# Patient Record
Sex: Female | Born: 1935 | Race: White | Hispanic: No | Smoking: Never smoker
Health system: Southern US, Community
[De-identification: ages and names within clinical notes are randomized; demographics above are authoritative.]

## PROBLEM LIST (undated history)

## (undated) DIAGNOSIS — T148XXA Other injury of unspecified body region, initial encounter: Secondary | ICD-10-CM

## (undated) DIAGNOSIS — E785 Hyperlipidemia, unspecified: Secondary | ICD-10-CM

## (undated) DIAGNOSIS — K219 Gastro-esophageal reflux disease without esophagitis: Secondary | ICD-10-CM

## (undated) DIAGNOSIS — I10 Essential (primary) hypertension: Secondary | ICD-10-CM

## (undated) DIAGNOSIS — D332 Benign neoplasm of brain, unspecified: Secondary | ICD-10-CM

## (undated) DIAGNOSIS — C801 Malignant (primary) neoplasm, unspecified: Secondary | ICD-10-CM

## (undated) DIAGNOSIS — K589 Irritable bowel syndrome without diarrhea: Secondary | ICD-10-CM

## (undated) DIAGNOSIS — E039 Hypothyroidism, unspecified: Secondary | ICD-10-CM

## (undated) DIAGNOSIS — F419 Anxiety disorder, unspecified: Secondary | ICD-10-CM

## (undated) HISTORY — DX: Other injury of unspecified body region, initial encounter: T14.8XXA

## (undated) HISTORY — PX: BREAST LUMPECTOMY: SHX2

## (undated) HISTORY — PX: TONSILLECTOMY: SUR1361

## (undated) HISTORY — PX: COLONOSCOPY: SHX174

## (undated) HISTORY — DX: Benign neoplasm of brain, unspecified: D33.2

## (undated) HISTORY — DX: Hyperlipidemia, unspecified: E78.5

## (undated) HISTORY — DX: Gastro-esophageal reflux disease without esophagitis: K21.9

## (undated) HISTORY — PX: OTHER SURGICAL HISTORY: SHX169

## (undated) HISTORY — PX: BREAST SURGERY: SHX581

## (undated) HISTORY — DX: Malignant (primary) neoplasm, unspecified: C80.1

## (undated) HISTORY — DX: Essential (primary) hypertension: I10

---

## 1981-10-02 HISTORY — PX: CYSTECTOMY: SUR359

## 1989-10-02 HISTORY — PX: THYROIDECTOMY: SHX17

## 2010-10-02 HISTORY — PX: BUNIONECTOMY: SHX129

## 2012-03-19 ENCOUNTER — Other Ambulatory Visit: Payer: Self-pay | Admitting: *Deleted

## 2012-03-19 DIAGNOSIS — R921 Mammographic calcification found on diagnostic imaging of breast: Secondary | ICD-10-CM

## 2012-03-25 ENCOUNTER — Ambulatory Visit
Admission: RE | Admit: 2012-03-25 | Discharge: 2012-03-25 | Disposition: A | Discharge status: Home / Self Care | Payer: Medicare Other | Source: Ambulatory Visit | Attending: *Deleted | Admitting: *Deleted

## 2012-03-25 DIAGNOSIS — R921 Mammographic calcification found on diagnostic imaging of breast: Secondary | ICD-10-CM

## 2012-03-29 ENCOUNTER — Other Ambulatory Visit: Payer: Self-pay | Admitting: *Deleted

## 2012-03-29 DIAGNOSIS — C50911 Malignant neoplasm of unspecified site of right female breast: Secondary | ICD-10-CM

## 2012-04-03 ENCOUNTER — Ambulatory Visit (HOSPITAL_COMMUNITY)
Admission: RE | Admit: 2012-04-03 | Discharge: 2012-04-03 | Disposition: A | Discharge status: Home / Self Care | Payer: Medicare Other | Source: Ambulatory Visit | Attending: *Deleted | Admitting: *Deleted

## 2012-04-03 DIAGNOSIS — C50911 Malignant neoplasm of unspecified site of right female breast: Secondary | ICD-10-CM

## 2012-04-03 DIAGNOSIS — R920 Mammographic microcalcification found on diagnostic imaging of breast: Secondary | ICD-10-CM | POA: Insufficient documentation

## 2012-04-03 DIAGNOSIS — D0590 Unspecified type of carcinoma in situ of unspecified breast: Secondary | ICD-10-CM | POA: Insufficient documentation

## 2012-04-03 MED ORDER — GADOBENATE DIMEGLUMINE 529 MG/ML IV SOLN
15.0000 mL | Freq: Once | INTRAVENOUS | Status: AC | PRN
  Administered 2012-04-03: 15 mL via INTRAVENOUS

## 2012-04-05 ENCOUNTER — Other Ambulatory Visit: Payer: Self-pay | Admitting: *Deleted

## 2012-04-05 DIAGNOSIS — R928 Other abnormal and inconclusive findings on diagnostic imaging of breast: Secondary | ICD-10-CM

## 2012-04-08 ENCOUNTER — Ambulatory Visit (INDEPENDENT_AMBULATORY_CARE_PROVIDER_SITE_OTHER): Payer: Medicare Other | Admitting: General Surgery

## 2012-04-08 ENCOUNTER — Encounter (INDEPENDENT_AMBULATORY_CARE_PROVIDER_SITE_OTHER): Payer: Self-pay | Admitting: General Surgery

## 2012-04-08 VITALS — BP 128/66 | HR 72 | Temp 97.4°F | Resp 16 | Ht 64.5 in | Wt 160.4 lb

## 2012-04-08 DIAGNOSIS — D0591 Unspecified type of carcinoma in situ of right breast: Secondary | ICD-10-CM

## 2012-04-08 DIAGNOSIS — D0590 Unspecified type of carcinoma in situ of unspecified breast: Secondary | ICD-10-CM

## 2012-04-08 NOTE — Progress Notes (Signed)
Patient ID: Angelica Grimes, female   DOB: 07/12/1935, 77 y.o.   MRN: [id number]  Chief Complaint  Patient presents with  . Breast Cancer    HPI Angelica Grimes is a 77 y.o. female.  Referred by Dr. Christiana Pellant HPI 61 yof who is otherwise healthy with history of what she thinks are multiple prior left breast biopsies who presents after undergoing screening mammogram with right sided calcifications.  She has no breast complaints prior to this.  She notes no mass except post biopsy hematoma.  She has since undergone an MR with results listed below.  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 She comes in today to discuss options.  Past Medical History  Diagnosis Date  . Hypertension   . Hyperlipidemia   . Cancer     breast  . Cancer     thyroid  . GERD (gastroesophageal reflux disease)   . Cough     Past Surgical History  Procedure Date  . Bunionectomy 2012    right foot  . Breast surgery 1966, 1991, 1998    biopsy  . Cystectomy 1983  . Thyroidectomy 1991    due to cancer    Family History  Problem Relation Age of Onset  . Stroke Mother   . Pneumonia Mother   . Cancer Father     bladder, prostate  . Cancer Sister     kidney  . Cancer Brother     lung  . Cancer Sister     kidney  . Cancer Sister     lung    Social History History  Substance Use Topics  . Smoking status: Never Smoker   . Smokeless tobacco: Never Used  . Alcohol Use: Yes     rare    Allergies  Allergen Reactions  . Aspirin Nausea Only and Other (See Comments)    Tightness of chest  . Benazepril Swelling    Entire face, lips, throat  . Penicillins Hives, Itching and Rash    Abdominal area only  . Clonidine Derivatives Other (See Comments)    Dryness of mouth, lowers blood pressure    Current Outpatient Prescriptions  Medication Sig Dispense Refill  . atorvastatin (LIPITOR) 20 MG tablet 20 mg daily.       Marland Kitchen BYSTOLIC 5 MG tablet 5 mg daily.        . hydrochlorothiazide (HYDRODIURIL) 25 MG tablet 25 mg daily.       Marland Kitchen omeprazole (PRILOSEC) 20 MG capsule       . potassium chloride SA (K-DUR,KLOR-CON) 20 MEQ tablet 20 mEq daily.       Marland Kitchen SYNTHROID 75 MCG tablet Daily.      . hydrALAZINE (APRESOLINE) 25 MG tablet 25 mg 2 (two) times daily.         Review of Systems Review of Systems  Constitutional: Negative for fever, chills and unexpected weight change.  HENT: Negative for hearing loss, congestion, sore throat, trouble swallowing and voice change.   Eyes: Negative for visual disturbance.  Respiratory: Positive for cough. Negative for wheezing.   Cardiovascular: Negative for chest pain, palpitations and leg swelling.  Gastrointestinal: Negative for nausea, vomiting, abdominal pain, diarrhea, constipation, blood in stool, abdominal distention and anal bleeding.  Genitourinary: Negative for hematuria, vaginal bleeding and difficulty urinating.  Musculoskeletal: Negative for arthralgias.  Skin: Negative for rash and wound.  Neurological: Negative for seizures, syncope and headaches.  Hematological: Negative for adenopathy. Does  not bruise/bleed easily.  Psychiatric/Behavioral: Negative for confusion.    Blood pressure 128/66, pulse 72, temperature 97.4 F (36.3 C), temperature source Temporal, resp. rate 16, height 5' 4.5" (1.638 m), weight 160 lb 6 oz (72.746 kg).  Physical Exam Physical Exam  Vitals reviewed. Constitutional: She appears well-developed and well-nourished.  Eyes: No scleral icterus.  Neck: Neck supple.  Cardiovascular: Normal rate, regular rhythm and normal heart sounds.   Pulmonary/Chest: Effort normal and breath sounds normal. She has no wheezes. She has no rales. Right breast exhibits no inverted nipple, no mass, no nipple discharge, no skin change and no tenderness. Left breast exhibits no inverted nipple, no mass, no nipple discharge, no skin change and no tenderness. Breasts are symmetrical.     Lymphadenopathy:    She has no cervical adenopathy.    Data Reviewed BILATERAL BREAST MRI WITH AND WITHOUT CONTRAST  Technique: Multiplanar, multisequence MR images of both breasts  were obtained prior to and following the intravenous administration  of 15ml of Multihance. Three dimensional images were evaluated at  the independent DynaCad workstation.  Comparison: Bilateral mammogram 03/07/2012 and right diagnostic  mammogram 03/19/2012. Stereotactic biopsy and post-biopsy clip  mammogram 03/25/2012.  Findings: There is a mild background parenchymal enhancement  pattern bilaterally. In the upper central/slightly medial right  breast, anterior to middle thirds is a post biopsy hematoma  measuring 2.4 cm greatest diameter. Biopsy clip artifact is seen  within the biopsy cavity. Clumped enhancement is seen adjacent to  the lateral margin of the biopsy clip, likely reflecting ductal  carcinoma in situ.  In the inferior central and slightly lateral right breast are a  few areas of clumped enhancement, similar to that seen adjacent to  the biopsy cavity. Some of the clumped enhancement is in the  anterior third of the inferior breast and the largest area of  enhancement, spanning approximately 9 mm greatest diameter is seen  in the middle to posterior thirds of the inferior central right  breast. Ductal carcinoma in situ in the inferior breast cannot be  excluded.  There are a few scattered foci of enhancement in the left breast.  No suspicious areas of enhancement to suggest malignancy identified  in the left breast.  No axillary or internal mammary chain lymphadenopathy.  In the dome of the liver is a circumscribed T-2 bright 8 mm lesion  which is statistically likely benign such as a hepatic cyst.  IMPRESSION:  1. Post-biopsy hematoma in the superior right breast at the site  of recent stereotactic biopsy. Small area of clumped enhancement  adjacent to the lateral margin of  the biopsy cavity likely reflects  the patient's biopsy-proven ductal carcinoma in situ (DCIS).  2. Scattered areas of clumped enhancement in the inferior right  breast. DCIS cannot be excluded.  3. No MRI evidence of malignancy in the left breast.  RECOMMENDATION:  MRI guided core needle biopsy of and area of enhancement the  inferior right breast is suggested. The patient will be contacted  to schedule biopsy.   Assessment    RIght breast DCIS    Plan    MR biopsy of other right breast lesion.  We discussed that biopsy of this lesion will direct [place] to most appropriate surgical therapy.  If benign she is good candidate for bct and if positive then I think mastectomy appropriate.  We discussed role of snbx and I will discuss at conference with pathology.  We discussed the staging and pathophysiology of breast cancer.  We discussed all of the different options for treatment for breast cancer including surgery, chemotherapy, radiation therapy, Herceptin, and antiestrogen therapy.   We discussed a sentinel lymph node biopsy as a possibility and what indications what be.  I don't know now if this will be necessary. We discussed the options for treatment of the breast cancer which included lumpectomy versus a mastectomy. We discussed the performance of the lumpectomy with a wire placement. We discussed a 10-20% chance of a positive margin requiring reexcision in the operating room. We also discussed that she may need radiation therapy or antiestrogen therapy or both if she undergoes lumpectomy. We discussed the mastectomy and the postoperative care for that as well. We discussed that there is no difference in her survival whether she undergoes lumpectomy with radiation therapy or antiestrogen therapy versus a mastectomy. There is a slight difference in the local recurrence rate being 3-5% with lumpectomy and about 1% with a mastectomy. We discussed the risks of operation including bleeding,  infection, possible reoperation. She understands her further therapy will be based on what her stages at the time of her operation.       Aalijah Lanphere 04/08/2012, 1:58 PM

## 2012-04-22 ENCOUNTER — Other Ambulatory Visit: Payer: Medicare Other

## 2012-04-22 ENCOUNTER — Ambulatory Visit
Admission: RE | Admit: 2012-04-22 | Discharge: 2012-04-22 | Disposition: A | Discharge status: Home / Self Care | Payer: Medicare Other | Source: Ambulatory Visit | Attending: *Deleted | Admitting: *Deleted

## 2012-04-22 DIAGNOSIS — R928 Other abnormal and inconclusive findings on diagnostic imaging of breast: Secondary | ICD-10-CM

## 2012-04-22 MED ORDER — GADOBENATE DIMEGLUMINE 529 MG/ML IV SOLN
15.0000 mL | Freq: Once | INTRAVENOUS | Status: AC | PRN
  Administered 2012-04-22: 15 mL via INTRAVENOUS

## 2012-04-23 ENCOUNTER — Telehealth (INDEPENDENT_AMBULATORY_CARE_PROVIDER_SITE_OTHER): Payer: Self-pay

## 2012-04-23 NOTE — Telephone Encounter (Signed)
Leigh w/[facility] calling to let us know that the pt's 2nd MRI guided bx of the right breast came back to be benign and the pt is aware. The pt just has the know area in the right breast with DCIS now. The pt wishes to go ahead and get scheduled for surgery now. The pt does not have a f/u appt with Dr Dwain Sarna. I will notify him and see what he advises.

## 2012-04-24 ENCOUNTER — Telehealth (INDEPENDENT_AMBULATORY_CARE_PROVIDER_SITE_OTHER): Payer: Self-pay

## 2012-04-24 ENCOUNTER — Other Ambulatory Visit (INDEPENDENT_AMBULATORY_CARE_PROVIDER_SITE_OTHER): Payer: Self-pay | Admitting: General Surgery

## 2012-04-24 DIAGNOSIS — D0510 Intraductal carcinoma in situ of unspecified breast: Secondary | ICD-10-CM

## 2012-04-24 NOTE — Telephone Encounter (Signed)
Called pt to let her know that Dr Dwain Sarna did go ahead and complete the surgical orders to get her scheduled for the right lumpectomy. The pt wishes to go ahead and schedule the surgery just not on Aug.2-7. Dr Dwain Sarna will call the pt on Friday to make sure she has no other questions regarding the surgery. The pt understands and waits to hear from our schedulers.

## 2012-05-13 MED ORDER — VANCOMYCIN HCL IN DEXTROSE 1-5 GM/200ML-% IV SOLN
INTRAVENOUS | Status: AC
  Filled 2012-05-13: qty 200

## 2012-05-15 ENCOUNTER — Encounter (HOSPITAL_BASED_OUTPATIENT_CLINIC_OR_DEPARTMENT_OTHER): Payer: Self-pay | Admitting: *Deleted

## 2012-05-15 NOTE — Progress Notes (Signed)
To come in for labs and ekg To bring all meds and overnight bag to stay [facility] since she lives in [place]. No cardiac -did have some bronchitis 5/13-better-not generally any resp problems-never smoked.

## 2012-05-17 ENCOUNTER — Other Ambulatory Visit: Payer: Self-pay

## 2012-05-17 ENCOUNTER — Encounter (HOSPITAL_BASED_OUTPATIENT_CLINIC_OR_DEPARTMENT_OTHER)
Admission: RE | Admit: 2012-05-17 | Discharge: 2012-05-17 | Disposition: A | Discharge status: Home / Self Care | Payer: Medicare Other | Source: Ambulatory Visit | Attending: General Surgery | Admitting: General Surgery

## 2012-05-17 LAB — CBC WITH DIFFERENTIAL/PLATELET
Basophils Absolute: 0 10*3/uL (ref 0.0–0.1)
Basophils Relative: 0 % (ref 0–1)
Eosinophils Absolute: 0.1 10*3/uL (ref 0.0–0.7)
HCT: 36.3 % (ref 36.0–46.0)
MCH: 29.2 pg (ref 26.0–34.0)
MCHC: 34.2 g/dL (ref 30.0–36.0)
Monocytes Absolute: 0.6 10*3/uL (ref 0.1–1.0)
Neutro Abs: 4.2 10*3/uL (ref 1.7–7.7)
Neutrophils Relative %: 58 % (ref 43–77)
RDW: 13.5 % (ref 11.5–15.5)

## 2012-05-17 LAB — BASIC METABOLIC PANEL
BUN: 11 mg/dL (ref 6–23)
Creatinine, Ser: 0.83 mg/dL (ref 0.50–1.10)
GFR calc Af Amer: 79 mL/min — ABNORMAL LOW (ref >90)
GFR calc non Af Amer: 68 mL/min — ABNORMAL LOW (ref >90)
Glucose, Bld: 102 mg/dL — ABNORMAL HIGH (ref 70–99)

## 2012-05-17 NOTE — Progress Notes (Signed)
EKG reviewed with Dr. Gelene Mink when completed.  OK to proceed without additional testing.

## 2012-05-21 ENCOUNTER — Encounter (HOSPITAL_BASED_OUTPATIENT_CLINIC_OR_DEPARTMENT_OTHER): Payer: Self-pay | Admitting: Anesthesiology

## 2012-05-21 ENCOUNTER — Ambulatory Visit
Admission: RE | Admit: 2012-05-21 | Discharge: 2012-05-21 | Disposition: A | Discharge status: Home / Self Care | Payer: Medicare Other | Source: Ambulatory Visit | Attending: General Surgery | Admitting: General Surgery

## 2012-05-21 ENCOUNTER — Ambulatory Visit (HOSPITAL_BASED_OUTPATIENT_CLINIC_OR_DEPARTMENT_OTHER)
Admission: RE | Admit: 2012-05-21 | Discharge: 2012-05-22 | Disposition: A | Discharge status: Home / Self Care | Payer: Medicare Other | Source: Ambulatory Visit | Attending: General Surgery | Admitting: General Surgery

## 2012-05-21 ENCOUNTER — Encounter (HOSPITAL_BASED_OUTPATIENT_CLINIC_OR_DEPARTMENT_OTHER)
Admission: RE | Disposition: A | Discharge status: Home / Self Care | Payer: Self-pay | Source: Ambulatory Visit | Attending: General Surgery

## 2012-05-21 ENCOUNTER — Encounter (HOSPITAL_BASED_OUTPATIENT_CLINIC_OR_DEPARTMENT_OTHER): Payer: Self-pay | Admitting: *Deleted

## 2012-05-21 ENCOUNTER — Ambulatory Visit (HOSPITAL_BASED_OUTPATIENT_CLINIC_OR_DEPARTMENT_OTHER): Payer: Medicare Other | Admitting: Anesthesiology

## 2012-05-21 ENCOUNTER — Encounter (HOSPITAL_BASED_OUTPATIENT_CLINIC_OR_DEPARTMENT_OTHER): Payer: Self-pay | Admitting: Certified Registered"

## 2012-05-21 DIAGNOSIS — Z85850 Personal history of malignant neoplasm of thyroid: Secondary | ICD-10-CM | POA: Insufficient documentation

## 2012-05-21 DIAGNOSIS — D0590 Unspecified type of carcinoma in situ of unspecified breast: Secondary | ICD-10-CM | POA: Insufficient documentation

## 2012-05-21 DIAGNOSIS — E039 Hypothyroidism, unspecified: Secondary | ICD-10-CM | POA: Insufficient documentation

## 2012-05-21 DIAGNOSIS — Z01812 Encounter for preprocedural laboratory examination: Secondary | ICD-10-CM | POA: Insufficient documentation

## 2012-05-21 DIAGNOSIS — D0510 Intraductal carcinoma in situ of unspecified breast: Secondary | ICD-10-CM

## 2012-05-21 DIAGNOSIS — K219 Gastro-esophageal reflux disease without esophagitis: Secondary | ICD-10-CM | POA: Insufficient documentation

## 2012-05-21 DIAGNOSIS — I10 Essential (primary) hypertension: Secondary | ICD-10-CM | POA: Insufficient documentation

## 2012-05-21 DIAGNOSIS — Z01810 Encounter for preprocedural cardiovascular examination: Secondary | ICD-10-CM | POA: Insufficient documentation

## 2012-05-21 HISTORY — DX: Irritable bowel syndrome, unspecified: K58.9

## 2012-05-21 LAB — GLUCOSE, CAPILLARY: Glucose-Capillary: 209 mg/dL — ABNORMAL HIGH (ref 70–99)

## 2012-05-21 SURGERY — BREAST LUMPECTOMY WITH NEEDLE LOCALIZATION
Anesthesia: General | Site: Breast | Laterality: Right | Wound class: Clean

## 2012-05-21 MED ORDER — METOCLOPRAMIDE HCL 5 MG/ML IJ SOLN: 10.0000 mg | Freq: Once | INTRAMUSCULAR | Status: AC | PRN

## 2012-05-21 MED ORDER — NEBIVOLOL HCL 5 MG PO TABS: 5.0000 mg | ORAL_TABLET | Freq: Every day | ORAL | Status: DC

## 2012-05-21 MED ORDER — DEXAMETHASONE SODIUM PHOSPHATE 4 MG/ML IJ SOLN
INTRAMUSCULAR | Status: DC | PRN
  Administered 2012-05-21: 10 mg via INTRAVENOUS

## 2012-05-21 MED ORDER — MORPHINE SULFATE 2 MG/ML IJ SOLN: 2.0000 mg | INTRAMUSCULAR | Status: DC | PRN

## 2012-05-21 MED ORDER — OXYCODONE HCL 5 MG PO TABS
5.0000 mg | ORAL_TABLET | Freq: Once | ORAL | Status: AC | PRN
  Administered 2012-05-21: 5 mg via ORAL

## 2012-05-21 MED ORDER — SODIUM CHLORIDE 0.9 % IV SOLN
INTRAVENOUS | Status: DC
  Administered 2012-05-21: 14:00:00 via INTRAVENOUS

## 2012-05-21 MED ORDER — CIPROFLOXACIN IN D5W 400 MG/200ML IV SOLN
400.0000 mg | INTRAVENOUS | Status: AC
  Administered 2012-05-21: 400 mg via INTRAVENOUS

## 2012-05-21 MED ORDER — HYDROMORPHONE HCL PF 1 MG/ML IJ SOLN: 0.2500 mg | INTRAMUSCULAR | Status: DC | PRN

## 2012-05-21 MED ORDER — FENTANYL CITRATE 0.05 MG/ML IJ SOLN
INTRAMUSCULAR | Status: DC | PRN
  Administered 2012-05-21: 50 ug via INTRAVENOUS
  Administered 2012-05-21: 25 ug via INTRAVENOUS

## 2012-05-21 MED ORDER — LACTATED RINGERS IV SOLN
INTRAVENOUS | Status: DC
  Administered 2012-05-21: 11:00:00 via INTRAVENOUS

## 2012-05-21 MED ORDER — HYDRALAZINE HCL 25 MG PO TABS: 25.0000 mg | ORAL_TABLET | Freq: Two times a day (BID) | ORAL | Status: DC

## 2012-05-21 MED ORDER — LIDOCAINE HCL (CARDIAC) 20 MG/ML IV SOLN
INTRAVENOUS | Status: DC | PRN
  Administered 2012-05-21: 50 mg via INTRAVENOUS

## 2012-05-21 MED ORDER — ONDANSETRON HCL 4 MG/2ML IJ SOLN: 4.0000 mg | Freq: Four times a day (QID) | INTRAMUSCULAR | Status: DC | PRN

## 2012-05-21 MED ORDER — METOCLOPRAMIDE HCL 5 MG/ML IJ SOLN
INTRAMUSCULAR | Status: DC | PRN
  Administered 2012-05-21: 10 mg via INTRAVENOUS

## 2012-05-21 MED ORDER — MIDAZOLAM HCL 5 MG/5ML IJ SOLN
INTRAMUSCULAR | Status: DC | PRN
  Administered 2012-05-21: 1 mg via INTRAVENOUS

## 2012-05-21 MED ORDER — ACETAMINOPHEN 650 MG RE SUPP: 650.0000 mg | Freq: Four times a day (QID) | RECTAL | Status: DC | PRN

## 2012-05-21 MED ORDER — OXYCODONE HCL 5 MG PO TABS: 5.0000 mg | ORAL_TABLET | ORAL | Status: DC | PRN

## 2012-05-21 MED ORDER — OXYCODONE HCL 5 MG/5ML PO SOLN: 5.0000 mg | Freq: Once | ORAL | Status: AC | PRN

## 2012-05-21 MED ORDER — ACETAMINOPHEN 325 MG PO TABS: 650.0000 mg | ORAL_TABLET | Freq: Four times a day (QID) | ORAL | Status: DC | PRN

## 2012-05-21 MED ORDER — HYDROCHLOROTHIAZIDE 25 MG PO TABS: 25.0000 mg | ORAL_TABLET | Freq: Every day | ORAL | Status: DC

## 2012-05-21 MED ORDER — ACETAMINOPHEN 10 MG/ML IV SOLN
1000.0000 mg | Freq: Once | INTRAVENOUS | Status: AC
  Administered 2012-05-21: 1000 mg via INTRAVENOUS

## 2012-05-21 MED ORDER — ONDANSETRON HCL 4 MG/2ML IJ SOLN
INTRAMUSCULAR | Status: DC | PRN
  Administered 2012-05-21: 4 mg via INTRAVENOUS

## 2012-05-21 MED ORDER — BUPIVACAINE HCL (PF) 0.25 % IJ SOLN
INTRAMUSCULAR | Status: DC | PRN
  Administered 2012-05-21: 20 mL

## 2012-05-21 MED ORDER — PROPOFOL 10 MG/ML IV EMUL
INTRAVENOUS | Status: DC | PRN
  Administered 2012-05-21: 200 mg via INTRAVENOUS

## 2012-05-21 SURGICAL SUPPLY — 53 items
APPLIER CLIP 9.375 MED OPEN (MISCELLANEOUS)
BENZOIN TINCTURE PRP APPL 2/3 (GAUZE/BANDAGES/DRESSINGS) ×2 IMPLANT
BINDER BREAST LRG (GAUZE/BANDAGES/DRESSINGS) ×2 IMPLANT
BINDER BREAST MEDIUM (GAUZE/BANDAGES/DRESSINGS) IMPLANT
BINDER BREAST XLRG (GAUZE/BANDAGES/DRESSINGS) IMPLANT
BINDER BREAST XXLRG (GAUZE/BANDAGES/DRESSINGS) IMPLANT
BLADE SURG 15 STRL LF DISP TIS (BLADE) ×1 IMPLANT
BLADE SURG 15 STRL SS (BLADE) ×1
CANISTER SUCTION 1200CC (MISCELLANEOUS) IMPLANT
CHLORAPREP W/TINT 26ML (MISCELLANEOUS) ×2 IMPLANT
CLIP APPLIE 9.375 MED OPEN (MISCELLANEOUS) IMPLANT
CLOTH BEACON ORANGE TIMEOUT ST (SAFETY) ×2 IMPLANT
COVER MAYO STAND STRL (DRAPES) ×2 IMPLANT
COVER TABLE BACK 60X90 (DRAPES) ×2 IMPLANT
DECANTER SPIKE VIAL GLASS SM (MISCELLANEOUS) IMPLANT
DERMABOND ADVANCED (GAUZE/BANDAGES/DRESSINGS)
DERMABOND ADVANCED .7 DNX12 (GAUZE/BANDAGES/DRESSINGS) IMPLANT
DEVICE DUBIN W/COMP PLATE 8390 (MISCELLANEOUS) ×2 IMPLANT
DRAPE PED LAPAROTOMY (DRAPES) ×2 IMPLANT
DRSG TEGADERM 4X4.75 (GAUZE/BANDAGES/DRESSINGS) ×2 IMPLANT
ELECT COATED BLADE 2.86 ST (ELECTRODE) ×2 IMPLANT
ELECT REM PT RETURN 9FT ADLT (ELECTROSURGICAL) ×2
ELECTRODE REM PT RTRN 9FT ADLT (ELECTROSURGICAL) ×1 IMPLANT
GAUZE SPONGE 4X4 12PLY STRL LF (GAUZE/BANDAGES/DRESSINGS) ×2 IMPLANT
GLOVE BIO SURGEON STRL SZ7 (GLOVE) ×4 IMPLANT
GLOVE BIOGEL PI IND STRL 7.5 (GLOVE) ×1 IMPLANT
GLOVE BIOGEL PI INDICATOR 7.5 (GLOVE) ×1
GLOVE SKINSENSE NS SZ7.0 (GLOVE) ×1
GLOVE SKINSENSE STRL SZ7.0 (GLOVE) ×1 IMPLANT
GOWN PREVENTION PLUS XLARGE (GOWN DISPOSABLE) ×4 IMPLANT
KIT MARKER MARGIN INK (KITS) ×2 IMPLANT
NEEDLE HYPO 25X1 1.5 SAFETY (NEEDLE) ×2 IMPLANT
NS IRRIG 1000ML POUR BTL (IV SOLUTION) ×2 IMPLANT
PACK BASIN DAY SURGERY FS (CUSTOM PROCEDURE TRAY) ×2 IMPLANT
PENCIL BUTTON HOLSTER BLD 10FT (ELECTRODE) ×2 IMPLANT
SLEEVE SCD COMPRESS KNEE MED (MISCELLANEOUS) ×2 IMPLANT
SPONGE LAP 4X18 X RAY DECT (DISPOSABLE) ×2 IMPLANT
STRIP CLOSURE SKIN 1/2X4 (GAUZE/BANDAGES/DRESSINGS) ×2 IMPLANT
SUT MNCRL AB 4-0 PS2 18 (SUTURE) IMPLANT
SUT MON AB 5-0 PS2 18 (SUTURE) IMPLANT
SUT SILK 2 0 SH (SUTURE) IMPLANT
SUT VIC AB 2-0 SH 27 (SUTURE) ×1
SUT VIC AB 2-0 SH 27XBRD (SUTURE) ×1 IMPLANT
SUT VIC AB 3-0 SH 27 (SUTURE) ×1
SUT VIC AB 3-0 SH 27X BRD (SUTURE) ×1 IMPLANT
SUT VIC AB 5-0 PS2 18 (SUTURE) ×2 IMPLANT
SUT VICRYL AB 3 0 TIES (SUTURE) IMPLANT
SYR CONTROL 10ML LL (SYRINGE) ×2 IMPLANT
TOWEL OR 17X24 6PK STRL BLUE (TOWEL DISPOSABLE) ×2 IMPLANT
TOWEL OR NON WOVEN STRL DISP B (DISPOSABLE) ×2 IMPLANT
TUBE CONNECTING 20X1/4 (TUBING) IMPLANT
WATER STERILE IRR 1000ML POUR (IV SOLUTION) ×2 IMPLANT
YANKAUER SUCT BULB TIP NO VENT (SUCTIONS) IMPLANT

## 2012-05-21 NOTE — Anesthesia Postprocedure Evaluation (Signed)
Anesthesia Post Note  Patient: Angelica Grimes  Procedure(s) Performed: Procedure(s) (LRB): BREAST LUMPECTOMY WITH NEEDLE LOCALIZATION (Right)  Anesthesia type: General  Patient location: PACU  Post pain: Pain level controlled  Post assessment: Patient's Cardiovascular Status Stable  Last Vitals:  Filed Vitals:   05/21/12 1358  BP: 170/77  Pulse: 63  Temp: 36.8 C  Resp: 18    Post vital signs: Reviewed and stable  Level of consciousness: alert  Complications: No apparent anesthesia complications

## 2012-05-21 NOTE — H&P (Signed)
HPI  46 yof who is otherwise healthy with history of what Angelica Grimes thinks are multiple prior left breast biopsies who presents after undergoing screening mammogram with right sided calcifications. Angelica Grimes has no breast complaints prior to this. Angelica Grimes notes no mass except post biopsy hematoma. Angelica Grimes has since undergone an MR with results listed below. 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 Angelica Grimes has undergone additional MR biopsy that is benign.  We have discussed options and decided to proceed with lumpectomy.  Past Medical History   Diagnosis  Date   .  Hypertension    .  Hyperlipidemia    .  Cancer      breast   .  Cancer      thyroid   .  GERD (gastroesophageal reflux disease)    .  Cough     Past Surgical History   Procedure  Date   .  Bunionectomy  2012     right foot   .  Breast surgery  1966, 1991, 1998     biopsy   .  Cystectomy  1983   .  Thyroidectomy  1991     due to cancer    Social History  History   Substance Use Topics   .  Smoking status:  Never Smoker   .  Smokeless tobacco:  Never Used   .  Alcohol Use:  Yes      rare    Allergies   Allergen  Reactions   .  Aspirin  Nausea Only and Other (See Comments)     Tightness of chest   .  Benazepril  Swelling     Entire face, lips, throat   .  Penicillins  Hives, Itching and Rash     Abdominal area only   .  Clonidine Derivatives  Other (See Comments)     Dryness of mouth, lowers blood pressure    Current Outpatient Prescriptions   Medication  Sig  Dispense  Refill   .  atorvastatin (LIPITOR) 20 MG tablet  20 mg daily.     Marland Kitchen  BYSTOLIC 5 MG tablet  5 mg daily.     .  hydrochlorothiazide (HYDRODIURIL) 25 MG tablet  25 mg daily.     Marland Kitchen  omeprazole (PRILOSEC) 20 MG capsule      .  potassium chloride SA (K-DUR,KLOR-CON) 20 MEQ tablet  20 mEq daily.     Marland Kitchen  SYNTHROID 75 MCG tablet  Daily.     .  hydrALAZINE (APRESOLINE) 25 MG tablet  25 mg 2 (two) times daily.       Blood pressure  128/66, pulse 72, temperature 97.4 F (36.3 C), temperature source Temporal, resp. rate 16, height 5' 4.5" (1.638 m), weight 160 lb 6 oz (72.746 kg).  Physical Exam  Physical Exam  Vitals reviewed.  Constitutional: Angelica Grimes appears well-developed and well-nourished.   Right breast exhibits no inverted nipple, no mass, no nipple discharge, no skin change and no tenderness. Left breast exhibits no inverted nipple, no mass, no nipple discharge, no skin change and no tenderness. Breasts are symmetrical.    Data Reviewed  BILATERAL BREAST MRI WITH AND WITHOUT CONTRAST  Technique: Multiplanar, multisequence MR images of both breasts  were obtained prior to and following the intravenous administration  of 15ml of Multihance. Three dimensional images were evaluated at  the independent DynaCad workstation.  Comparison: Bilateral mammogram 03/07/2012 and right diagnostic  mammogram 03/19/2012. Stereotactic biopsy  and post-biopsy clip  mammogram 03/25/2012.  Findings: There is a mild background parenchymal enhancement  pattern bilaterally. In the upper central/slightly medial right  breast, anterior to middle thirds is a post biopsy hematoma  measuring 2.4 cm greatest diameter. Biopsy clip artifact is seen  within the biopsy cavity. Clumped enhancement is seen adjacent to  the lateral margin of the biopsy clip, likely reflecting ductal  carcinoma in situ.  In the inferior central and slightly lateral right breast are a  few areas of clumped enhancement, similar to that seen adjacent to  the biopsy cavity. Some of the clumped enhancement is in the  anterior third of the inferior breast and the largest area of  enhancement, spanning approximately 9 mm greatest diameter is seen  in the middle to posterior thirds of the inferior central right  breast. Ductal carcinoma in situ in the inferior breast cannot be  excluded.  There are a few scattered foci of enhancement in the left breast.  No suspicious  areas of enhancement to suggest malignancy identified  in the left breast.  No axillary or internal mammary chain lymphadenopathy.  In the dome of the liver is a circumscribed T-2 bright 8 mm lesion  which is statistically likely benign such as a hepatic cyst.  IMPRESSION:  1. Post-biopsy hematoma in the superior right breast at the site  of recent stereotactic biopsy. Small area of clumped enhancement  adjacent to the lateral margin of the biopsy cavity likely reflects  the patient's biopsy-proven ductal carcinoma in situ (DCIS).  2. Scattered areas of clumped enhancement in the inferior right  breast. DCIS cannot be excluded.  3. No MRI evidence of malignancy in the left breast.  RECOMMENDATION:  MRI guided core needle biopsy of and area of enhancement the  inferior right breast is suggested. The patient will be contacted  to schedule biopsy.  Assessment   RIght breast DCIS   Plan   Right breast lumpectomy. We discussed the options for treatment of the breast cancer which included lumpectomy versus a mastectomy. We discussed the performance of the lumpectomy with a wire placement. We discussed a 10-20% chance of a positive margin requiring reexcision in the operating room. We also discussed that Angelica Grimes may need radiation therapy or antiestrogen therapy or both if Angelica Grimes undergoes lumpectomy. We discussed the mastectomy and the postoperative care for that as well. We discussed that there is no difference in her survival whether Angelica Grimes undergoes lumpectomy with radiation therapy or antiestrogen therapy versus a mastectomy. There is a slight difference in the local recurrence rate being 3-5% with lumpectomy and about 1% with a mastectomy. We discussed the risks of operation including bleeding, infection, possible reoperation. Angelica Grimes understands her further therapy will be based on what her stages at the time of her operation.

## 2012-05-21 NOTE — Op Note (Signed)
Preoperative diagnosis: Stage 0 right breast cancer Postoperative diagnosis: Same as above Procedure: Right breast wire-guided lumpectomy Surgeon: Dr. Harden Mo Anesthesia: Gen. Estimated blood loss: Minimal Specimens: Right breast tissue marked with paint,  additional anterior margin unmarked Complications: None Drains: None Sponge needle count was correct x2 at end of operation Disposition to recovery in stable condition  Indications: This is a 77 year old female who presented with a mammographic abnormality. She underwent a biopsy showed high-grade ductal carcinoma in situ. She was sent for an MRI that showed another abnormality on that same side. It also showed the postbiopsy hematoma. This other area underwent biopsy showing benign breast tissue it was recommended to follow up in 6 months. The patient I discussed all of her options and decided to proceed with a lumpectomy.  Procedure: After informed consent was obtained the patient was taken to the operating room. She first had a wire placed by Dr. Deboraha Sprang by the breast center. She was then administered 400 mg of IV ciprofloxacin. I had the mammograms available for my review in the operating room. She had sequential compression devices placed on her legs. She was in placed under general anesthesia with an LMA. Her right breast was prepped and draped in the standard sterile surgical fashion. A surgical timeout was performed.  I then made a curvilinear incision overlying the tract of the wire. I then used cautery to dissect underneath the skin to bring the wire in remotely. The anterior margin of this is the skin. I then used cautery to remove the wire and the surrounding breast tissue. This was not down to the pectoralis fascia as I thought I was clear of that. I then took this out and marked it with paint. A Faxitron mammogram was taken confirming removal of the lesion and the clip. I did again take some more anterior margin so her anterior  margin is a linear skin incision were tissue to remove in this location except for skin. This was confirmed by radiology. I placed 2 clips deep. I placed one clip in each position of the cavity. Hemostasis was observed. I then closed with 2-0 Vicryl, 3-0 Vicryl, and 4-0 Monocryl. I then infiltrated 20 cc of quarter percent Marcaine. Steri-Strips and a sterile dressing were placed. A breast binder was placed. She tolerated this well transferred recovery stable.

## 2012-05-21 NOTE — Interval H&P Note (Signed)
History and Physical Interval Note:  05/21/2012 11:35 AM  Angelica Grimes  has presented today for surgery, with the diagnosis of remove right breast cancer  The various methods of treatment have been discussed with the patient and family. After consideration of risks, benefits and other options for treatment, the patient has consented to  Procedure(s) (LRB): BREAST LUMPECTOMY WITH NEEDLE LOCALIZATION (Right) as a surgical intervention .  The patient's history has been reviewed, patient examined, no change in status, stable for surgery.  I have reviewed the patient's chart and labs.  Questions were answered to the patient's satisfaction.     Annalucia Laino

## 2012-05-21 NOTE — Transfer of Care (Signed)
Immediate Anesthesia Transfer of Care Note  Patient: Angelica Grimes  Procedure(s) Performed: Procedure(s) (LRB): BREAST LUMPECTOMY WITH NEEDLE LOCALIZATION (Right)  Patient Location: PACU  Anesthesia Type: General  Level of Consciousness: awake, alert , oriented and patient cooperative  Airway & Oxygen Therapy: Patient Spontanous Breathing and Patient connected to face mask oxygen  Post-op Assessment: Report given to PACU RN and Post -op Vital signs reviewed and stable  Post vital signs: Reviewed and stable  Complications: No apparent anesthesia complications

## 2012-05-21 NOTE — Anesthesia Preprocedure Evaluation (Signed)
Anesthesia Evaluation  Patient identified by MRN, date of birth, ID band Patient awake    Reviewed: Allergy & Precautions, H&P , NPO status , Patient's Chart, lab work & pertinent test results, reviewed documented beta blocker date and time   Airway Mallampati: II TM Distance: >3 FB Neck ROM: full    Dental   Pulmonary neg pulmonary ROS,  breath sounds clear to auscultation        Cardiovascular hypertension, On Medications and On Home Beta Blockers Rhythm:regular     Neuro/Psych negative neurological ROS  negative psych ROS   GI/Hepatic Neg liver ROS, GERD-  Medicated and Controlled,  Endo/Other  negative endocrine ROSHypothyroidism   Renal/GU negative Renal ROS  negative genitourinary   Musculoskeletal   Abdominal   Peds  Hematology negative hematology ROS (+)   Anesthesia Other Findings See surgeon's H&P   Reproductive/Obstetrics negative OB ROS                           Anesthesia Physical Anesthesia Plan  ASA: III  Anesthesia Plan: General   Post-op Pain Management:    Induction: Intravenous  Airway Management Planned: LMA  Additional Equipment:   Intra-op Plan:   Post-operative Plan: Extubation in OR  Informed Consent: I have reviewed the patients History and Physical, chart, labs and discussed the procedure including the risks, benefits and alternatives for the proposed anesthesia with the patient or authorized representative who has indicated his/her understanding and acceptance.   Dental Advisory Given  Plan Discussed with: CRNA and Surgeon  Anesthesia Plan Comments:         Anesthesia Quick Evaluation

## 2012-05-22 LAB — GLUCOSE, CAPILLARY: Glucose-Capillary: 118 mg/dL — ABNORMAL HIGH (ref 70–99)

## 2012-05-22 MED ORDER — OXYCODONE-ACETAMINOPHEN 5-325 MG PO TABS: 1.0000 | ORAL_TABLET | ORAL | Status: AC | PRN

## 2012-05-22 NOTE — Discharge Summary (Signed)
Physician Discharge Summary  Patient ID: Angelica Grimes MRN: [id number] DOB/AGE: 04/15/1935 77 y.o.  Admit date: 05/21/2012 Discharge date: 05/22/2012  Admission Diagnoses: Hypertension DCIS Discharge Diagnoses:  Same as above  Discharged Condition: good  Hospital Course: 30 yof admitted after lumpectomy for dcis.  Doing well first postop morning and will be discharged.  Consults: None  Significant Diagnostic Studies: none  Treatments: surgery: wire guided lumpectomy  Discharge Exam: Blood pressure 171/81, pulse 81, temperature 98 F (36.7 C), temperature source Oral, resp. rate 18, height 5\' 4"  (1.626 m), weight 158 lb 4 oz (71.782 kg), SpO2 98.00%. Incision/Wound:dressing dry  Disposition: home  Medication List  As of 05/22/2012  6:16 AM   TAKE these medications         atorvastatin 20 MG tablet   Commonly known as: LIPITOR   20 mg daily.      BYSTOLIC 5 MG tablet   Generic drug: nebivolol   5 mg daily.      hydrALAZINE 25 MG tablet   Commonly known as: APRESOLINE   25 mg 2 (two) times daily.      hydrochlorothiazide 25 MG tablet   Commonly known as: HYDRODIURIL   25 mg daily.      omeprazole 20 MG capsule   Commonly known as: PRILOSEC      oxyCODONE-acetaminophen 5-325 MG per tablet   Commonly known as: PERCOCET/ROXICET   Take 1 tablet by mouth every 4 (four) hours as needed for pain.      potassium chloride SA 20 MEQ tablet   Commonly known as: K-DUR,KLOR-CON   20 mEq daily.      SYNTHROID 75 MCG tablet   Generic drug: levothyroxine   Daily.           Follow-up Information    Follow up with Emelia Loron, MD.   Contact information:   [address] [place] [address] [phone]          Signed: Emelia Loron 05/22/2012, 6:16 AM

## 2012-05-27 ENCOUNTER — Other Ambulatory Visit (INDEPENDENT_AMBULATORY_CARE_PROVIDER_SITE_OTHER): Payer: Self-pay | Admitting: General Surgery

## 2012-05-27 ENCOUNTER — Telehealth (INDEPENDENT_AMBULATORY_CARE_PROVIDER_SITE_OTHER): Payer: Self-pay | Admitting: General Surgery

## 2012-05-27 NOTE — Telephone Encounter (Signed)
I called Ms. Angelica Grimes today and we discussed pathology.  She has two close margins and does not meet nccn criteria for dcis at one of these.  I discussed with her that she does not have invasive disease.  I recommended returning to or as we had discussed previously for a re-excision.  We again discussed there was possibility we could continue to have further close or positive margins necessitating more surgery.  We discussed possibility of mastectomy but she would like another attempt at lumpectomy.  Her daughter is going to be gone until 15 September and she would like to schedule after that.  I will see her in office prior to that.

## 2012-06-05 ENCOUNTER — Telehealth (INDEPENDENT_AMBULATORY_CARE_PROVIDER_SITE_OTHER): Payer: Self-pay | Admitting: General Surgery

## 2012-06-05 NOTE — Telephone Encounter (Signed)
Pt calling to ask if OK to remove the elastic from her breast now, to wear her regular bra.  Explained the elastic was for comfort in the post op period, to prevent the usual movement of her breast.  Since her surgery was nearly two weeks ago, it would be OK to try wearing her bra.  She understands and will use the elastic prn.

## 2012-06-12 ENCOUNTER — Encounter (HOSPITAL_COMMUNITY): Payer: Self-pay | Admitting: Pharmacy Technician

## 2012-06-17 ENCOUNTER — Ambulatory Visit (INDEPENDENT_AMBULATORY_CARE_PROVIDER_SITE_OTHER): Payer: Medicare Other | Admitting: General Surgery

## 2012-06-17 ENCOUNTER — Encounter (INDEPENDENT_AMBULATORY_CARE_PROVIDER_SITE_OTHER): Payer: Self-pay | Admitting: General Surgery

## 2012-06-17 ENCOUNTER — Encounter (HOSPITAL_COMMUNITY): Payer: Self-pay

## 2012-06-17 ENCOUNTER — Ambulatory Visit (HOSPITAL_COMMUNITY)
Admission: RE | Admit: 2012-06-17 | Discharge: 2012-06-17 | Disposition: A | Discharge status: Home / Self Care | Payer: Medicare Other | Source: Ambulatory Visit | Attending: General Surgery | Admitting: General Surgery

## 2012-06-17 ENCOUNTER — Encounter (HOSPITAL_COMMUNITY)
Admission: RE | Admit: 2012-06-17 | Discharge: 2012-06-17 | Disposition: A | Discharge status: Home / Self Care | Payer: Medicare Other | Source: Ambulatory Visit | Attending: General Surgery | Admitting: General Surgery

## 2012-06-17 VITALS — BP 132/70 | HR 60 | Temp 97.6°F | Resp 16 | Ht 64.5 in | Wt 151.6 lb

## 2012-06-17 DIAGNOSIS — Z09 Encounter for follow-up examination after completed treatment for conditions other than malignant neoplasm: Secondary | ICD-10-CM

## 2012-06-17 HISTORY — DX: Hypothyroidism, unspecified: E03.9

## 2012-06-17 HISTORY — DX: Anxiety disorder, unspecified: F41.9

## 2012-06-17 LAB — BASIC METABOLIC PANEL
Chloride: 100 mEq/L (ref 96–112)
GFR calc Af Amer: 71 mL/min — ABNORMAL LOW (ref >90)
GFR calc non Af Amer: 61 mL/min — ABNORMAL LOW (ref >90)
Glucose, Bld: 91 mg/dL (ref 70–99)
Potassium: 3.5 mEq/L (ref 3.5–5.1)
Sodium: 139 mEq/L (ref 135–145)

## 2012-06-17 LAB — CBC
Hemoglobin: 11.9 g/dL — ABNORMAL LOW (ref 12.0–15.0)
MCHC: 33.1 g/dL (ref 30.0–36.0)
RDW: 13.6 % (ref 11.5–15.5)
WBC: 6 10*3/uL (ref 4.0–10.5)

## 2012-06-17 LAB — SURGICAL PCR SCREEN: Staphylococcus aureus: NEGATIVE

## 2012-06-17 NOTE — Progress Notes (Signed)
Subjective:     Patient ID: Angelica Grimes, female   DOB: 09/29/1935, 76 y.o.   MRN: [id number]  HPI 76 yof s/p right breast lumpectomy for dcis with 2 close margins.  She has done well and returns today prior to re-excision.  She and I have discussed pathology over the phone and are scheduled for re-excision lumpectomy tomorrow.  She reports no real complaints.  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 I think this should be reasonable given to close margins that are just focally closed. She understands that there may be additional positive margins requiring additional surgery.      

## 2012-06-17 NOTE — Patient Instructions (Signed)
20 Angelica Grimes  06/17/2012   Your procedure is scheduled on:  06/18/12 145pm-245pm  Report to [facility] at 1115 AM.  Call this number if you have problems the morning of surgery: [phone]   Remember:   Do not eat food:After Midnight.  May have clear liquids:until 0700am then npo .  Clear liquids include soda, tea, black coffee, apple or grape juice, broth.  Take these medicines the morning of surgery with A SIP OF WATER:    Do not wear jewelry, make-up or nail polish.  Do not wear lotions, powders, or perfumes. .  Do not shave 48 hours prior to surgery.   Do not bring valuables to the hospital.  Contacts, dentures or bridgework may not be worn into surgery.       Patients discharged the day of surgery will not be allowed to drive home.  Name and phone number of your driver  Special Instructions: CHG Shower Use Special Wash: 1/2 bottle night before surgery and 1/2 bottle morning of surgery. Shower chin to toes with CHG.  Wash face and private parts with regular soap.   Please read over the following fact sheets that you were given: MRSA Information, coughing and deep brething exercises, leg exercises

## 2012-06-17 NOTE — Progress Notes (Signed)
No antibiotic has been ordered for patient 's surgery on 06/18/12.  Just wanted to let you know

## 2012-06-18 ENCOUNTER — Encounter (HOSPITAL_COMMUNITY)
Admission: RE | Disposition: A | Discharge status: Home / Self Care | Payer: Self-pay | Source: Ambulatory Visit | Attending: General Surgery

## 2012-06-18 ENCOUNTER — Encounter (HOSPITAL_COMMUNITY): Payer: Self-pay | Admitting: Anesthesiology

## 2012-06-18 ENCOUNTER — Ambulatory Visit (HOSPITAL_COMMUNITY): Payer: Medicare Other | Admitting: Anesthesiology

## 2012-06-18 ENCOUNTER — Observation Stay (HOSPITAL_COMMUNITY)
Admission: RE | Admit: 2012-06-18 | Discharge: 2012-06-19 | Disposition: A | Discharge status: Home / Self Care | Payer: Medicare Other | Source: Ambulatory Visit | Attending: General Surgery | Admitting: General Surgery

## 2012-06-18 ENCOUNTER — Encounter (HOSPITAL_COMMUNITY): Payer: Self-pay | Admitting: *Deleted

## 2012-06-18 DIAGNOSIS — K219 Gastro-esophageal reflux disease without esophagitis: Secondary | ICD-10-CM | POA: Insufficient documentation

## 2012-06-18 DIAGNOSIS — Z01812 Encounter for preprocedural laboratory examination: Secondary | ICD-10-CM | POA: Insufficient documentation

## 2012-06-18 DIAGNOSIS — D0590 Unspecified type of carcinoma in situ of unspecified breast: Principal | ICD-10-CM | POA: Insufficient documentation

## 2012-06-18 DIAGNOSIS — E039 Hypothyroidism, unspecified: Secondary | ICD-10-CM | POA: Insufficient documentation

## 2012-06-18 DIAGNOSIS — Z79899 Other long term (current) drug therapy: Secondary | ICD-10-CM | POA: Insufficient documentation

## 2012-06-18 DIAGNOSIS — I10 Essential (primary) hypertension: Secondary | ICD-10-CM | POA: Insufficient documentation

## 2012-06-18 DIAGNOSIS — C50919 Malignant neoplasm of unspecified site of unspecified female breast: Secondary | ICD-10-CM

## 2012-06-18 SURGERY — EXCISION, LESION, BREAST
Anesthesia: General | Site: Breast | Laterality: Right | Wound class: Clean

## 2012-06-18 MED ORDER — LEVOTHYROXINE SODIUM 75 MCG PO TABS
75.0000 ug | ORAL_TABLET | Freq: Every day | ORAL | Status: DC
  Filled 2012-06-18 (×2): qty 1

## 2012-06-18 MED ORDER — ONDANSETRON HCL 4 MG/2ML IJ SOLN
INTRAMUSCULAR | Status: DC | PRN
  Administered 2012-06-18: 4 mg via INTRAVENOUS

## 2012-06-18 MED ORDER — LACTATED RINGERS IV SOLN
INTRAVENOUS | Status: DC
  Administered 2012-06-18: 1000 mL via INTRAVENOUS

## 2012-06-18 MED ORDER — PROMETHAZINE HCL 25 MG/ML IJ SOLN: 6.2500 mg | INTRAMUSCULAR | Status: DC | PRN

## 2012-06-18 MED ORDER — DEXAMETHASONE SODIUM PHOSPHATE 4 MG/ML IJ SOLN
INTRAMUSCULAR | Status: DC | PRN
  Administered 2012-06-18: 10 mg via INTRAVENOUS

## 2012-06-18 MED ORDER — ACETAMINOPHEN 650 MG RE SUPP: 650.0000 mg | Freq: Four times a day (QID) | RECTAL | Status: DC | PRN

## 2012-06-18 MED ORDER — BUPIVACAINE-EPINEPHRINE 0.25% -1:200000 IJ SOLN
INTRAMUSCULAR | Status: DC | PRN
  Administered 2012-06-18: 50 mL

## 2012-06-18 MED ORDER — LIDOCAINE HCL (CARDIAC) 20 MG/ML IV SOLN
INTRAVENOUS | Status: DC | PRN
  Administered 2012-06-18: 100 mg via INTRAVENOUS

## 2012-06-18 MED ORDER — 0.9 % SODIUM CHLORIDE (POUR BTL) OPTIME
TOPICAL | Status: DC | PRN
  Administered 2012-06-18: 1000 mL

## 2012-06-18 MED ORDER — HYDROCHLOROTHIAZIDE 25 MG PO TABS
25.0000 mg | ORAL_TABLET | Freq: Every day | ORAL | Status: DC
  Filled 2012-06-18 (×2): qty 1

## 2012-06-18 MED ORDER — HYDRALAZINE HCL 25 MG PO TABS
25.0000 mg | ORAL_TABLET | Freq: Two times a day (BID) | ORAL | Status: DC
  Administered 2012-06-18 – 2012-06-19 (×2): 25 mg via ORAL
  Filled 2012-06-18 (×3): qty 1

## 2012-06-18 MED ORDER — MORPHINE SULFATE 2 MG/ML IJ SOLN
2.0000 mg | INTRAMUSCULAR | Status: DC | PRN
Start: 2012-06-18 — End: 2012-06-19

## 2012-06-18 MED ORDER — FENTANYL CITRATE 0.05 MG/ML IJ SOLN
INTRAMUSCULAR | Status: DC | PRN
  Administered 2012-06-18 (×2): 50 ug via INTRAVENOUS

## 2012-06-18 MED ORDER — NEBIVOLOL HCL 5 MG PO TABS
5.0000 mg | ORAL_TABLET | Freq: Every evening | ORAL | Status: DC
  Administered 2012-06-18: 5 mg via ORAL
  Filled 2012-06-18 (×2): qty 1

## 2012-06-18 MED ORDER — PANTOPRAZOLE SODIUM 40 MG PO TBEC
40.0000 mg | DELAYED_RELEASE_TABLET | Freq: Every day | ORAL | Status: DC
  Administered 2012-06-19: 40 mg via ORAL
  Filled 2012-06-18 (×2): qty 1

## 2012-06-18 MED ORDER — OXYCODONE HCL 5 MG PO TABS: 5.0000 mg | ORAL_TABLET | ORAL | Status: DC | PRN

## 2012-06-18 MED ORDER — ACETAMINOPHEN 325 MG PO TABS: 650.0000 mg | ORAL_TABLET | Freq: Four times a day (QID) | ORAL | Status: DC | PRN

## 2012-06-18 MED ORDER — POTASSIUM CHLORIDE CRYS ER 20 MEQ PO TBCR
20.0000 meq | EXTENDED_RELEASE_TABLET | Freq: Every day | ORAL | Status: DC
  Administered 2012-06-18 – 2012-06-19 (×2): 20 meq via ORAL
  Filled 2012-06-18 (×2): qty 1

## 2012-06-18 MED ORDER — SODIUM CHLORIDE 0.9 % IV SOLN
INTRAVENOUS | Status: DC
  Administered 2012-06-18: 17:00:00 via INTRAVENOUS

## 2012-06-18 MED ORDER — ONDANSETRON HCL 4 MG/2ML IJ SOLN: 4.0000 mg | Freq: Four times a day (QID) | INTRAMUSCULAR | Status: DC | PRN

## 2012-06-18 MED ORDER — ALPRAZOLAM 0.25 MG PO TABS: 0.2500 mg | ORAL_TABLET | Freq: Three times a day (TID) | ORAL | Status: DC | PRN

## 2012-06-18 MED ORDER — PROPOFOL 10 MG/ML IV BOLUS
INTRAVENOUS | Status: DC | PRN
  Administered 2012-06-18: 150 mg via INTRAVENOUS

## 2012-06-18 MED ORDER — BUPIVACAINE-EPINEPHRINE PF 0.25-1:200000 % IJ SOLN
INTRAMUSCULAR | Status: AC
  Filled 2012-06-18: qty 30

## 2012-06-18 MED ORDER — INFLUENZA VIRUS VACC SPLIT PF IM SUSP
0.5000 mL | INTRAMUSCULAR | Status: AC
  Administered 2012-06-19: 0.5 mL via INTRAMUSCULAR
  Filled 2012-06-18: qty 0.5

## 2012-06-18 MED ORDER — FENTANYL CITRATE 0.05 MG/ML IJ SOLN
25.0000 ug | INTRAMUSCULAR | Status: DC | PRN
  Administered 2012-06-18 (×2): 50 ug via INTRAVENOUS

## 2012-06-18 MED ORDER — FENTANYL CITRATE 0.05 MG/ML IJ SOLN
INTRAMUSCULAR | Status: AC
  Filled 2012-06-18: qty 2

## 2012-06-18 SURGICAL SUPPLY — 47 items
APPLIER CLIP 11 MED OPEN (CLIP) ×2
BENZOIN TINCTURE PRP APPL 2/3 (GAUZE/BANDAGES/DRESSINGS) ×2 IMPLANT
BINDER BREAST LRG (GAUZE/BANDAGES/DRESSINGS) ×4 IMPLANT
BLADE HEX COATED 2.75 (ELECTRODE) ×2 IMPLANT
BLADE SURG 15 STRL LF DISP TIS (BLADE) ×1 IMPLANT
BLADE SURG 15 STRL SS (BLADE) ×1
CANISTER SUCTION 2500CC (MISCELLANEOUS) ×2 IMPLANT
CHLORAPREP W/TINT 26ML (MISCELLANEOUS) ×2 IMPLANT
CLIP APPLIE 11 MED OPEN (CLIP) ×1 IMPLANT
CLOTH BEACON ORANGE TIMEOUT ST (SAFETY) ×2 IMPLANT
CLSR STERI-STRIP ANTIMIC 1/2X4 (GAUZE/BANDAGES/DRESSINGS) ×2 IMPLANT
COVER SURGICAL LIGHT HANDLE (MISCELLANEOUS) ×2 IMPLANT
DECANTER SPIKE VIAL GLASS SM (MISCELLANEOUS) ×2 IMPLANT
DERMABOND ADVANCED (GAUZE/BANDAGES/DRESSINGS)
DERMABOND ADVANCED .7 DNX12 (GAUZE/BANDAGES/DRESSINGS) IMPLANT
DRAPE LAPAROSCOPIC ABDOMINAL (DRAPES) ×2 IMPLANT
DRSG TEGADERM 4X4.75 (GAUZE/BANDAGES/DRESSINGS) ×2 IMPLANT
ELECT REM PT RETURN 9FT ADLT (ELECTROSURGICAL) ×2
ELECTRODE REM PT RTRN 9FT ADLT (ELECTROSURGICAL) ×1 IMPLANT
GAUZE SPONGE 4X4 16PLY XRAY LF (GAUZE/BANDAGES/DRESSINGS) ×2 IMPLANT
GLOVE BIO SURGEON STRL SZ7 (GLOVE) ×2 IMPLANT
GLOVE BIOGEL PI IND STRL 7.0 (GLOVE) ×1 IMPLANT
GLOVE BIOGEL PI IND STRL 7.5 (GLOVE) IMPLANT
GLOVE BIOGEL PI INDICATOR 7.0 (GLOVE) ×1
GLOVE BIOGEL PI INDICATOR 7.5 (GLOVE)
GOWN PREVENTION PLUS LG XLONG (DISPOSABLE) IMPLANT
GOWN PREVENTION PLUS XLARGE (GOWN DISPOSABLE) IMPLANT
GOWN STRL NON-REIN LRG LVL3 (GOWN DISPOSABLE) ×4 IMPLANT
GOWN STRL REIN XL XLG (GOWN DISPOSABLE) IMPLANT
KIT BASIN OR (CUSTOM PROCEDURE TRAY) ×2 IMPLANT
KIT MARKER MARGIN INK (KITS) ×2 IMPLANT
NEEDLE HYPO 22GX1.5 SAFETY (NEEDLE) ×2 IMPLANT
NEEDLE HYPO 25X1 1.5 SAFETY (NEEDLE) IMPLANT
PACK BASIC VI WITH GOWN DISP (CUSTOM PROCEDURE TRAY) ×2 IMPLANT
PEN SKIN MARKING BROAD (MISCELLANEOUS) IMPLANT
PENCIL BUTTON HOLSTER BLD 10FT (ELECTRODE) ×2 IMPLANT
SPONGE GAUZE 4X4 12PLY (GAUZE/BANDAGES/DRESSINGS) ×2 IMPLANT
STRIP CLOSURE SKIN 1/2X4 (GAUZE/BANDAGES/DRESSINGS) ×2 IMPLANT
SUT MNCRL AB 4-0 PS2 18 (SUTURE) ×2 IMPLANT
SUT VIC AB 2-0 SH 27 (SUTURE) ×1
SUT VIC AB 2-0 SH 27X BRD (SUTURE) ×1 IMPLANT
SUT VIC AB 3-0 SH 27 (SUTURE) ×1
SUT VIC AB 3-0 SH 27XBRD (SUTURE) ×1 IMPLANT
SYR BULB IRRIGATION 50ML (SYRINGE) ×2 IMPLANT
SYR CONTROL 10ML LL (SYRINGE) ×2 IMPLANT
TOWEL OR 17X26 10 PK STRL BLUE (TOWEL DISPOSABLE) ×2 IMPLANT
YANKAUER SUCT BULB TIP 10FT TU (MISCELLANEOUS) ×2 IMPLANT

## 2012-06-18 NOTE — Anesthesia Preprocedure Evaluation (Addendum)
Anesthesia Evaluation  Patient identified by MRN, date of birth, ID band Patient awake    Reviewed: Allergy & Precautions, H&P , NPO status , Patient's Chart, lab work & pertinent test results  Airway Mallampati: II TM Distance: <3 FB Neck ROM: Full    Dental No notable dental hx.    Pulmonary neg pulmonary ROS,  breath sounds clear to auscultation  Pulmonary exam normal       Cardiovascular hypertension, Pt. on medications Rhythm:Regular Rate:Normal     Neuro/Psych negative neurological ROS  negative psych ROS   GI/Hepatic Neg liver ROS, GERD-  Medicated,  Endo/Other  Hypothyroidism   Renal/GU negative Renal ROS  negative genitourinary   Musculoskeletal negative musculoskeletal ROS (+)   Abdominal   Peds negative pediatric ROS (+)  Hematology negative hematology ROS (+)   Anesthesia Other Findings   Reproductive/Obstetrics negative OB ROS                          Anesthesia Physical Anesthesia Plan  ASA: II  Anesthesia Plan: General   Post-op Pain Management:    Induction: Intravenous  Airway Management Planned: LMA  Additional Equipment:   Intra-op Plan:   Post-operative Plan:   Informed Consent: I have reviewed the patients History and Physical, chart, labs and discussed the procedure including the risks, benefits and alternatives for the proposed anesthesia with the patient or authorized representative who has indicated his/her understanding and acceptance.   Dental advisory given  Plan Discussed with: CRNA and Surgeon  Anesthesia Plan Comments:         Anesthesia Quick Evaluation

## 2012-06-18 NOTE — Interval H&P Note (Signed)
History and Physical Interval Note:  06/18/2012 12:56 PM  Angelica Grimes  has presented today for surgery, with the diagnosis of Right breast cancer  The various methods of treatment have been discussed with the patient and family. After consideration of risks, benefits and other options for treatment, the patient has consented to  Procedure(s) (LRB) with comments: RE-EXCISION OF BREAST LUMPECTOMY (Right) as a surgical intervention .  The patient's history has been reviewed, patient examined, no change in status, stable for surgery.  I have reviewed the patient's chart and labs.  Questions were answered to the patient's satisfaction.     Angelica Grimes

## 2012-06-18 NOTE — Transfer of Care (Signed)
Immediate Anesthesia Transfer of Care Note  Patient: Angelica Grimes  Procedure(s) Performed: Procedure(s) (LRB) with comments: RE-EXCISION OF BREAST LUMPECTOMY (Right)  Patient Location: PACU  Anesthesia Type: General  Level of Consciousness: awake and alert   Airway & Oxygen Therapy: Patient Spontanous Breathing and Patient connected to face mask oxygen  Post-op Assessment: Report given to PACU RN and Post -op Vital signs reviewed and stable  Post vital signs: Reviewed and stable  Complications: No apparent anesthesia complications

## 2012-06-18 NOTE — Preoperative (Signed)
Beta Blockers   Reason not to administer Beta Blockers:Took Bystolic po last pm at 2200.

## 2012-06-18 NOTE — Anesthesia Postprocedure Evaluation (Signed)
  Anesthesia Post-op Note  Patient: Angelica Grimes  Procedure(s) Performed: Procedure(s) (LRB): RE-EXCISION OF BREAST LUMPECTOMY (Right)  Patient Location: PACU  Anesthesia Type: General  Level of Consciousness: awake and alert   Airway and Oxygen Therapy: Patient Spontanous Breathing  Post-op Pain: mild  Post-op Assessment: Post-op Vital signs reviewed, Patient's Cardiovascular Status Stable, Respiratory Function Stable, Patent Airway and No signs of Nausea or vomiting  Post-op Vital Signs: stable  Complications: No apparent anesthesia complications

## 2012-06-18 NOTE — H&P (View-Only) (Signed)
Subjective:     Patient ID: Angelica Grimes, female   DOB: Oct 09, 1934, 77 y.o.   MRN: [id number]  HPI 73 yof s/p right breast lumpectomy for dcis with 2 close margins.  She has done well and returns today prior to re-excision.  She and I have discussed pathology over the phone and are scheduled for re-excision lumpectomy tomorrow.  She reports no real complaints.  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 I think this should be reasonable given to close margins that are just focally closed. She understands that there may be additional positive margins requiring additional surgery.

## 2012-06-18 NOTE — Op Note (Signed)
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 Harden Mo Specimens: Superior and lateral margin marked with paint Anesthesia: Gen. With LMA Complications: None Drains: None Sponge and needle count was correct x2 at end of operation Disposition to recovery in stable condition  Indications: This is a 77 year old female who was diagnosed after an abnormal mammogram with a right breast ductal carcinoma in situ. She and I proceeded to do a lumpectomy. She has a 1.8 cm focus of high-grade ductal carcinoma in situ that approaches to within 1 mm of the superior and lateral margins. Per the NCCN guidelines this is not an adequate margin and we discussed reexcision of these margins. I discussed with her that there could be additional positive margins requiring more surgery but I thought we could get this clear with this procedure.  Procedure: After informed consent was obtained the patient was taken to the operating room. She had sequential compression devices placed. She was placed under general anesthesia with an LMA. Her right breast was prepped and draped in standard sterile surgical fashion. Surgical timeout was performed.  I reentered her old incision. I then cut all the stitches were approximated her breast tissue back together. I then identified the entire cavity. I identified my clips in the superior and lateral position. I then excised the margins in their entirety together. I then marked this with paint. I then placed clips in each of the positions again. I then obtained hemostasis. This was performed. I then closed the deep breast tissue with 2-0 Vicryl. I then closed the dermis with 3-0 Vicryl and the skin with 4-0 Monocryl. I infiltrated quarter percent Marcaine throughout the  wound. I then placed Steri-Strips and a sterile dressing. She tolerated this well was extubated and transferred to recovery stable.

## 2012-06-19 NOTE — Progress Notes (Signed)
Discharge instructions reviewed with patient and family, vital signs are stable, drsg and binder clean dry and intact,patient is to follow up with MD within 3 weeks, no prescriptions, questions and concerns answered Means, Myrtie Hawk RN 06-19-2012 9:38am

## 2012-06-19 NOTE — Discharge Summary (Signed)
Physician Discharge Summary  Patient ID: Angelica Grimes MRN: [id number] DOB/AGE: 1935/07/03 77 y.o.  Admit date: 06/18/2012 Discharge date: 06/19/2012  Admission Diagnoses: Breast cancer Hypertension  Discharge Diagnoses:  Same as above  Discharged Condition: good  Hospital Course: 46 yof who previously underwent lumpectomy with positive margin.  She underwent re-excision and remained overnight for monitoring.  She has done well and will be discharged home today.  Consults: None  Significant Diagnostic Studies: none  Treatments: surgery: re-excision lumpectomy  Discharge Exam: Blood pressure 146/76, pulse 65, temperature 98 F (36.7 C), temperature source Oral, resp. rate 18, height 5' 4.5" (1.638 m), weight 151 lb 9.6 oz (68.765 kg), SpO2 96.00%.   Disposition: 01-Home or Self Care     Medication List     As of 06/19/2012  7:48 AM    TAKE these medications         ALPRAZolam 0.25 MG tablet   Commonly known as: XANAX   Take 0.25 mg by mouth as needed. anxiety      atorvastatin 20 MG tablet   Commonly known as: LIPITOR   Take 20 mg by mouth every evening.      BYSTOLIC 5 MG tablet   Generic drug: nebivolol   Take 5 mg by mouth every evening.      CALCIUM-MAGNESIUM-ZINC-D3 PO   Take 1 tablet by mouth daily.      hydrALAZINE 25 MG tablet   Commonly known as: APRESOLINE   Take 25 mg by mouth 2 (two) times daily.      hydrochlorothiazide 25 MG tablet   Commonly known as: HYDRODIURIL   Take 25 mg by mouth daily with breakfast.      omeprazole 20 MG capsule   Commonly known as: PRILOSEC   Take 20 mg by mouth daily.      OSTEO BI-FLEX REGULAR STRENGTH PO   Take 2 tablets by mouth daily.      potassium chloride SA 20 MEQ tablet   Commonly known as: K-DUR,KLOR-CON   Take 20 mEq by mouth daily.      SYNTHROID 75 MCG tablet   Generic drug: levothyroxine   Take 75 mcg by mouth daily with breakfast.           Follow-up Information    Follow up with  [facility], [place]. In 3 weeks.   Contact information:   [address] [place] [address] [phone]          Signed: Emelia Loron 06/19/2012, 7:48 AM

## 2012-06-19 NOTE — Progress Notes (Addendum)
Flu vaccination teaching performed, information packet given. Flu vaccine given L Deltoid

## 2012-06-26 ENCOUNTER — Telehealth (INDEPENDENT_AMBULATORY_CARE_PROVIDER_SITE_OTHER): Payer: Self-pay

## 2012-06-26 NOTE — Telephone Encounter (Signed)
Pt has appt with Dr. Thersa Salt on 07/04/12 at 8:30 am, and has been notified.

## 2012-06-27 ENCOUNTER — Telehealth (INDEPENDENT_AMBULATORY_CARE_PROVIDER_SITE_OTHER): Payer: Self-pay

## 2012-06-27 DIAGNOSIS — C50919 Malignant neoplasm of unspecified site of unspecified female breast: Secondary | ICD-10-CM

## 2012-06-27 NOTE — Telephone Encounter (Signed)
LMOM I want to make sure the pt was given both her appt's that have been scheduled for Dr Derald Macleod on 07/02/12 @ 11:00 and the other appt is with Dr Thersa Salt on 07/04/12 @8 :30 at [facility].

## 2012-06-28 ENCOUNTER — Telehealth (INDEPENDENT_AMBULATORY_CARE_PROVIDER_SITE_OTHER): Payer: Self-pay

## 2012-06-28 NOTE — Telephone Encounter (Signed)
Returned pt's call. Pt is aware of both appt's with medical and radiation oncology at [facility].

## 2012-07-02 ENCOUNTER — Encounter: Payer: Medicare Other | Admitting: Internal Medicine

## 2012-07-02 DIAGNOSIS — D0590 Unspecified type of carcinoma in situ of unspecified breast: Secondary | ICD-10-CM

## 2012-07-12 ENCOUNTER — Ambulatory Visit (INDEPENDENT_AMBULATORY_CARE_PROVIDER_SITE_OTHER): Payer: Medicare Other | Admitting: General Surgery

## 2012-07-12 ENCOUNTER — Encounter (INDEPENDENT_AMBULATORY_CARE_PROVIDER_SITE_OTHER): Payer: Self-pay | Admitting: General Surgery

## 2012-07-12 VITALS — BP 126/76 | HR 68 | Temp 97.7°F | Resp 18 | Ht 64.0 in | Wt 157.6 lb

## 2012-07-12 DIAGNOSIS — Z09 Encounter for follow-up examination after completed treatment for conditions other than malignant neoplasm: Secondary | ICD-10-CM

## 2012-07-12 DIAGNOSIS — D0510 Intraductal carcinoma in situ of unspecified breast: Secondary | ICD-10-CM | POA: Insufficient documentation

## 2012-07-12 DIAGNOSIS — D0590 Unspecified type of carcinoma in situ of unspecified breast: Secondary | ICD-10-CM

## 2012-07-12 NOTE — Patient Instructions (Signed)

## 2012-07-12 NOTE — Progress Notes (Signed)
Subjective:     Patient ID: Angelica Grimes, female   DOB: 08-09-1935, 77 y.o.   MRN: [id number]  HPI This is a 77 year old female who was undergoing a screening mammogram within abnormality noted. This was biopsy showed ductal carcinoma in situ. She underwent a lumpectomy showing high-grade ductal carcinoma in situ that was 1.8 cm and close the 2 margins. I reexcised this and there was no additional DCIS. She is 100% estrogen and progesterone receptor positive. She's doing well postoperatively. She is seen both medical and radiation oncology in [place]. She is due to begin antiestrogen therapy as well as radiation therapy.  Review of Systems     Objective:   Physical Exam Healing right breast incision without infection    Assessment:     Ductal carcinoma in situ of right breast    Plan:     She did well postoperatively I released her to full activity. She [place] ways away. She will begin radiation and will be on antiestrogen therapy. I will plan on seeing her back in one year unless I need to sooner.

## 2012-09-05 DIAGNOSIS — D0590 Unspecified type of carcinoma in situ of unspecified breast: Secondary | ICD-10-CM

## 2012-09-17 ENCOUNTER — Encounter (INDEPENDENT_AMBULATORY_CARE_PROVIDER_SITE_OTHER): Payer: Self-pay

## 2012-10-09 ENCOUNTER — Encounter: Payer: Medicare Other | Admitting: Internal Medicine

## 2012-10-09 DIAGNOSIS — E876 Hypokalemia: Secondary | ICD-10-CM

## 2012-10-09 DIAGNOSIS — D0590 Unspecified type of carcinoma in situ of unspecified breast: Secondary | ICD-10-CM

## 2012-10-09 DIAGNOSIS — Z170 Estrogen receptor positive status [ER+]: Secondary | ICD-10-CM

## 2013-02-17 ENCOUNTER — Encounter: Payer: Medicare Other | Admitting: Internal Medicine

## 2013-02-17 DIAGNOSIS — Z170 Estrogen receptor positive status [ER+]: Secondary | ICD-10-CM

## 2013-02-17 DIAGNOSIS — D0590 Unspecified type of carcinoma in situ of unspecified breast: Secondary | ICD-10-CM

## 2013-05-07 ENCOUNTER — Other Ambulatory Visit: Payer: Self-pay

## 2013-06-11 ENCOUNTER — Encounter (INDEPENDENT_AMBULATORY_CARE_PROVIDER_SITE_OTHER): Payer: Self-pay | Admitting: General Surgery

## 2013-08-04 ENCOUNTER — Encounter (INDEPENDENT_AMBULATORY_CARE_PROVIDER_SITE_OTHER): Payer: Self-pay | Admitting: General Surgery

## 2013-08-04 ENCOUNTER — Ambulatory Visit (INDEPENDENT_AMBULATORY_CARE_PROVIDER_SITE_OTHER): Payer: Medicare Other | Admitting: General Surgery

## 2013-08-04 VITALS — BP 158/80 | HR 76 | Temp 98.9°F | Resp 15 | Ht 64.5 in | Wt 159.8 lb

## 2013-08-04 DIAGNOSIS — D0511 Intraductal carcinoma in situ of right breast: Secondary | ICD-10-CM

## 2013-08-04 DIAGNOSIS — D0590 Unspecified type of carcinoma in situ of unspecified breast: Secondary | ICD-10-CM

## 2013-08-04 NOTE — Patient Instructions (Signed)
Breast Self-Examination You should begin examining your breasts at age 78 even though the risk for breast cancer is low in this age group. It is important to become familiar with how your breasts look and feel. This is true for pregnant women, nursing mothers, women in menopause and women who have breast implants.  Women should examine their breasts once a month to look for changes and lumps. By doing monthly breast exams, you get to know how your breasts feel and how they can change from month to month. This allows you to pick up changes early. It can also offer you some reassurance that your breast health is good. This exam only takes minutes. Most breast lumps are not caused by cancer. If you find a lump, a special x-ray called a mammogram, or other tests may be needed to determine what is wrong.  Some of the signs that a breast lump is caused by cancer include:  Dimpling of the skin or changes in the shape of the breast or nipple.   A dark-colored or bloody discharge from the nipple.   Swollen lymph glands around the breast or in the armpit.   Redness of the breast or nipple.   Scaly nipple or skin on the breast.   Pain or swelling of the breast.  SELF-EXAM There are a few points to follow when doing a thorough breast exam. The best time to examine your breasts is 5 to 7 days after the menstrual period is over. During menstruation, the breasts are lumpier, and it may be more difficult to pick up changes. If you do not menstruate, have reached menopause or had a hysterectomy, examine your breasts the first day of every month. After three to four months, you will become more familiar with the variations of your breasts and more comfortable with the exam.  Perform your breast exam monthly. Keep a written record with breast changes or normal findings for each breast. This makes it easier to be sure of changes and to not solely depend on memory for size, tenderness, or location. Try to do the exam  at the same time each month, and write down where you are in your menstrual cycle if you are still menstruating.   Look at your breasts. Stand in front of a mirror with your hands clasped behind your head. Tighten your chest muscles and look for asymmetry. This means a difference in shape or contour from one breast to the other, such as puckers, dips or bumps. Look also for skin changes.   Lean forward with your hands on your hips. Again, look for symmetry and skin changes.   While showering, soap the breasts, and carefully feel the breasts with fingertips while holding the arm (on the side of the breast being examined) over the head. Do this with each breast carefully feeling for lumps or changes. Typically, a circular motion with moderate fingertip pressure should be used.   Repeat this exam while lying on your back, again with your arm behind your head and a pillow under your shoulders. Again, use your fingertips to examine both breasts, feeling for lumps and thickening. Begin at 1 o'clock and go clockwise around the whole breast.   At the end of your exam, gently squeeze each nipple to see if there is any drainage. Look for nipple changes, dimpling or redness.   Lastly, examine the upper chest and clavicle areas and in your armpits.  It is not necessary to become alarmed if you find   a breast lump. Most of them are not cancerous. However, it is necessary to see your caregiver if a lump is found in order to have it looked at. Document Released: 10/26/2004 Document Revised: 05/31/2011 Document Reviewed: 01/05/2009 [employer] Patient Information 2012 [employer]. 

## 2013-08-04 NOTE — Progress Notes (Signed)
Subjective:     Patient ID: Angelica Grimes, female   DOB: Sep 19, 1935, 78 y.o.   MRN: [id number]  HPI This is a 28 -year-old female who underwent a lumpectomy showing high-grade ductal carcinoma in situ that was 1.8 cm and was close at 2 margins.  I reexcised this and there was no additional DCIS. She is 100% estrogen and progesterone receptor positive. She's doing well postoperatively. She is seen both medical and radiation oncology in [place]. She underwent radiotherapy with Dr Thersa Salt in 12/13 and was seen by Dr Ubaldo Glassing (who is no longer there) and started on tamoxifen.  She is taking tamoxifen and tolerating it well without complaint.  She says she had mm that said follow up in one year and i will get a copy of that although I do not have today.  She reports no complaints referable to either breast. She is doing around self exams. She has no nipple discharge. She has a follow up with medical oncology but does not know this will be with.   Review of Systems     Objective:   Physical Exam  Vitals reviewed. Constitutional: She appears well-developed and well-nourished.  Neck: Neck supple.  Pulmonary/Chest: Right breast exhibits no inverted nipple, no mass, no nipple discharge, no skin change and no tenderness. Left breast exhibits no inverted nipple, no mass, no nipple discharge, no skin change and no tenderness.    Lymphadenopathy:    She has no cervical adenopathy.    She has no axillary adenopathy.       Right: No supraclavicular adenopathy present.       Left: No supraclavicular adenopathy present.       Assessment:     History of stage 0 right breast cancer     Plan:     She has no clinical evidence of recurrence. I'm going to get a copy of her most recent mammogram. She is going to continue her tamoxifen. She is due to see medical oncology in mid-November. It is a long drive for her to come down here. I offered to see her on an annual basis or if she just wants to follow up with  medical oncology closer to home that will be fine as well.

## 2013-08-07 ENCOUNTER — Other Ambulatory Visit: Payer: Self-pay

## 2013-08-13 ENCOUNTER — Encounter (INDEPENDENT_AMBULATORY_CARE_PROVIDER_SITE_OTHER): Payer: Self-pay

## 2013-10-10 ENCOUNTER — Encounter (HOSPITAL_COMMUNITY): Payer: Medicare Other | Attending: Hematology and Oncology

## 2013-10-10 VITALS — BP 155/64 | HR 86 | Temp 95.6°F | Resp 18 | Ht 65.0 in | Wt 161.0 lb

## 2013-10-10 DIAGNOSIS — D0511 Intraductal carcinoma in situ of right breast: Secondary | ICD-10-CM

## 2013-10-10 DIAGNOSIS — D0590 Unspecified type of carcinoma in situ of unspecified breast: Secondary | ICD-10-CM

## 2013-10-10 DIAGNOSIS — E7800 Pure hypercholesterolemia, unspecified: Secondary | ICD-10-CM | POA: Insufficient documentation

## 2013-10-10 DIAGNOSIS — E039 Hypothyroidism, unspecified: Secondary | ICD-10-CM

## 2013-10-10 DIAGNOSIS — I10 Essential (primary) hypertension: Secondary | ICD-10-CM | POA: Insufficient documentation

## 2013-10-10 DIAGNOSIS — Z09 Encounter for follow-up examination after completed treatment for conditions other than malignant neoplasm: Secondary | ICD-10-CM | POA: Insufficient documentation

## 2013-10-10 DIAGNOSIS — Z853 Personal history of malignant neoplasm of breast: Secondary | ICD-10-CM | POA: Insufficient documentation

## 2013-10-10 DIAGNOSIS — K219 Gastro-esophageal reflux disease without esophagitis: Secondary | ICD-10-CM | POA: Insufficient documentation

## 2013-10-10 LAB — COMPREHENSIVE METABOLIC PANEL
ALBUMIN: 3.6 g/dL (ref 3.5–5.2)
ALT: 19 U/L (ref 0–35)
AST: 19 U/L (ref 0–37)
Alkaline Phosphatase: 44 U/L (ref 39–117)
BILIRUBIN TOTAL: 0.2 mg/dL — AB (ref 0.3–1.2)
BUN: 15 mg/dL (ref 6–23)
CHLORIDE: 99 meq/L (ref 96–112)
CO2: 26 mEq/L (ref 19–32)
CREATININE: 0.81 mg/dL (ref 0.50–1.10)
Calcium: 8.8 mg/dL (ref 8.4–10.5)
GFR calc Af Amer: 80 mL/min — ABNORMAL LOW (ref >90)
GFR calc non Af Amer: 69 mL/min — ABNORMAL LOW (ref >90)
Glucose, Bld: 92 mg/dL (ref 70–99)
POTASSIUM: 3.3 meq/L — AB (ref 3.7–5.3)
Sodium: 139 mEq/L (ref 137–147)
TOTAL PROTEIN: 6.9 g/dL (ref 6.0–8.3)

## 2013-10-10 LAB — CBC WITH DIFFERENTIAL/PLATELET
BASOS ABS: 0 10*3/uL (ref 0.0–0.1)
BASOS PCT: 1 % (ref 0–1)
EOS ABS: 0.2 10*3/uL (ref 0.0–0.7)
Eosinophils Relative: 4 % (ref 0–5)
HCT: 37.6 % (ref 36.0–46.0)
Hemoglobin: 12.7 g/dL (ref 12.0–15.0)
Lymphocytes Relative: 27 % (ref 12–46)
Lymphs Abs: 1.5 10*3/uL (ref 0.7–4.0)
MCH: 29.3 pg (ref 26.0–34.0)
MCHC: 33.8 g/dL (ref 30.0–36.0)
MCV: 86.8 fL (ref 78.0–100.0)
MONO ABS: 0.5 10*3/uL (ref 0.1–1.0)
Monocytes Relative: 9 % (ref 3–12)
NEUTROS ABS: 3.4 10*3/uL (ref 1.7–7.7)
NEUTROS PCT: 59 % (ref 43–77)
Platelets: 203 10*3/uL (ref 150–400)
RBC: 4.33 MIL/uL (ref 3.87–5.11)
RDW: 13.3 % (ref 11.5–15.5)
WBC: 5.7 10*3/uL (ref 4.0–10.5)

## 2013-10-10 NOTE — Patient Instructions (Signed)
.  [facility] Discharge Instructions  RECOMMENDATIONS MADE BY THE CONSULTANT AND ANY TEST RESULTS WILL BE SENT TO YOUR REFERRING PHYSICIAN.  EXAM FINDINGS BY THE PHYSICIAN TODAY AND SIGNS OR SYMPTOMS TO REPORT TO CLINIC OR PRIMARY PHYSICIAN: Exam and findings as discussed by Dr. Barnet Glasgow.  MEDICATIONS PRESCRIBED: Continue tamoxifen Report any new lumps or bumps    INSTRUCTIONS/FOLLOW-UP: 6 months  Thank you for choosing [facility] to provide your oncology and hematology care.  To afford each patient quality time with our providers, please arrive at least 15 minutes before your scheduled appointment time.  With your help, our goal is to use those 15 minutes to complete the necessary work-up to ensure our physicians have the information they need to help with your evaluation and healthcare recommendations.    Effective January 1st, 2014, we ask that you re-schedule your appointment with our physicians should you arrive 10 or more minutes late for your appointment.  We strive to give you quality time with our providers, and arriving late affects you and other patients whose appointments are after yours.    Again, thank you for choosing [facility].  Our hope is that these requests will decrease the amount of time that you wait before being seen by our physicians.       _____________________________________________________________  Should you have questions after your visit to [facility], please contact our office at [phone] between the hours of 8:30 a.m. and 5:00 p.m.  Voicemails left after 4:30 p.m. will not be returned until the following business day.  For prescription refill requests, have your pharmacy contact our office with your prescription refill request.

## 2013-10-10 NOTE — Progress Notes (Signed)
Sussex A. Barnet Glasgow, M.D.  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     HISTORY OF PRESENT ILLNESS:Angelica Grimes is a 79 y.o. female who is seen for followup of noninvasive ductal neoplasia the right breast, status post right lumpectomy in 2013 followed by external beam radiotherapy ending in December of 2013 taking tamoxifen 20 mg daily.k   PAST MEDICAL HISTORY:  has a past medical history of Hypertension; Hyperlipidemia; Cancer; Cancer; GERD (gastroesophageal reflux disease); IBS (irritable bowel syndrome); Hypothyroidism; and Anxiety.   Noninvasive ductal neoplasia the right breast, status post right lumpectomy in 2013 followed by external beam radiotherapy ending in December of 2013 taking tamoxifen 20 mg daily. She denies any significant hot flashes, lower extremity redness or swelling, vaginal discharge or bleeding, PND, orthopnea, palpitations, anorexia, nausea, vomiting, diarrhea, constipation, dysuria, hematuria, incontinence, cough, wheezing, nasal drip, earache, skin rash, joint pain, headache, or seizures. She also denies any lymphedema of the right upper extremity.  PAST SURGICAL HISTORY: Past Surgical History  Procedure Laterality Date  . Bunionectomy  2012    right foot  . Cystectomy  1983  . Thyroidectomy  1991    due to cancer  . Tonsillectomy    . Colonoscopy    . Breast cyst surgery       x 2 - benign   . Breast surgery  1966, 1991, 1998    biopsy  . Breast lumpectomy      right breast with re-excision     CURRENT MEDICATIONS: has a current medication list which includes the following prescription(s): alprazolam, crestor, glucosamine-chondroitin,  hydralazine, hydrochlorothiazide, multiple minerals-vitamins, omeprazole, potassium chloride sa, synthroid, and tamoxifen. Synthroid 75 mcg daily   ALLERGIES: Aspirin; Benazepril; Penicillins; Clonidine derivatives; and Orange fruit   SOCIAL HISTORY:  reports that she has never smoked. She has never used smokeless tobacco. She reports that she drinks alcohol. She reports that she does not use illicit drugs.   FAMILY HISTORY: family history includes Cancer in her brother, father, sister, sister, and sister; Pneumonia in her mother; Stroke in her mother.    REVIEW OF SYSTEMS:  Other than that discussed above is noncontributory.    PHYSICAL EXAM:  vitals were not taken for this visit.   GENERAL:alert, no distress and comfortable SKIN: skin color, texture, turgor are normal, no rashes or significant lesions EYES: normal, Conjunctiva are pink and non-injected, sclera clear OROPHARYNX:no exudate, no erythema and lips, buccal mucosa, and tongue normal  NECK: supple, thyroid normal size, non-tender, without nodularity CHEST: Status post right breast lumpectomy with no masses in either breast. Lipoma is noted in the right axilla. LYMPH:  no palpable lymphadenopathy in the cervical, axillary or inguinal LUNGS: clear to auscultation and percussion with normal breathing effort HEART: regular rate & rhythm and no murmurs ABDOMEN:abdomen soft, non-tender and normal bowel sounds MUSCULOSKELETALl:no cyanosis of digits, no clubbing or edema  NEURO: alert & oriented x 3 with fluent speech, no focal motor/sensory deficits    LABORATORY DATA:  No visits with results within 30 Day(s) from this visit. Latest known visit with results is:  Hospital Outpatient Visit on 06/17/2012  Component Date Value Range Status  . WBC 06/17/2012 6.0  4.0 -  10.5 K/uL Final  . RBC 06/17/2012 4.20  3.87 - 5.11 MIL/uL Final  . Hemoglobin 06/17/2012 11.9* 12.0 - 15.0 g/dL Final  . HCT 06/17/2012 35.9* 36.0 - 46.0 %  Final  . MCV 06/17/2012 85.5  78.0 - 100.0 fL Final  . MCH 06/17/2012 28.3  26.0 - 34.0 pg Final  . MCHC 06/17/2012 33.1  30.0 - 36.0 g/dL Final  . RDW 06/17/2012 13.6  11.5 - 15.5 % Final  . Platelets 06/17/2012 238  150 - 400 K/uL Final  . Sodium 06/17/2012 139  135 - 145 mEq/L Final  . Potassium 06/17/2012 3.5  3.5 - 5.1 mEq/L Final  . Chloride 06/17/2012 100  96 - 112 mEq/L Final  . CO2 06/17/2012 30  19 - 32 mEq/L Final  . Glucose, Bld 06/17/2012 91  70 - 99 mg/dL Final  . BUN 06/17/2012 14  6 - 23 mg/dL Final  . Creatinine, Ser 06/17/2012 0.90  0.50 - 1.10 mg/dL Final  . Calcium 06/17/2012 9.4  8.4 - 10.5 mg/dL Final  . GFR calc non Af Amer 06/17/2012 61* >90 mL/min Final  . GFR calc Af Amer 06/17/2012 71* >90 mL/min Final   Comment:                                 The eGFR has been calculated                          using the CKD EPI equation.                          This calculation has not been                          validated in all clinical                          situations.                          eGFR's persistently                          <90 mL/min signify                          possible Chronic Kidney Disease.  Marland Kitchen MRSA, PCR 06/17/2012 NEGATIVE  NEGATIVE Final  . Staphylococcus aureus 06/17/2012 NEGATIVE  NEGATIVE Final   Comment:                                 The Xpert SA Assay (FDA                          approved for NASAL specimens                          in patients over 39 years of age),                          is one component of  a comprehensive surveillance                          program.  Test performance has                          been validated by [facility] for patients greater                          than or equal to 96 year old.                          It is not intended                          to diagnose infection nor to                          guide or monitor treatment.     Urinalysis No results found for this basename: colorurine, appearanceur, labspec, phurine, glucoseu, hgbur, bilirubinur, ketonesur, proteinur, urobilinogen, nitrite, leukocytesur      @RADIOGRAPHY :   PATHOLOGY: Noninvasive ductal neoplasia, high-grade, ER/PR positive status post excision 05/21/2012 with positive or close margins, status post additional surgery on 06/18/2012 with no evidence of disease   IMPRESSION:  #1. Noninvasive ductal neoplasia the right breast, status post lumpectomy, radiotherapy, currently taking tamoxifen and tolerating it well. #2. Hypothyroidism, on treatment. #3. Right axillary lipoma. #4. Hypertension, controlled. #5. Gastroesophageal reflux disease, controlled. #6. Hypercholesterolemia, on treatment.   PLAN:  #1. Continue tamoxifen 20 mg daily. Patient was told to contact her gynecologist should she develop any vaginal spotting and to call here should she develop lower 70 swelling or redness, chest pain, or shortness of breath. Plan is to continue on tamoxifen for 5 years total #2. Followup mammogram will be done at [facility] in June of 2015. #3. Followup at [facility] in 6 months.   Doroteo Bradford, MD 10/10/2013 2:10 PM

## 2013-10-10 NOTE — Progress Notes (Signed)
Angelica Grimes presented for labwork. Labs per MD order drawn via Peripheral Line 25 gauge needle inserted in lt ac.  Good blood return present. Procedure without incident.  Needle removed intact. Patient tolerated procedure well.

## 2013-10-11 LAB — TSH: TSH: 8.552 u[IU]/mL — AB (ref 0.350–4.500)

## 2013-10-13 ENCOUNTER — Telehealth (HOSPITAL_COMMUNITY): Payer: Self-pay | Admitting: Hematology and Oncology

## 2013-10-13 MED ORDER — TAMOXIFEN CITRATE 20 MG PO TABS: 20.0000 mg | ORAL_TABLET | Freq: Every day | ORAL | Status: DC

## 2013-10-13 NOTE — Telephone Encounter (Signed)
See telephone note.

## 2013-10-31 ENCOUNTER — Telehealth (HOSPITAL_COMMUNITY): Payer: Self-pay | Admitting: Hematology and Oncology

## 2013-10-31 NOTE — Telephone Encounter (Signed)
See telephone note.

## 2014-02-06 ENCOUNTER — Encounter (HOSPITAL_COMMUNITY): Payer: Self-pay

## 2014-02-06 ENCOUNTER — Encounter (HOSPITAL_COMMUNITY): Payer: Medicare Other | Attending: Hematology and Oncology

## 2014-02-06 VITALS — BP 158/70 | HR 77 | Temp 97.7°F | Resp 18 | Wt 158.2 lb

## 2014-02-06 DIAGNOSIS — D0510 Intraductal carcinoma in situ of unspecified breast: Secondary | ICD-10-CM

## 2014-02-06 DIAGNOSIS — D0590 Unspecified type of carcinoma in situ of unspecified breast: Secondary | ICD-10-CM

## 2014-02-06 DIAGNOSIS — E039 Hypothyroidism, unspecified: Secondary | ICD-10-CM

## 2014-02-06 NOTE — Patient Instructions (Signed)
[facility] Discharge Instructions  RECOMMENDATIONS MADE BY THE CONSULTANT AND ANY TEST RESULTS WILL BE SENT TO YOUR REFERRING PHYSICIAN.  EXAM FINDINGS BY THE PHYSICIAN TODAY AND SIGNS OR SYMPTOMS TO REPORT TO CLINIC OR PRIMARY PHYSICIAN: Exam and findings as discussed by Dr.Formanek. Report any new lumps, bone pain, shortness of breath or other symptoms. Mammogram is due in June.  Please have them fax a copy of the results to [phone].  MEDICATIONS PRESCRIBED:  Continue tamoxifen  INSTRUCTIONS/FOLLOW-UP: Follow-up in 6 months with labs and office visit.  Thank you for choosing [facility] to provide your oncology and hematology care.  To afford each patient quality time with our providers, please arrive at least 15 minutes before your scheduled appointment time.  With your help, our goal is to use those 15 minutes to complete the necessary work-up to ensure our physicians have the information they need to help with your evaluation and healthcare recommendations.    Effective January 1st, 2014, we ask that you re-schedule your appointment with our physicians should you arrive 10 or more minutes late for your appointment.  We strive to give you quality time with our providers, and arriving late affects you and other patients whose appointments are after yours.    Again, thank you for choosing [facility].  Our hope is that these requests will decrease the amount of time that you wait before being seen by our physicians.       _____________________________________________________________  Should you have questions after your visit to [facility], please contact our office at [phone] between the hours of 8:30 a.m. and 5:00 p.m.  Voicemails left after 4:30 p.m. will not be returned until the following business day.  For prescription refill requests, have your pharmacy contact our office with your prescription refill request.

## 2014-02-06 NOTE — Progress Notes (Signed)
[facility]  OFFICE PROGRESS NOTE  Octavio Graves, DO [place] [address]  DIAGNOSIS: Hypothyroidism - Plan: TSH  DCIS (ductal carcinoma in situ) of breast - Plan: CBC with Differential, Comprehensive metabolic panel  Chief Complaint  Patient presents with  . Noninvasive ductal neoplasia of the breast  . Hypothyroidism    CURRENT THERAPY: Tamoxifen 20 mg daily.  INTERVAL HISTORY: Angelica Grimes 79 y.o. 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 She's been taking tamoxifen 20 mg daily since.  She is concerned about restarting by systolic tablets since she stopped them herself 6 months ago after reading some side effects. She did not feel any difference since. She did have her Synthroid dose adjusted based upon higher TSH documented 3 months ago. She has had increasing energy over the past month. She denies any lower extremity edema or swelling, peripheral paresthesias, cough, wheezing, headache, or seizures. Bowel movements are regular with no melena, hematochezia, hematuria, skin rash, worsening joint pain. She denies any significant hot flashes, vaginal discharge, lower extremity swelling or redness, PND, orthopnea, or palpitations.   MEDICAL HISTORY: Past Medical History  Diagnosis Date  . Hypertension   . Hyperlipidemia   . Cancer     breast  . Cancer     thyroid  . GERD (gastroesophageal reflux disease)   . IBS (irritable bowel syndrome)   . Hypothyroidism   . Anxiety     INTERIM HISTORY: has DCIS (ductal carcinoma in situ) of breast on her problem list.   Right breast lumpectomy in 2013 for noninvasive ductal neoplasia followed by adjuvant radiotherapy completed in December of 2013 currently taking tamoxifen 20 mg daily. ALLERGIES:  is allergic to shrimp;  aspirin; benazepril; penicillins; clonidine derivatives; and orange fruit.  MEDICATIONS: has a current medication list which includes the following prescription(s): alprazolam, crestor, glucosamine-chondroitin, hydralazine, hydrochlorothiazide, multiple minerals-vitamins, omeprazole, potassium chloride sa, synthroid, tamoxifen, and aspirin ec.  SURGICAL HISTORY:  Past Surgical History  Procedure Laterality Date  . Bunionectomy  2012    right foot  . Cystectomy  1983  . Thyroidectomy  1991    due to cancer  . Tonsillectomy    . Colonoscopy    . Breast cyst surgery       x 2 - benign   . Breast surgery  1966, 1991, 1998    biopsy  . Breast lumpectomy      right breast with re-excision    FAMILY HISTORY: family history includes Cancer in her brother, father, sister, sister, and sister; Pneumonia in her mother; Stroke in her mother.  SOCIAL HISTORY:  reports that she has never smoked. She has never used smokeless tobacco. She reports that she drinks alcohol. She reports that she does not use illicit drugs.  REVIEW OF SYSTEMS:  Other than that discussed above is noncontributory.  PHYSICAL EXAMINATION: ECOG PERFORMANCE STATUS: 1 - Symptomatic but completely ambulatory  Blood pressure 158/70, pulse 77, temperature 97.7 F (36.5 C), temperature source Oral, resp. rate 18, weight 158 lb 3.2 oz (71.759 kg).  GENERAL:alert, no distress and comfortable SKIN: skin color, texture, turgor are normal, no rashes or significant lesions EYES: PERLA; Conjunctiva are pink and non-injected, sclera clear SINUSES: No redness or tenderness over maxillary or ethmoid sinuses OROPHARYNX:no exudate, no erythema on lips, buccal mucosa, or tongue.  NECK: supple, thyroid normal size, non-tender, without nodularity. No masses CHEST: Status post right lumpectomy with no masses in either breast.  LYMPH:  no palpable lymphadenopathy in the cervical, axillary or inguinal LUNGS: clear to auscultation and  percussion with normal breathing effort HEART: regular rate & rhythm and no murmurs. ABDOMEN:abdomen soft, non-tender and normal bowel sounds MUSCULOSKELETAL:no cyanosis of digits and no clubbing. Range of motion normal.  NEURO: alert & oriented x 3 with fluent speech, no focal motor/sensory deficits. DTRs normal.   LABORATORY DATA: No visits with results within 30 Day(s) from this visit. Latest known visit with results is:  Office Visit on 10/10/2013  Component Date Value Ref Range Status  . WBC 10/10/2013 5.7  4.0 - 10.5 K/uL Final  . RBC 10/10/2013 4.33  3.87 - 5.11 MIL/uL Final  . Hemoglobin 10/10/2013 12.7  12.0 - 15.0 g/dL Final  . HCT 10/10/2013 37.6  36.0 - 46.0 % Final  . MCV 10/10/2013 86.8  78.0 - 100.0 fL Final  . MCH 10/10/2013 29.3  26.0 - 34.0 pg Final  . MCHC 10/10/2013 33.8  30.0 - 36.0 g/dL Final  . RDW 10/10/2013 13.3  11.5 - 15.5 % Final  . Platelets 10/10/2013 203  150 - 400 K/uL Final  . Neutrophils Relative % 10/10/2013 59  43 - 77 % Final  . Neutro Abs 10/10/2013 3.4  1.7 - 7.7 K/uL Final  . Lymphocytes Relative 10/10/2013 27  12 - 46 % Final  . Lymphs Abs 10/10/2013 1.5  0.7 - 4.0 K/uL Final  . Monocytes Relative 10/10/2013 9  3 - 12 % Final  . Monocytes Absolute 10/10/2013 0.5  0.1 - 1.0 K/uL Final  . Eosinophils Relative 10/10/2013 4  0 - 5 % Final  . Eosinophils Absolute 10/10/2013 0.2  0.0 - 0.7 K/uL Final  . Basophils Relative 10/10/2013 1  0 - 1 % Final  . Basophils Absolute 10/10/2013 0.0  0.0 - 0.1 K/uL Final  . Sodium 10/10/2013 139  137 - 147 mEq/L Final  . Potassium 10/10/2013 3.3* 3.7 - 5.3 mEq/L Final  . Chloride 10/10/2013 99  96 - 112 mEq/L Final  . CO2 10/10/2013 26  19 - 32 mEq/L Final  . Glucose, Bld 10/10/2013 92  70 - 99 mg/dL Final  . BUN 10/10/2013 15  6 - 23 mg/dL Final  . Creatinine, Ser 10/10/2013 0.81  0.50 - 1.10 mg/dL Final  . Calcium 10/10/2013 8.8  8.4 - 10.5 mg/dL Final  . Total Protein 10/10/2013 6.9  6.0 - 8.3 g/dL  Final  . Albumin 10/10/2013 3.6  3.5 - 5.2 g/dL Final  . AST 10/10/2013 19  0 - 37 U/L Final  . ALT 10/10/2013 19  0 - 35 U/L Final  . Alkaline Phosphatase 10/10/2013 44  39 - 117 U/L Final  . Total Bilirubin 10/10/2013 0.2* 0.3 - 1.2 mg/dL Final  . GFR calc non Af Amer 10/10/2013 69* >90 mL/min Final  . GFR calc Af Amer 10/10/2013 80* >90 mL/min Final   Comment: (NOTE)                          The eGFR has been calculated using the CKD EPI equation.                          This calculation has not been validated in all clinical situations.                            eGFR's persistently <90 mL/min signify possible Chronic Kidney                          Disease.  . TSH 10/10/2013 8.552* 0.350 - 4.500 uIU/mL Final   Performed at [facility]    PATHOLOGY:No new pathology.  Urinalysis No results found for this basename: colorurine,  appearanceur,  labspec,  phurine,  glucoseu,  hgbur,  bilirubinur,  ketonesur,  proteinur,  urobilinogen,  nitrite,  leukocytesur    RADIOGRAPHIC STUDIES: No results found.  ASSESSMENT:  #1. Noninvasive ductal neoplasia the right breast, no evidence of disease. #2. Hypothyroidism, normalization of TSH after changing dose of Synthroid. #3. Hypertension, controlled.   PLAN:  #1. Continue tamoxifen 20 mg daily. #2. Bilateral mammography in June at [facility]. #3. Followup in 6 months with CBC and chem profile along with TSH.   All questions were answered. The patient knows to call the clinic with any problems, questions or concerns. We can certainly see the patient much sooner if necessary.   I spent 25 minutes counseling the patient face to face. The total time spent in the appointment was 30 minutes.    Gregory Formanek, MD 02/06/2014 3:13 PM  DISCLAIMER:  This note was dictated with voice recognition software.  Similar sounding words can inadvertently be transcribed inaccurately and may not be corrected upon review.    

## 2014-04-16 ENCOUNTER — Encounter: Payer: Self-pay | Admitting: Hematology and Oncology

## 2014-08-07 ENCOUNTER — Encounter (HOSPITAL_COMMUNITY): Payer: Self-pay

## 2014-08-07 ENCOUNTER — Encounter (HOSPITAL_BASED_OUTPATIENT_CLINIC_OR_DEPARTMENT_OTHER): Payer: Medicare Other

## 2014-08-07 ENCOUNTER — Encounter (HOSPITAL_COMMUNITY): Payer: Medicare Other | Attending: Hematology and Oncology

## 2014-08-07 VITALS — BP 153/70 | HR 77 | Temp 98.0°F | Resp 18 | Wt 153.0 lb

## 2014-08-07 DIAGNOSIS — R252 Cramp and spasm: Secondary | ICD-10-CM | POA: Diagnosis not present

## 2014-08-07 DIAGNOSIS — D0510 Intraductal carcinoma in situ of unspecified breast: Secondary | ICD-10-CM | POA: Insufficient documentation

## 2014-08-07 DIAGNOSIS — E038 Other specified hypothyroidism: Secondary | ICD-10-CM

## 2014-08-07 DIAGNOSIS — E7800 Pure hypercholesterolemia, unspecified: Secondary | ICD-10-CM

## 2014-08-07 DIAGNOSIS — I10 Essential (primary) hypertension: Secondary | ICD-10-CM | POA: Insufficient documentation

## 2014-08-07 DIAGNOSIS — E039 Hypothyroidism, unspecified: Secondary | ICD-10-CM

## 2014-08-07 DIAGNOSIS — D0511 Intraductal carcinoma in situ of right breast: Secondary | ICD-10-CM

## 2014-08-07 LAB — LIPID PANEL
Cholesterol: 146 mg/dL (ref 0–200)
HDL: 58 mg/dL (ref >39)
LDL CALC: 59 mg/dL (ref 0–99)
TRIGLYCERIDES: 144 mg/dL (ref <150)
Total CHOL/HDL Ratio: 2.5 RATIO
VLDL: 29 mg/dL (ref 0–40)

## 2014-08-07 LAB — COMPREHENSIVE METABOLIC PANEL
ALT: 21 U/L (ref 0–35)
AST: 22 U/L (ref 0–37)
Albumin: 3.8 g/dL (ref 3.5–5.2)
Alkaline Phosphatase: 47 U/L (ref 39–117)
Anion gap: 13 (ref 5–15)
BUN: 11 mg/dL (ref 6–23)
CALCIUM: 8.7 mg/dL (ref 8.4–10.5)
CO2: 27 mEq/L (ref 19–32)
Chloride: 101 mEq/L (ref 96–112)
Creatinine, Ser: 0.83 mg/dL (ref 0.50–1.10)
GFR calc non Af Amer: 66 mL/min — ABNORMAL LOW (ref >90)
GFR, EST AFRICAN AMERICAN: 77 mL/min — AB (ref >90)
Glucose, Bld: 109 mg/dL — ABNORMAL HIGH (ref 70–99)
Potassium: 3.9 mEq/L (ref 3.7–5.3)
Sodium: 141 mEq/L (ref 137–147)
TOTAL PROTEIN: 6.9 g/dL (ref 6.0–8.3)
Total Bilirubin: 0.3 mg/dL (ref 0.3–1.2)

## 2014-08-07 LAB — CBC WITH DIFFERENTIAL/PLATELET
BASOS PCT: 0 % (ref 0–1)
Basophils Absolute: 0 10*3/uL (ref 0.0–0.1)
EOS ABS: 0.1 10*3/uL (ref 0.0–0.7)
EOS PCT: 2 % (ref 0–5)
HCT: 35.6 % — ABNORMAL LOW (ref 36.0–46.0)
Hemoglobin: 12 g/dL (ref 12.0–15.0)
LYMPHS ABS: 1.7 10*3/uL (ref 0.7–4.0)
Lymphocytes Relative: 29 % (ref 12–46)
MCH: 29.1 pg (ref 26.0–34.0)
MCHC: 33.7 g/dL (ref 30.0–36.0)
MCV: 86.2 fL (ref 78.0–100.0)
MONOS PCT: 7 % (ref 3–12)
Monocytes Absolute: 0.4 10*3/uL (ref 0.1–1.0)
NEUTROS PCT: 62 % (ref 43–77)
Neutro Abs: 3.6 10*3/uL (ref 1.7–7.7)
Platelets: 171 10*3/uL (ref 150–400)
RBC: 4.13 MIL/uL (ref 3.87–5.11)
RDW: 13.5 % (ref 11.5–15.5)
WBC: 5.8 10*3/uL (ref 4.0–10.5)

## 2014-08-07 LAB — TSH: TSH: 2.31 u[IU]/mL (ref 0.350–4.500)

## 2014-08-07 NOTE — Patient Instructions (Signed)
[facility] Discharge Instructions  RECOMMENDATIONS MADE BY THE CONSULTANT AND ANY TEST RESULTS WILL BE SENT TO YOUR REFERRING PHYSICIAN.  Return in 6 months for lab work and office visit.  Thank you for choosing [facility] to provide your oncology and hematology care.  To afford each patient quality time with our providers, please arrive at least 15 minutes before your scheduled appointment time.  With your help, our goal is to use those 15 minutes to complete the necessary work-up to ensure our physicians have the information they need to help with your evaluation and healthcare recommendations.    Effective January 1st, 2014, we ask that you re-schedule your appointment with our physicians should you arrive 10 or more minutes late for your appointment.  We strive to give you quality time with our providers, and arriving late affects you and other patients whose appointments are after yours.    Again, thank you for choosing [facility].  Our hope is that these requests will decrease the amount of time that you wait before being seen by our physicians.       _____________________________________________________________  Should you have questions after your visit to [facility], please contact our office at [phone] between the hours of 8:30 a.m. and 4:30 p.m.  Voicemails left after 4:30 p.m. will not be returned until the following business day.  For prescription refill requests, have your pharmacy contact our office with your prescription refill request.    _______________________________________________________________  We hope that we have given you very good care.  You may receive a patient satisfaction survey in the mail, please complete it and return it as soon as possible.  We value your feedback!  _______________________________________________________________  Have you asked about our STAR program?  STAR stands for  Survivorship Training and Rehabilitation, and this is a nationally recognized cancer care program that focuses on survivorship and rehabilitation.  Cancer and cancer treatments may cause problems, such as, pain, making you feel tired and keeping you from doing the things that you need or want to do. Cancer rehabilitation can help. Our goal is to reduce these troubling effects and help you have the best quality of life possible.  You may receive a survey from a nurse that asks questions about your current state of health.  Based on the survey results, all eligible patients will be referred to the [facility] program for an evaluation so we can better serve you!  A frequently asked questions sheet is available upon request.

## 2014-08-07 NOTE — Progress Notes (Signed)
[place]  OFFICE PROGRESS NOTE  Octavio Graves, [place] [address] [place] [address]  DIAGNOSIS: DCIS (ductal carcinoma in situ) of breast, right  Other specified hypothyroidism  Hypercholesterolemia - Plan: Lipid panel, Lipid panel  Chief Complaint  Patient presents with  . DCIS    CURRENT THERAPY: tamoxifen 20 mg daily.  INTERVAL HISTORY: Angelica Grimes 79 y.o. 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 She's been taking tamoxifen 20 mg daily since.   Primary complaint is cramping of her hands and sometimes the feet as well. It takes about 5 or 6 minutes for massage to reverse. She denies a lower extremity swelling or redness, vaginal discharge or itching, dysuria, incontinence, skin rash, hot flashes, cough, wheezing, diarrhea, constipation, Elinor, hematochezia, hematuria, skin rash, headache, or seizures.  MEDICAL HISTORY: Past Medical History  Diagnosis Date  . Hypertension   . Hyperlipidemia   . Cancer     breast  . Cancer     thyroid  . GERD (gastroesophageal reflux disease)   . IBS (irritable bowel syndrome)   . Hypothyroidism   . Anxiety     INTERIM HISTORY: has DCIS (ductal carcinoma in situ) of breast on her problem list.    ALLERGIES:  is allergic to shrimp; aspirin; benazepril; penicillins; clonidine derivatives; and orange fruit.  MEDICATIONS: has a current medication list which includes the following prescription(s): alprazolam, crestor, glucosamine-chondroitin, hydralazine, hydrochlorothiazide, multiple minerals-vitamins, omeprazole, potassium chloride sa, synthroid, tamoxifen, and aspirin ec.  SURGICAL HISTORY:  Past Surgical History  Procedure Laterality Date  . Bunionectomy  2012    right foot  . Cystectomy  1983  . Thyroidectomy  1991    due to  cancer  . Tonsillectomy    . Colonoscopy    . Breast cyst surgery       x 2 - benign   . Breast surgery  1966, 1991, 1998    biopsy  . Breast lumpectomy      right breast with re-excision    FAMILY HISTORY: family history includes Cancer in her brother, father, sister, sister, and sister; Pneumonia in her mother; Stroke in her mother.  SOCIAL HISTORY:  reports that she has never smoked. She has never used smokeless tobacco. She reports that she drinks alcohol. She reports that she does not use illicit drugs.  REVIEW OF SYSTEMS:  Other than that discussed above is noncontributory.  PHYSICAL EXAMINATION: ECOG PERFORMANCE STATUS: 1 - Symptomatic but completely ambulatory  Blood pressure 153/70, pulse 77, temperature 98 F (36.7 C), temperature source Oral, resp. rate 18, weight 153 lb (69.4 kg).  GENERAL:alert, no distress and comfortable SKIN: skin color, texture, turgor are normal, no rashes or significant lesions EYES: PERLA; Conjunctiva are pink and non-injected, sclera clear SINUSES: No redness or tenderness over maxillary or ethmoid sinuses OROPHARYNX:no exudate, no erythema on lips, buccal mucosa, or tongue. NECK: supple, thyroid normal size, non-tender, without nodularity. No masses CHEST: status post right lumpectomy with no masses in either breast. LYMPH:  no palpable lymphadenopathy in the cervical, axillary or inguinal LUNGS: clear to auscultation and percussion with normal breathing effort HEART: regular rate & rhythm and no murmurs. ABDOMEN:abdomen soft, non-tender and normal bowel sounds MUSCULOSKELETAL:no cyanosis of digits and no clubbing. Range of motion normal.  NEURO: alert & oriented x 3 with  fluent speech, no focal motor/sensory deficits   LABORATORY DATA: Lab on 08/07/2014  Component Date Value Ref Range Status  . WBC 08/07/2014 5.8  4.0 - 10.5 K/uL Final  . RBC 08/07/2014 4.13  3.87 - 5.11 MIL/uL Final  . Hemoglobin 08/07/2014 12.0  12.0 - 15.0 g/dL  Final  . HCT 08/07/2014 35.6* 36.0 - 46.0 % Final  . MCV 08/07/2014 86.2  78.0 - 100.0 fL Final  . MCH 08/07/2014 29.1  26.0 - 34.0 pg Final  . MCHC 08/07/2014 33.7  30.0 - 36.0 g/dL Final  . RDW 08/07/2014 13.5  11.5 - 15.5 % Final  . Platelets 08/07/2014 171  150 - 400 K/uL Final  . Neutrophils Relative % 08/07/2014 62  43 - 77 % Final  . Neutro Abs 08/07/2014 3.6  1.7 - 7.7 K/uL Final  . Lymphocytes Relative 08/07/2014 29  12 - 46 % Final  . Lymphs Abs 08/07/2014 1.7  0.7 - 4.0 K/uL Final  . Monocytes Relative 08/07/2014 7  3 - 12 % Final  . Monocytes Absolute 08/07/2014 0.4  0.1 - 1.0 K/uL Final  . Eosinophils Relative 08/07/2014 2  0 - 5 % Final  . Eosinophils Absolute 08/07/2014 0.1  0.0 - 0.7 K/uL Final  . Basophils Relative 08/07/2014 0  0 - 1 % Final  . Basophils Absolute 08/07/2014 0.0  0.0 - 0.1 K/uL Final  . Sodium 08/07/2014 141  137 - 147 mEq/L Final  . Potassium 08/07/2014 3.9  3.7 - 5.3 mEq/L Final  . Chloride 08/07/2014 101  96 - 112 mEq/L Final  . CO2 08/07/2014 27  19 - 32 mEq/L Final  . Glucose, Bld 08/07/2014 109* 70 - 99 mg/dL Final  . BUN 08/07/2014 11  6 - 23 mg/dL Final  . Creatinine, Ser 08/07/2014 0.83  0.50 - 1.10 mg/dL Final  . Calcium 08/07/2014 8.7  8.4 - 10.5 mg/dL Final  . Total Protein 08/07/2014 6.9  6.0 - 8.3 g/dL Final  . Albumin 08/07/2014 3.8  3.5 - 5.2 g/dL Final  . AST 08/07/2014 22  0 - 37 U/L Final  . ALT 08/07/2014 21  0 - 35 U/L Final  . Alkaline Phosphatase 08/07/2014 47  39 - 117 U/L Final  . Total Bilirubin 08/07/2014 0.3  0.3 - 1.2 mg/dL Final  . GFR calc non Af Amer 08/07/2014 66* >90 mL/min Final  . GFR calc Af Amer 08/07/2014 77* >90 mL/min Final   Comment: (NOTE) The eGFR has been calculated using the CKD EPI equation. This calculation has not been validated in all clinical situations. eGFR's persistently <90 mL/min signify possible Chronic Kidney Disease.   . Anion gap 08/07/2014 13  5 - 15 Final    PATHOLOGY:no new  pathology.  Urinalysis No results found for: COLORURINE, APPEARANCEUR, LABSPEC, PHURINE, GLUCOSEU, HGBUR, BILIRUBINUR, KETONESUR, PROTEINUR, UROBILINOGEN, NITRITE, LEUKOCYTESUR  RADIOGRAPHIC STUDIES: No results found. Last mammogram done at [facility] in June 2015 was unremarkable for recurrent or new disease.  ASSESSMENT:  #1. Noninvasive ductal neoplasia the right breast, no evidence of disease. #2. Hypothyroidism, normalization of TSH after changing dose of Synthroid. #3. Hypertension, controlled. #4. Upper extremity cramping.  PLAN:  #1. Trial of 8 ounces of tonic water nightly to help with cramping. #2. Continue tamoxifen 20 mg daily. #3. Bilateral mammogram in June 2016 at [facility]. #4. Follow-up in 6 months with CBC, chem profile. Lipid profile was done today.   All questions were answered. The patient knows to call  the clinic with any problems, questions or concerns. We can certainly see the patient much sooner if necessary.   I spent 25 minutes counseling the patient face to face. The total time spent in the appointment was 30 minutes.    Doroteo Bradford, MD 08/07/2014 11:08 AM  DISCLAIMER:  This note was dictated with voice recognition software.  Similar sounding words can inadvertently be transcribed inaccurately and may not be corrected upon review.

## 2014-08-07 NOTE — Progress Notes (Signed)
LABS FOR CBCD,CMP,TSH

## 2014-10-15 ENCOUNTER — Other Ambulatory Visit (HOSPITAL_COMMUNITY): Payer: Self-pay | Admitting: Oncology

## 2014-10-15 ENCOUNTER — Other Ambulatory Visit (HOSPITAL_COMMUNITY): Payer: Self-pay | Admitting: Hematology and Oncology

## 2014-10-15 DIAGNOSIS — D0511 Intraductal carcinoma in situ of right breast: Secondary | ICD-10-CM

## 2014-10-15 MED ORDER — TAMOXIFEN CITRATE 20 MG PO TABS: 20.0000 mg | ORAL_TABLET | Freq: Every day | ORAL | Status: DC

## 2015-02-04 ENCOUNTER — Other Ambulatory Visit (HOSPITAL_COMMUNITY): Payer: Medicare Other

## 2015-02-04 ENCOUNTER — Ambulatory Visit (HOSPITAL_COMMUNITY): Payer: Medicare Other

## 2015-02-05 ENCOUNTER — Other Ambulatory Visit (HOSPITAL_COMMUNITY): Payer: Medicare Other

## 2015-02-05 ENCOUNTER — Ambulatory Visit (HOSPITAL_COMMUNITY): Payer: Medicare Other | Admitting: Hematology & Oncology

## 2015-02-11 ENCOUNTER — Other Ambulatory Visit (HOSPITAL_COMMUNITY): Payer: Self-pay

## 2015-02-11 DIAGNOSIS — D0510 Intraductal carcinoma in situ of unspecified breast: Secondary | ICD-10-CM

## 2015-02-17 ENCOUNTER — Encounter (HOSPITAL_COMMUNITY): Payer: Medicare Other | Attending: Hematology & Oncology | Admitting: Hematology & Oncology

## 2015-02-17 ENCOUNTER — Encounter (HOSPITAL_BASED_OUTPATIENT_CLINIC_OR_DEPARTMENT_OTHER): Payer: Medicare Other

## 2015-02-17 ENCOUNTER — Encounter (HOSPITAL_COMMUNITY): Payer: Self-pay | Admitting: Hematology & Oncology

## 2015-02-17 VITALS — BP 154/69 | HR 67 | Temp 97.8°F | Resp 18 | Wt 152.8 lb

## 2015-02-17 DIAGNOSIS — E039 Hypothyroidism, unspecified: Secondary | ICD-10-CM | POA: Insufficient documentation

## 2015-02-17 DIAGNOSIS — D0510 Intraductal carcinoma in situ of unspecified breast: Secondary | ICD-10-CM

## 2015-02-17 DIAGNOSIS — D0511 Intraductal carcinoma in situ of right breast: Secondary | ICD-10-CM

## 2015-02-17 LAB — CBC WITH DIFFERENTIAL/PLATELET
BASOS ABS: 0 10*3/uL (ref 0.0–0.1)
Basophils Relative: 0 % (ref 0–1)
Eosinophils Absolute: 0.1 10*3/uL (ref 0.0–0.7)
Eosinophils Relative: 2 % (ref 0–5)
HEMATOCRIT: 35.8 % — AB (ref 36.0–46.0)
Hemoglobin: 11.6 g/dL — ABNORMAL LOW (ref 12.0–15.0)
Lymphocytes Relative: 30 % (ref 12–46)
Lymphs Abs: 1.8 10*3/uL (ref 0.7–4.0)
MCH: 28.2 pg (ref 26.0–34.0)
MCHC: 32.4 g/dL (ref 30.0–36.0)
MCV: 86.9 fL (ref 78.0–100.0)
Monocytes Absolute: 0.4 10*3/uL (ref 0.1–1.0)
Monocytes Relative: 8 % (ref 3–12)
NEUTROS ABS: 3.5 10*3/uL (ref 1.7–7.7)
Neutrophils Relative %: 60 % (ref 43–77)
Platelets: 180 10*3/uL (ref 150–400)
RBC: 4.12 MIL/uL (ref 3.87–5.11)
RDW: 13.5 % (ref 11.5–15.5)
WBC: 5.9 10*3/uL (ref 4.0–10.5)

## 2015-02-17 LAB — COMPREHENSIVE METABOLIC PANEL
ALBUMIN: 3.8 g/dL (ref 3.5–5.0)
ALK PHOS: 39 U/L (ref 38–126)
ALT: 17 U/L (ref 14–54)
AST: 19 U/L (ref 15–41)
Anion gap: 9 (ref 5–15)
BILIRUBIN TOTAL: 0.6 mg/dL (ref 0.3–1.2)
BUN: 14 mg/dL (ref 6–20)
CO2: 28 mmol/L (ref 22–32)
Calcium: 8.7 mg/dL — ABNORMAL LOW (ref 8.9–10.3)
Chloride: 103 mmol/L (ref 101–111)
Creatinine, Ser: 0.81 mg/dL (ref 0.44–1.00)
GFR calc Af Amer: >60 mL/min (ref >60)
GFR calc non Af Amer: >60 mL/min (ref >60)
Glucose, Bld: 106 mg/dL — ABNORMAL HIGH (ref 65–99)
POTASSIUM: 4.1 mmol/L (ref 3.5–5.1)
Sodium: 140 mmol/L (ref 135–145)
TOTAL PROTEIN: 6.5 g/dL (ref 6.5–8.1)

## 2015-02-17 LAB — TSH: TSH: 1.459 u[IU]/mL (ref 0.350–4.500)

## 2015-02-17 NOTE — Progress Notes (Signed)
[place] at [place] / [place] [address]  Patient Care Team: Octavio Graves, DO as PCP - General Gatha Mayer, MD as Consulting Physician (Radiation Oncology) Rolm Bookbinder, MD as Consulting Physician (General Surgery) Gari Crown, MD (Unknown Physician Specialty) Santo Held, MD (Unknown Physician Specialty)  DIAGNOSIS:  DCIS right breast Status post right lumpectomy 2013, Adjuvant XRT Tamoxifen 20 mg December 2013  HISTORY OF PRESENTING ILLNESS:  Angelica Grimes 80 y.o. female is here because of DCIS of the right breast. She complains of back pain starting on Friday, saw by a doctor Monday where they gave her a steroid shot and by Wednesday the pain was gone. Felt this pain before about 10-12 years ago, was given a steroid shot then as well which fixed it.  She is taking blood pressure med but it doesn't seem to keep it regular. Every time she goes to the doctor it is always seems elevated. Given Lisinopril about 2 months ago to take at night.   She does her mammograms in [place], the last one being done last June. She is not currently scheduled for one this year.  She comes from [place]. She is interested in having her mammograms done here next year and is interested in 3D mammography.  She has never had a hysterectomy. Will be seeing Dr. Derenda Mis for a gynecology appointment in the near future.  She is not experiencing any issues with the Tamoxifen.  Screening colonoscopy several years ago, "maybe 6-7 years ago" and has had a total of 2 of them.  Skin changes regular for sun exposure and aging, these are not concerning. Started popping up 3-4 years ago. She sees a dermatologist regularly.   MEDICAL HISTORY:  Past Medical History  Diagnosis Date  . Hypertension   . Hyperlipidemia   . Cancer     breast  . Cancer     thyroid  . GERD (gastroesophageal reflux disease)   . IBS (irritable bowel syndrome)   .  Hypothyroidism   . Anxiety     SURGICAL HISTORY: Past Surgical History  Procedure Laterality Date  . Bunionectomy  2012    right foot  . Cystectomy  1983  . Thyroidectomy  1991    due to cancer  . Tonsillectomy    . Colonoscopy    . Breast cyst surgery       x 2 - benign   . Breast surgery  1966, 1991, 1998    biopsy  . Breast lumpectomy      right breast with re-excision    SOCIAL HISTORY: History   Social History  . Marital Status: Widowed    Spouse Name: N/A  . Number of Children: N/A  . Years of Education: N/A   Occupational History  . Not on file.   Social History Main Topics  . Smoking status: Never Smoker   . Smokeless tobacco: Never Used  . Alcohol Use: Yes     Comment: rare  . Drug Use: No  . Sexual Activity: Not on file   Other Topics Concern  . Not on file   Social History Narrative    FAMILY HISTORY: Family History  Problem Relation Age of Onset  . Stroke Mother   . Pneumonia Mother   . Cancer Father     bladder, prostate  . Cancer Sister     kidney  . Cancer Brother     lung  . Cancer Sister  kidney  . Cancer Sister     lung   indicated that her mother is deceased. She indicated that her father is deceased. She indicated that all of her three sisters are alive. She indicated that her brother is deceased.     ALLERGIES:  is allergic to shrimp; aspirin; benazepril; penicillins; clonidine derivatives; and orange fruit.  MEDICATIONS:  Current Outpatient Prescriptions  Medication Sig Dispense Refill  . ALPRAZolam (XANAX) 0.25 MG tablet Take 0.25 mg by mouth as needed. anxiety    . CRESTOR 10 MG tablet 10 mg daily.     . Glucosamine-Chondroitin (OSTEO BI-FLEX REGULAR STRENGTH PO) Take 2 tablets by mouth daily.    . hydrALAZINE (APRESOLINE) 25 MG tablet Take 25 mg by mouth 3 (three) times daily.     . hydrochlorothiazide (HYDRODIURIL) 25 MG tablet Take 25 mg by mouth daily with breakfast.     . Multiple Minerals-Vitamins  (CALCIUM-MAGNESIUM-ZINC-D3 PO) Take 1 tablet by mouth daily.    Marland Kitchen omeprazole (PRILOSEC) 20 MG capsule Take 20 mg by mouth daily.     . potassium chloride SA (K-DUR,KLOR-CON) 20 MEQ tablet Take 20 mEq by mouth daily.     Marland Kitchen SYNTHROID 75 MCG tablet Take 88 mcg by mouth daily with breakfast.     . tamoxifen (NOLVADEX) 20 MG tablet Take 1 tablet (20 mg total) by mouth daily. 90 tablet 3  . aspirin EC 81 MG tablet Take 81 mg by mouth daily.    Marland Kitchen lisinopril (PRINIVIL,ZESTRIL) 10 MG tablet      No current facility-administered medications for this visit.    Review of Systems  Constitutional: Negative for fever, chills, weight loss and malaise/fatigue.  HENT: Negative for congestion, hearing loss, nosebleeds, sore throat and tinnitus.   Eyes: Negative for blurred vision, double vision, pain and discharge.  Respiratory: Negative for cough, hemoptysis, sputum production, shortness of breath and wheezing.   Cardiovascular: Negative for chest pain, palpitations, claudication, leg swelling and PND.  Gastrointestinal: Negative for heartburn, nausea, vomiting, abdominal pain, diarrhea, constipation, blood in stool and melena.  Genitourinary: Negative for dysuria, urgency, frequency and hematuria.  Musculoskeletal: Positive for back pain. Negative for myalgias, joint pain and falls.       Back pain recently, was treated with a steroid injection and the pain went away within a few days.  Takes Glucosamine to help alleviate joint pain.  Skin: Negative for itching and rash.  Neurological: Negative for dizziness, tingling, tremors, sensory change, speech change, focal weakness, seizures, loss of consciousness, weakness and headaches.  Endo/Heme/Allergies: Does not bruise/bleed easily.  Psychiatric/Behavioral: Negative for depression, suicidal ideas, memory loss and substance abuse. The patient is not nervous/anxious and does not have insomnia.    14 point ROS was done and is otherwise as detailed above or in  HPI   PHYSICAL EXAMINATION:  ECOG PERFORMANCE STATUS: 0 - Asymptomatic  Filed Vitals:   02/17/15 0945  BP: 154/69  Pulse: 67  Temp: 97.8 F (36.6 C)  Resp: 18   Filed Weights   02/17/15 0945  Weight: 152 lb 12.8 oz (69.31 kg)     Physical Exam  Constitutional: She is oriented to person, place, and time and well-developed, well-nourished, and in no distress.  HENT:  Head: Normocephalic and atraumatic.  Nose: Nose normal.  Mouth/Throat: Oropharynx is clear and moist.  Eyes: Conjunctivae and EOM are normal. Pupils are equal, round, and reactive to light.  Neck: Normal range of motion. Neck supple.  Cardiovascular: Normal rate, regular rhythm  and normal heart sounds.   Pulmonary/Chest: Effort normal and breath sounds normal. She exhibits tenderness.    Abdominal: Soft. Bowel sounds are normal.  Musculoskeletal: Normal range of motion.  Neurological: She is alert and oriented to person, place, and time.  Skin: Skin is warm and dry.       LABORATORY DATA:  I have reviewed the data as listed Lab Results  Component Value Date   WBC 5.9 02/17/2015   HGB 11.6* 02/17/2015   HCT 35.8* 02/17/2015   MCV 86.9 02/17/2015   PLT 180 02/17/2015   Results for SANAH, KRASKA (MRN [id number])   Ref. Range 02/17/2015 09:43  Sodium Latest Ref Range: 135-145 mmol/L 140  Potassium Latest Ref Range: 3.5-5.1 mmol/L 4.1  Chloride Latest Ref Range: 101-111 mmol/L 103  CO2 Latest Ref Range: 22-32 mmol/L 28  BUN Latest Ref Range: 6-20 mg/dL 14  Creatinine Latest Ref Range: 0.44-1.00 mg/dL 0.81  Calcium Latest Ref Range: 8.9-10.3 mg/dL 8.7 (L)  EGFR (Non-African Amer.) Latest Ref Range: >60 mL/min >60  EGFR (African American) Latest Ref Range: >60 mL/min >60  Glucose Latest Ref Range: 65-99 mg/dL 106 (H)  Anion gap Latest Ref Range: 5-15  9  Alkaline Phosphatase Latest Ref Range: 38-126 U/L 39  Albumin Latest Ref Range: 3.5-5.0 g/dL 3.8  AST Latest Ref Range: 15-41 U/L 19  ALT  Latest Ref Range: 14-54 U/L 17  Total Protein Latest Ref Range: 6.5-8.1 g/dL 6.5  Total Bilirubin Latest Ref Range: 0.3-1.2 mg/dL 0.6   ASSESSMENT & PLAN:  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 She is tolerating her tamoxifen well and will continue it daily.  She is up to date on her well care and will schedule her mammogram in [place].  Orders Placed This Encounter  Procedures  . CBC with Differential    Standing Status: Future     Number of Occurrences:      Standing Expiration Date: 02/17/2016  . Comprehensive metabolic panel    Standing Status: Future     Number of Occurrences:      Standing Expiration Date: 02/17/2016    All questions were answered. The patient knows to call the clinic with any problems, questions or concerns.  This note was electronically signed.    This document serves as a record of services personally performed by Ancil Linsey, MD. It was created on her behalf by Arlyce Harman, a trained medical scribe. The creation of this record is based on the scribe's personal observations and the provider's statements to them. This document has been checked and approved by the attending provider.  I have reviewed the above documentation for accuracy and completeness, and I agree with the above.  Molli Hazard, MD

## 2015-02-17 NOTE — Progress Notes (Signed)
Labs drawn

## 2015-02-17 NOTE — Patient Instructions (Signed)
..  [place] at [facility] Discharge Instructions  RECOMMENDATIONS MADE BY THE CONSULTANT AND ANY TEST RESULTS WILL BE SENT TO YOUR REFERRING PHYSICIAN.  Return in 6 months for labs and Dr. visit  Thank you for choosing [place] at [facility] to provide your oncology and hematology care.  To afford each patient quality time with our provider, please arrive at least 15 minutes before your scheduled appointment time.    You need to re-schedule your appointment should you arrive 10 or more minutes late.  We strive to give you quality time with our providers, and arriving late affects you and other patients whose appointments are after yours.  Also, if you no show three or more times for appointments you may be dismissed from the clinic at the providers discretion.     Again, thank you for choosing [facility].  Our hope is that these requests will decrease the amount of time that you wait before being seen by our physicians.       _____________________________________________________________  Should you have questions after your visit to [facility], please contact our office at [phone] between the hours of 8:30 a.m. and 4:30 p.m.  Voicemails left after 4:30 p.m. will not be returned until the following business day.  For prescription refill requests, have your pharmacy contact our office.

## 2015-03-11 ENCOUNTER — Encounter (HOSPITAL_COMMUNITY): Payer: Self-pay | Admitting: Hematology & Oncology

## 2015-04-28 ENCOUNTER — Encounter: Payer: Self-pay | Admitting: Hematology & Oncology

## 2015-09-01 ENCOUNTER — Encounter (HOSPITAL_BASED_OUTPATIENT_CLINIC_OR_DEPARTMENT_OTHER): Payer: Medicare Other

## 2015-09-01 ENCOUNTER — Ambulatory Visit (HOSPITAL_COMMUNITY)
Admission: RE | Admit: 2015-09-01 | Discharge: 2015-09-01 | Disposition: A | Discharge status: Home / Self Care | Payer: Medicare Other | Source: Ambulatory Visit | Attending: Hematology & Oncology | Admitting: Hematology & Oncology

## 2015-09-01 ENCOUNTER — Encounter (HOSPITAL_COMMUNITY): Payer: Medicare Other | Attending: Hematology & Oncology | Admitting: Hematology & Oncology

## 2015-09-01 ENCOUNTER — Encounter (HOSPITAL_COMMUNITY): Payer: Self-pay | Admitting: Hematology & Oncology

## 2015-09-01 VITALS — BP 158/59 | HR 72 | Temp 97.9°F | Resp 18 | Wt 153.7 lb

## 2015-09-01 DIAGNOSIS — D0511 Intraductal carcinoma in situ of right breast: Secondary | ICD-10-CM

## 2015-09-01 DIAGNOSIS — M545 Low back pain: Secondary | ICD-10-CM | POA: Diagnosis not present

## 2015-09-01 DIAGNOSIS — Z79811 Long term (current) use of aromatase inhibitors: Secondary | ICD-10-CM

## 2015-09-01 DIAGNOSIS — M549 Dorsalgia, unspecified: Secondary | ICD-10-CM | POA: Diagnosis not present

## 2015-09-01 DIAGNOSIS — M5442 Lumbago with sciatica, left side: Secondary | ICD-10-CM

## 2015-09-01 LAB — URINALYSIS, ROUTINE W REFLEX MICROSCOPIC
BILIRUBIN URINE: NEGATIVE
Glucose, UA: NEGATIVE mg/dL
HGB URINE DIPSTICK: NEGATIVE
KETONES UR: NEGATIVE mg/dL
Leukocytes, UA: NEGATIVE
NITRITE: NEGATIVE
PH: 7 (ref 5.0–8.0)
Protein, ur: NEGATIVE mg/dL
Specific Gravity, Urine: 1.01 (ref 1.005–1.030)

## 2015-09-01 LAB — CBC WITH DIFFERENTIAL/PLATELET
Basophils Absolute: 0 10*3/uL (ref 0.0–0.1)
Basophils Relative: 0 %
Eosinophils Absolute: 0.1 10*3/uL (ref 0.0–0.7)
Eosinophils Relative: 2 %
HCT: 34.8 % — ABNORMAL LOW (ref 36.0–46.0)
HEMOGLOBIN: 11.5 g/dL — AB (ref 12.0–15.0)
LYMPHS ABS: 1.7 10*3/uL (ref 0.7–4.0)
LYMPHS PCT: 30 %
MCH: 28.5 pg (ref 26.0–34.0)
MCHC: 33 g/dL (ref 30.0–36.0)
MCV: 86.4 fL (ref 78.0–100.0)
Monocytes Absolute: 0.4 10*3/uL (ref 0.1–1.0)
Monocytes Relative: 7 %
Neutro Abs: 3.6 10*3/uL (ref 1.7–7.7)
Neutrophils Relative %: 61 %
Platelets: 191 10*3/uL (ref 150–400)
RBC: 4.03 MIL/uL (ref 3.87–5.11)
RDW: 13.2 % (ref 11.5–15.5)
WBC: 5.8 10*3/uL (ref 4.0–10.5)

## 2015-09-01 LAB — COMPREHENSIVE METABOLIC PANEL
ALT: 18 U/L (ref 14–54)
AST: 20 U/L (ref 15–41)
Albumin: 3.8 g/dL (ref 3.5–5.0)
Alkaline Phosphatase: 39 U/L (ref 38–126)
Anion gap: 8 (ref 5–15)
BUN: 15 mg/dL (ref 6–20)
CALCIUM: 8.6 mg/dL — AB (ref 8.9–10.3)
CO2: 28 mmol/L (ref 22–32)
Chloride: 101 mmol/L (ref 101–111)
Creatinine, Ser: 0.82 mg/dL (ref 0.44–1.00)
GFR calc non Af Amer: >60 mL/min (ref >60)
Glucose, Bld: 98 mg/dL (ref 65–99)
Potassium: 3.7 mmol/L (ref 3.5–5.1)
Sodium: 137 mmol/L (ref 135–145)
Total Bilirubin: 0.6 mg/dL (ref 0.3–1.2)
Total Protein: 6.4 g/dL — ABNORMAL LOW (ref 6.5–8.1)

## 2015-09-01 NOTE — Patient Instructions (Addendum)
[place] at [facility] Discharge Instructions  RECOMMENDATIONS MADE BY THE CONSULTANT AND ANY TEST RESULTS WILL BE SENT TO YOUR REFERRING PHYSICIAN.   Exam completed by Dr Whitney Muse today Mammogram scheduled for next year Urinalysis today  X-rays today when you leave just stop by radiology  Continue taking tamoxifen daily  Return to see the doctor in 6 months Please call the clinic if you have any questions or concerns    Thank you for choosing [place] at [facility] to provide your oncology and hematology care.  To afford each patient quality time with our provider, please arrive at least 15 minutes before your scheduled appointment time.    You need to re-schedule your appointment should you arrive 10 or more minutes late.  We strive to give you quality time with our providers, and arriving late affects you and other patients whose appointments are after yours.  Also, if you no show three or more times for appointments you may be dismissed from the clinic at the providers discretion.     Again, thank you for choosing [facility].  Our hope is that these requests will decrease the amount of time that you wait before being seen by our physicians.       _____________________________________________________________  Should you have questions after your visit to [facility], please contact our office at [phone] between the hours of 8:30 a.m. and 4:30 p.m.  Voicemails left after 4:30 p.m. will not be returned until the following business day.  For prescription refill requests, have your pharmacy contact our office.

## 2015-09-01 NOTE — Progress Notes (Signed)
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 female is here because of DCIS of the right breast.  Ms. Cangiano is here alone today.  She reports nothing new except that her hip's been bothering her. She says it started about 2 weeks ago. She says nothing out of the ordinary or new activity could be causing it; she says "I'm lazy."  She's had hip or back pain 2 years ago when she got a shot of cortizone. She says it wasn't the same; this pain is different. She says it feels like it's in both of her hip bones, but mainly in the back.  She doesn't know if she has arthritis; she says no one has said whether she has arthritis.  She denies any shooting pain down her leg or any numbness in her foot. She says when she stands up is when it hurts the most. Right now, as she's sitting, she says she knows it's there but it's not bad. She's used blue emu on it. She says it hurts sometimes when she lays back.  She denies any burning during urination.  She denies any problems with the Tamoxifen.  She reports a "soft bulge" under both of her armpits, but "it's just fat."  She denies nausea and vomiting, constipation, diarrhea, change in appetite or energy level.   MEDICAL HISTORY:  Past Medical History  Diagnosis Date  . Hypertension   . Hyperlipidemia   . Cancer ([place])     breast  . Cancer ([place])     thyroid  . GERD (gastroesophageal reflux disease)   . IBS (irritable bowel  syndrome)   . Hypothyroidism   . Anxiety     SURGICAL HISTORY: Past Surgical History  Procedure Laterality Date  . Bunionectomy  2012    right foot  . Cystectomy  1983  . Thyroidectomy  1991    due to cancer  . Tonsillectomy    . Colonoscopy    . Breast cyst surgery       x 2 - benign   . Breast surgery  1966, 1991, 1998    biopsy  . Breast lumpectomy      right breast with re-excision    SOCIAL HISTORY: Social History   Social History  . Marital Status: Widowed    Spouse Name: N/A  . Number of Children: N/A  . Years of Education: N/A   Occupational History  . Not on file.   Social History Main Topics  . Smoking status: Never Smoker   . Smokeless tobacco: Never Used  . Alcohol Use: Yes     Comment: rare  . Drug Use: No  . Sexual Activity: Not on file   Other Topics Concern  . Not on file   Social History Narrative    FAMILY HISTORY: Family History  Problem Relation Age of Onset  . Stroke Mother   . Pneumonia Mother   . Cancer Father     bladder, prostate  . Cancer Sister     kidney  .  Cancer Brother     lung  . Cancer Sister     kidney  . Cancer Sister     lung   indicated that her mother is deceased. She indicated that her father is deceased. She indicated that all of her three sisters are alive. She indicated that her brother is deceased.     ALLERGIES:  is allergic to shrimp; aspirin; benazepril; penicillins; clonidine derivatives; and orange fruit.  MEDICATIONS:  Current Outpatient Prescriptions  Medication Sig Dispense Refill  . ALPRAZolam (XANAX) 0.25 MG tablet Take 0.25 mg by mouth as needed. anxiety    . CALCIUM-MAGNESIUM-ZINC PO Take 2 tablets by mouth daily.    Marland Kitchen co-enzyme Q-10 50 MG capsule Take 100 mg by mouth daily.    . CRESTOR 10 MG tablet 10 mg daily.     . Glucosamine-Chondroitin (OSTEO BI-FLEX REGULAR STRENGTH PO) Take 2 tablets by mouth daily.    . hydrALAZINE (APRESOLINE) 25 MG tablet Take 25 mg by mouth 3 (three) times  daily.     . hydrochlorothiazide (HYDRODIURIL) 25 MG tablet Take 25 mg by mouth daily with breakfast.     . lisinopril (PRINIVIL,ZESTRIL) 10 MG tablet     . omeprazole (PRILOSEC) 20 MG capsule Take 20 mg by mouth daily.     . potassium chloride SA (K-DUR,KLOR-CON) 20 MEQ tablet Take 20 mEq by mouth daily.     Marland Kitchen SYNTHROID 75 MCG tablet Take 88 mcg by mouth daily with breakfast.     . tamoxifen (NOLVADEX) 20 MG tablet Take 1 tablet (20 mg total) by mouth daily. 90 tablet 3  . aspirin EC 81 MG tablet Take 81 mg by mouth daily.    . Multiple Minerals-Vitamins (CALCIUM-MAGNESIUM-ZINC-D3 PO) Take 1 tablet by mouth daily.     No current facility-administered medications for this visit.    Review of Systems  Constitutional: Negative for fever, chills, weight loss and malaise/fatigue.  HENT: Negative for congestion, hearing loss, nosebleeds, sore throat and tinnitus.   Eyes: Negative for blurred vision, double vision, pain and discharge.  Respiratory: Negative for cough, hemoptysis, sputum production, shortness of breath and wheezing.   Cardiovascular: Negative for chest pain, palpitations, claudication, leg swelling and PND.  Gastrointestinal: Negative for heartburn, nausea, vomiting, abdominal pain, diarrhea, constipation, blood in stool and melena.  Genitourinary: Negative for dysuria, urgency, frequency and hematuria.  Musculoskeletal: Positive for back pain. Negative for myalgias, joint pain and falls.       Back pain recently, was treated with a steroid injection and the pain went away within a few days. Takes Glucosamine to help alleviate joint pain.  Skin: Negative for itching and rash.  Neurological: Negative for dizziness, tingling, tremors, sensory change, speech change, focal weakness, seizures, loss of consciousness, weakness and headaches.  Endo/Heme/Allergies: Does not bruise/bleed easily.  Psychiatric/Behavioral: Negative for depression, suicidal ideas, memory loss and substance  abuse. The patient is not nervous/anxious and does not have insomnia.    14 point ROS was done and is otherwise as detailed above or in HPI   PHYSICAL EXAMINATION:  ECOG PERFORMANCE STATUS: 0 - Asymptomatic  Filed Vitals:   09/01/15 1140  BP: 158/59  Pulse: 72  Temp: 97.9 F (36.6 C)  Resp: 18   Filed Weights   09/01/15 1140  Weight: 153 lb 11.2 oz (69.718 kg)    Physical Exam  Constitutional: She is oriented to person, place, and time and well-developed, well-nourished, and in no distress.  HENT:  Head: Normocephalic and atraumatic.  Nose: Nose normal.  Mouth/Throat: Oropharynx is clear and moist.  Eyes: Conjunctivae and EOM are normal. Pupils are equal, round, and reactive to light.  Neck: Normal range of motion. Neck supple.  Cardiovascular: Normal rate, regular rhythm and normal heart sounds.   Pulmonary/Chest: Effort normal and breath sounds normal. She exhibits tenderness.    Abdominal: Soft. Bowel sounds are normal.  Musculoskeletal: Normal range of motion.  Neurological: She is alert and oriented to person, place, and time.  Skin: Skin is warm and dry.      LABORATORY DATA:  I have reviewed the data as listed Lab Results  Component Value Date   WBC 5.8 09/01/2015   HGB 11.5* 09/01/2015   HCT 34.8* 09/01/2015   MCV 86.4 09/01/2015   PLT 191 09/01/2015   CBC    Component Value Date/Time   WBC 5.8 09/01/2015 1107   RBC 4.03 09/01/2015 1107   HGB 11.5* 09/01/2015 1107   HCT 34.8* 09/01/2015 1107   PLT 191 09/01/2015 1107   MCV 86.4 09/01/2015 1107   MCH 28.5 09/01/2015 1107   MCHC 33.0 09/01/2015 1107   RDW 13.2 09/01/2015 1107   LYMPHSABS 1.7 09/01/2015 1107   MONOABS 0.4 09/01/2015 1107   EOSABS 0.1 09/01/2015 1107   BASOSABS 0.0 09/01/2015 1107   CMP     Component Value Date/Time   NA 137 09/01/2015 1107   K 3.7 09/01/2015 1107   CL 101 09/01/2015 1107   CO2 28 09/01/2015 1107   GLUCOSE 98 09/01/2015 1107   BUN 15 09/01/2015 1107    CREATININE 0.82 09/01/2015 1107   CALCIUM 8.6* 09/01/2015 1107   PROT 6.4* 09/01/2015 1107   ALBUMIN 3.8 09/01/2015 1107   AST 20 09/01/2015 1107   ALT 18 09/01/2015 1107   ALKPHOS 39 09/01/2015 1107   BILITOT 0.6 09/01/2015 1107   GFRNONAA >60 09/01/2015 1107   GFRAA >60 09/01/2015 1107   ASSESSMENT & PLAN:  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 She is tolerating her tamoxifen well and will continue it daily.  She is up to date on her well care and will schedule her mammogram in July.  Given her new "hip/LBP" that differs from her chronic pain, I have recommended a U/A and Lumbar spine films today. She is agreeable. We will notify her of the results when available.   She does not need any refills today.  Orders Placed This Encounter  Procedures  . DG Lumbar Spine Complete    Add on, order in sys and esigned, nbs    Standing Status: Future     Number of Occurrences: 1     Standing Expiration Date: 08/31/2016    Order Specific Question:  Reason for Exam (SYMPTOM  OR DIAGNOSIS REQUIRED)    Answer:  new onset back pain    Order Specific Question:  Preferred imaging location?    Answer:  [facility]    Standing Status: Future     Number of Occurrences:      Standing Expiration Date: 11/07/2016    Order Specific Question:  Reason for Exam (SYMPTOM  OR DIAGNOSIS REQUIRED)    Answer:  rt breast cancer    Order Specific Question:  Preferred imaging location?    Answer:  [facility]  . Urinalysis, Routine w reflex microscopic  Standing Status: Future     Number of Occurrences: 1     Standing Expiration Date: 08/31/2016    All questions were answered. The patient knows to call the clinic with any problems, questions or concerns.  This note was electronically signed.    This document serves as a  record of services personally performed by Ancil Linsey, MD. It was created on her behalf by Toni Amend, a trained medical scribe. The creation of this record is based on the scribe's personal observations and the provider's statements to them. This document has been checked and approved by the attending provider.  I have reviewed the above documentation for accuracy and completeness, and I agree with the above.  Molli Hazard, MD

## 2015-09-02 NOTE — Progress Notes (Signed)
LABS DRAWN

## 2015-09-03 NOTE — Progress Notes (Signed)
Refer patient to Dr. Aline Brochure (ortho) for back pain, abnormal Xrays of lumbar spine. Dr.P

## 2015-09-20 ENCOUNTER — Ambulatory Visit (HOSPITAL_COMMUNITY): Payer: Medicare Other

## 2015-11-19 ENCOUNTER — Other Ambulatory Visit (HOSPITAL_COMMUNITY): Payer: Self-pay | Admitting: Oncology

## 2016-02-29 ENCOUNTER — Encounter (HOSPITAL_COMMUNITY): Payer: Self-pay | Admitting: Hematology & Oncology

## 2016-02-29 ENCOUNTER — Encounter (HOSPITAL_COMMUNITY): Payer: Medicare Other

## 2016-02-29 ENCOUNTER — Encounter (HOSPITAL_COMMUNITY): Payer: Medicare Other | Attending: Hematology & Oncology | Admitting: Hematology & Oncology

## 2016-02-29 VITALS — BP 169/67 | HR 62 | Temp 98.0°F | Resp 16 | Wt 146.8 lb

## 2016-02-29 DIAGNOSIS — R634 Abnormal weight loss: Secondary | ICD-10-CM | POA: Diagnosis not present

## 2016-02-29 DIAGNOSIS — D649 Anemia, unspecified: Secondary | ICD-10-CM | POA: Diagnosis not present

## 2016-02-29 DIAGNOSIS — Z0000 Encounter for general adult medical examination without abnormal findings: Secondary | ICD-10-CM | POA: Diagnosis not present

## 2016-02-29 DIAGNOSIS — D0511 Intraductal carcinoma in situ of right breast: Secondary | ICD-10-CM

## 2016-02-29 LAB — LIPID PANEL
CHOL/HDL RATIO: 2.8 ratio
CHOLESTEROL: 141 mg/dL (ref 0–200)
HDL: 51 mg/dL (ref >40)
LDL Cholesterol: 67 mg/dL (ref 0–99)
TRIGLYCERIDES: 117 mg/dL (ref <150)
VLDL: 23 mg/dL (ref 0–40)

## 2016-02-29 LAB — COMPREHENSIVE METABOLIC PANEL
ALT: 16 U/L (ref 14–54)
AST: 22 U/L (ref 15–41)
Albumin: 3.9 g/dL (ref 3.5–5.0)
Alkaline Phosphatase: 33 U/L — ABNORMAL LOW (ref 38–126)
Anion gap: 8 (ref 5–15)
BUN: 15 mg/dL (ref 6–20)
CHLORIDE: 101 mmol/L (ref 101–111)
CO2: 28 mmol/L (ref 22–32)
Calcium: 8.9 mg/dL (ref 8.9–10.3)
Creatinine, Ser: 0.79 mg/dL (ref 0.44–1.00)
Glucose, Bld: 106 mg/dL — ABNORMAL HIGH (ref 65–99)
POTASSIUM: 3.6 mmol/L (ref 3.5–5.1)
Sodium: 137 mmol/L (ref 135–145)
TOTAL PROTEIN: 6.7 g/dL (ref 6.5–8.1)
Total Bilirubin: 0.5 mg/dL (ref 0.3–1.2)

## 2016-02-29 LAB — CBC WITH DIFFERENTIAL/PLATELET
Basophils Absolute: 0 10*3/uL (ref 0.0–0.1)
Basophils Relative: 0 %
EOS PCT: 2 %
Eosinophils Absolute: 0.1 10*3/uL (ref 0.0–0.7)
HCT: 35.3 % — ABNORMAL LOW (ref 36.0–46.0)
Hemoglobin: 11.6 g/dL — ABNORMAL LOW (ref 12.0–15.0)
LYMPHS ABS: 1.6 10*3/uL (ref 0.7–4.0)
LYMPHS PCT: 26 %
MCH: 28.5 pg (ref 26.0–34.0)
MCHC: 32.9 g/dL (ref 30.0–36.0)
MCV: 86.7 fL (ref 78.0–100.0)
MONO ABS: 0.5 10*3/uL (ref 0.1–1.0)
MONOS PCT: 8 %
Neutro Abs: 4.1 10*3/uL (ref 1.7–7.7)
Neutrophils Relative %: 64 %
PLATELETS: 175 10*3/uL (ref 150–400)
RBC: 4.07 MIL/uL (ref 3.87–5.11)
RDW: 13.7 % (ref 11.5–15.5)
WBC: 6.3 10*3/uL (ref 4.0–10.5)

## 2016-02-29 LAB — TSH: TSH: 0.813 u[IU]/mL (ref 0.350–4.500)

## 2016-02-29 NOTE — Patient Instructions (Signed)
[place] at [facility] Discharge Instructions  RECOMMENDATIONS MADE BY THE CONSULTANT AND ANY TEST RESULTS WILL BE SENT TO YOUR REFERRING PHYSICIAN.  Return to clinic in 7 months with labs  Mammogram in July    Thank you for choosing [place] at [facility] to provide your oncology and hematology care.  To afford each patient quality time with our provider, please arrive at least 15 minutes before your scheduled appointment time.   Beginning January 23rd 2017 lab work for the [facility] will be done in the  Main lab at [facility] on 1st floor. If you have a lab appointment with the [facility] please come in thru the  Main Entrance and check in at the main information desk  You need to re-schedule your appointment should you arrive 10 or more minutes late.  We strive to give you quality time with our providers, and arriving late affects you and other patients whose appointments are after yours.  Also, if you no show three or more times for appointments you may be dismissed from the clinic at the providers discretion.     Again, thank you for choosing [facility].  Our hope is that these requests will decrease the amount of time that you wait before being seen by our physicians.       _____________________________________________________________  Should you have questions after your visit to [facility], please contact our office at [phone] between the hours of 8:30 a.m. and 4:30 p.m.  Voicemails left after 4:30 p.m. will not be returned until the following business day.  For prescription refill requests, have your pharmacy contact our office.         Resources For Cancer Patients and their Caregivers ? American Cancer Society: Can assist with transportation, wigs, general needs, runs Look Good Feel Better.        234 603 1889 ? Cancer Care: Provides financial assistance, online support groups,  medication/co-pay assistance.  1-800-813-HOPE 309-695-2729) ? [employer] Assists [place] cancer patients and their families through emotional , educational and financial support.  (725)853-5005 ? [place] DSS Where to apply for food stamps, Medicaid and utility assistance. 530-103-4466 ? [employer]: Transportation to medical appointments. (330)326-8994 ? Social Security Administration: May apply for disability if have a Stage IV cancer. (667)516-3015 (510)388-8908 ? [employer], Disability and Transit Services: Assists with nutrition, care and transit needs. [place] Support Programs: @10RELATIVEDAYS @ > Cancer Support Group  2nd Tuesday of the month 1pm-2pm, Journey Room  > Creative Journey  3rd Tuesday of the month 1130am-1pm, Journey Room  > Look Good Feel Better  1st Wednesday of the month 10am-12 noon, Journey Room (Call [place] to register [phone])

## 2016-02-29 NOTE — Progress Notes (Signed)
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 female is here because of DCIS of the right breast, this is her 6 month follow-up.   Angelica Grimes is unaccompanied and wearing a boot on the right lower extremity. I personally reviewed and went over laboratory studies with the patient.  She is concerned about her BP. She notes that she has been working with her PCP but her BP never seems to be right. She has recently changed her lisinopril to twice daily dosing that initially helped but no longer seems to be working.  Four weeks ago, she broke her right ankle on the way into [facility] hospital with her nephew. She slipped with all the rain and landed on her ankle. She has a boot on her right ankle. She admits the boot slows her down considerably.  At her last visit she complained of significant back pain, she notes that this is markedly better.   Her appetite is good. She has noticed 6 lbs weight loss and is not sure if it is due to breaking her ankle. Her appetite is unchanged, adding she enjoys eating ice cream. She denies any bowel issues. She has an upcoming mammogram in July.  MEDICAL HISTORY:  Past Medical History  Diagnosis Date  . Hypertension   . Hyperlipidemia   . Cancer ([place])     breast  . Cancer ([place])     thyroid  . GERD (gastroesophageal reflux disease)   . IBS (irritable bowel syndrome)   . Hypothyroidism   . Anxiety   . Fracture ankle fracture 01/24/16     SURGICAL HISTORY: Past Surgical History  Procedure Laterality Date  . Bunionectomy  2012    right foot  . Cystectomy  1983  . Thyroidectomy  1991    due to cancer  . Tonsillectomy    . Colonoscopy    . Breast cyst surgery       x 2 - benign   . Breast surgery  1966, 1991, 1998    biopsy  . Breast lumpectomy      right breast with re-excision    SOCIAL HISTORY: Social History   Social History  . Marital Status: Widowed    Spouse Name: N/A  . Number of Children: N/A  . Years of Education: N/A   Occupational History  . Not on file.   Social History Main Topics  . Smoking status: Never Smoker   . Smokeless tobacco: Never Used  . Alcohol Use: Yes     Comment: rare  . Drug Use: No  . Sexual Activity: Not on file   Other Topics Concern  . Not on file   Social History Narrative    FAMILY HISTORY: Family History  Problem Relation Age of Onset  . Stroke Mother   . Pneumonia Mother   . Cancer Father     bladder, prostate  . Cancer Sister     kidney  . Cancer Brother     lung  . Cancer Sister  kidney  . Cancer Sister     lung   indicated that her mother is deceased. She indicated that her father is deceased. She indicated that all of her three sisters are alive. She indicated that her brother is deceased.     ALLERGIES:  is allergic to shrimp; aspirin; benazepril; penicillins; clonidine derivatives; and orange fruit.  MEDICATIONS:  Current Outpatient Prescriptions  Medication Sig Dispense Refill  . ALPRAZolam (XANAX) 0.25 MG tablet Take 0.25 mg by mouth as needed. anxiety    . aspirin EC 81 MG tablet Take 81 mg by mouth daily.    Marland Kitchen CALCIUM-MAGNESIUM-ZINC PO Take 2 tablets by mouth daily.    Marland Kitchen co-enzyme Q-10 50 MG capsule Take 100 mg by mouth daily.    . CRESTOR 10 MG tablet 10 mg daily.     . Glucosamine-Chondroitin (OSTEO BI-FLEX REGULAR STRENGTH PO) Take 2 tablets by mouth daily.    . hydrALAZINE (APRESOLINE) 25 MG tablet Take 25 mg by mouth 3  (three) times daily.     . hydrochlorothiazide (HYDRODIURIL) 25 MG tablet Take 25 mg by mouth daily with breakfast.     . lisinopril (PRINIVIL,ZESTRIL) 10 MG tablet     . omeprazole (PRILOSEC) 20 MG capsule Take 20 mg by mouth daily.     . potassium chloride SA (K-DUR,KLOR-CON) 20 MEQ tablet Take 20 mEq by mouth daily.     Marland Kitchen SYNTHROID 75 MCG tablet Take 88 mcg by mouth daily with breakfast.     . tamoxifen (NOLVADEX) 20 MG tablet TAKE 1 TABLET BY MOUTH EVERY DAY 90 tablet 1  . Multiple Minerals-Vitamins (CALCIUM-MAGNESIUM-ZINC-D3 PO) Take 1 tablet by mouth daily. Reported on 02/29/2016     No current facility-administered medications for this visit.    Review of Systems  Constitutional: Positive for weight loss. Negative for fever, chills, and malaise/fatigue.  6 lbs weight loss. Appetite unchanged. HENT: Negative for congestion, hearing loss, nosebleeds, sore throat and tinnitus.   Eyes: Negative for blurred vision, double vision, pain and discharge.  Respiratory: Negative for cough, hemoptysis, sputum production, shortness of breath and wheezing.   Cardiovascular: Negative for chest pain, palpitations, claudication, leg swelling and PND.  Gastrointestinal: Negative for heartburn, nausea, vomiting, abdominal pain, diarrhea, constipation, blood in stool and melena.  Genitourinary: Negative for dysuria, urgency, frequency and hematuria.  Musculoskeletal: Negative for myalgias, joint pain and falls.  Skin: Negative for itching and rash.  Neurological: Negative for dizziness, tingling, tremors, sensory change, speech change, focal weakness, seizures, loss of consciousness, weakness and headaches.  Endo/Heme/Allergies: Does not bruise/bleed easily.  Psychiatric/Behavioral: Negative for depression, suicidal ideas, memory loss and substance abuse. The patient is not nervous/anxious and does not have insomnia.    14 point ROS was done and is otherwise as detailed above or in HPI   PHYSICAL  EXAMINATION:  ECOG PERFORMANCE STATUS: 0 - Asymptomatic  Filed Vitals:   02/29/16 1040  BP: 169/67  Pulse: 62  Temp: 98 F (36.7 C)  Resp: 16   Filed Weights   02/29/16 1040  Weight: 146 lb 12.8 oz (66.588 kg)    Physical Exam  Constitutional: She is oriented to person, place, and time and well-developed, well-nourished, and in no distress. Boot on right lower extremity. HENT:  Head: Normocephalic and atraumatic.  Nose: Nose normal.  Mouth/Throat: Oropharynx is clear and moist.  Eyes: Conjunctivae and EOM are normal. Pupils are equal, round, and reactive to light.  Neck: Normal range of motion. Neck supple.  Cardiovascular: Normal  rate, regular rhythm and normal heart sounds.   Pulmonary/Chest: Effort normal and breath sounds normal.  Abdominal: Soft. Bowel sounds are normal. no HSM Musculoskeletal: Normal range of motion.  Neurological: She is alert and oriented to person, place, and time.  CN II-XII grossly intact, gait is normal (pt is wearing a orthopedic boot) Skin: Skin is warm and dry.      LABORATORY DATA:  I have reviewed the data as listed CBC    Component Value Date/Time   WBC 6.3 02/29/2016 0925   RBC 4.07 02/29/2016 0925   HGB 11.6* 02/29/2016 0925   HCT 35.3* 02/29/2016 0925   PLT 175 02/29/2016 0925   MCV 86.7 02/29/2016 0925   MCH 28.5 02/29/2016 0925   MCHC 32.9 02/29/2016 0925   RDW 13.7 02/29/2016 0925   LYMPHSABS 1.6 02/29/2016 0925   MONOABS 0.5 02/29/2016 0925   EOSABS 0.1 02/29/2016 0925   BASOSABS 0.0 02/29/2016 0925   CMP     Component Value Date/Time   NA 137 02/29/2016 0925   K 3.6 02/29/2016 0925   CL 101 02/29/2016 0925   CO2 28 02/29/2016 0925   GLUCOSE 106* 02/29/2016 0925   BUN 15 02/29/2016 0925   CREATININE 0.79 02/29/2016 0925   CALCIUM 8.9 02/29/2016 0925   PROT 6.7 02/29/2016 0925   ALBUMIN 3.9 02/29/2016 0925   AST 22 02/29/2016 0925   ALT 16 02/29/2016 0925   ALKPHOS 33* 02/29/2016 0925   BILITOT 0.5  02/29/2016 0925   GFRNONAA >60 02/29/2016 0925   GFRAA >60 02/29/2016 0925   ASSESSMENT & PLAN:  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 She is tolerating her tamoxifen well and will continue it daily.  She is up to date on her well care and mammogram is scheduled in July.   She does not need any refills today.  I personally reviewed and went over laboratory studies with the patient.  The patient continues to have issue managing her hypertension. She is seeing her PCP in regards to this on a regular basis  Patient requesting additional laboratory testing for cholesterol and TSH. We have added this onto her blood work today.  The patient notes 6 lbs weight loss with no change in appetite. I advised her to contact us if this weight loss continues.   She will return in 7 months for routine follow up. This is per her request. She notes that this will place her follow-up next year with me after her mammogram as she would prefer this.  At this next visit, I will perform a breast exam.   She has a chronic mild anemia, I have added B12, folate and iron studies to next follow-up.  All questions were answered. The patient knows to call the clinic with any problems, questions or concerns.  This note was electronically signed.    This document serves as a record of services personally performed by Ancil Linsey, MD. It was created on her behalf by Arlyce Harman, a trained medical scribe. The creation of this record is based on the scribe's personal observations and the provider's statements to them. This document has been checked and approved by the attending provider.  I have reviewed the above documentation for accuracy and completeness, and I agree with the above.  Molli Hazard, MD

## 2016-03-14 ENCOUNTER — Other Ambulatory Visit (HOSPITAL_COMMUNITY): Payer: Self-pay | Admitting: Hematology & Oncology

## 2016-03-14 DIAGNOSIS — D0591 Unspecified type of carcinoma in situ of right breast: Secondary | ICD-10-CM

## 2016-04-11 ENCOUNTER — Encounter (HOSPITAL_COMMUNITY): Payer: Medicare Other

## 2016-04-12 ENCOUNTER — Other Ambulatory Visit (HOSPITAL_COMMUNITY): Payer: Self-pay | Admitting: Hematology & Oncology

## 2016-04-12 DIAGNOSIS — Z09 Encounter for follow-up examination after completed treatment for conditions other than malignant neoplasm: Secondary | ICD-10-CM

## 2016-04-12 DIAGNOSIS — Z9889 Other specified postprocedural states: Secondary | ICD-10-CM

## 2016-04-18 ENCOUNTER — Ambulatory Visit (HOSPITAL_COMMUNITY)
Admission: RE | Admit: 2016-04-18 | Discharge: 2016-04-18 | Disposition: A | Discharge status: Home / Self Care | Payer: Medicare Other | Source: Ambulatory Visit | Attending: Hematology & Oncology | Admitting: Hematology & Oncology

## 2016-04-18 DIAGNOSIS — D0511 Intraductal carcinoma in situ of right breast: Secondary | ICD-10-CM | POA: Diagnosis present

## 2016-05-24 ENCOUNTER — Other Ambulatory Visit (HOSPITAL_COMMUNITY): Payer: Self-pay | Admitting: Oncology

## 2016-10-03 ENCOUNTER — Encounter (HOSPITAL_COMMUNITY): Payer: Self-pay | Admitting: Hematology & Oncology

## 2016-10-03 ENCOUNTER — Encounter (HOSPITAL_COMMUNITY): Payer: Medicare Other | Attending: Hematology & Oncology | Admitting: Hematology & Oncology

## 2016-10-03 ENCOUNTER — Encounter (HOSPITAL_COMMUNITY): Payer: Medicare Other

## 2016-10-03 VITALS — BP 159/63 | HR 81 | Temp 97.6°F | Resp 14 | Wt 151.2 lb

## 2016-10-03 DIAGNOSIS — Z801 Family history of malignant neoplasm of trachea, bronchus and lung: Secondary | ICD-10-CM | POA: Insufficient documentation

## 2016-10-03 DIAGNOSIS — Z923 Personal history of irradiation: Secondary | ICD-10-CM | POA: Diagnosis not present

## 2016-10-03 DIAGNOSIS — D649 Anemia, unspecified: Secondary | ICD-10-CM

## 2016-10-03 DIAGNOSIS — Z888 Allergy status to other drugs, medicaments and biological substances status: Secondary | ICD-10-CM | POA: Insufficient documentation

## 2016-10-03 DIAGNOSIS — D0511 Intraductal carcinoma in situ of right breast: Secondary | ICD-10-CM

## 2016-10-03 DIAGNOSIS — Z8052 Family history of malignant neoplasm of bladder: Secondary | ICD-10-CM | POA: Diagnosis not present

## 2016-10-03 DIAGNOSIS — Z880 Allergy status to penicillin: Secondary | ICD-10-CM | POA: Insufficient documentation

## 2016-10-03 DIAGNOSIS — Z823 Family history of stroke: Secondary | ICD-10-CM | POA: Diagnosis not present

## 2016-10-03 DIAGNOSIS — Z0000 Encounter for general adult medical examination without abnormal findings: Secondary | ICD-10-CM

## 2016-10-03 DIAGNOSIS — Z8051 Family history of malignant neoplasm of kidney: Secondary | ICD-10-CM | POA: Insufficient documentation

## 2016-10-03 DIAGNOSIS — Z91013 Allergy to seafood: Secondary | ICD-10-CM | POA: Diagnosis not present

## 2016-10-03 DIAGNOSIS — E785 Hyperlipidemia, unspecified: Secondary | ICD-10-CM | POA: Diagnosis not present

## 2016-10-03 DIAGNOSIS — E039 Hypothyroidism, unspecified: Secondary | ICD-10-CM | POA: Insufficient documentation

## 2016-10-03 DIAGNOSIS — Z7982 Long term (current) use of aspirin: Secondary | ICD-10-CM | POA: Diagnosis not present

## 2016-10-03 DIAGNOSIS — Z79810 Long term (current) use of selective estrogen receptor modulators (SERMs): Secondary | ICD-10-CM | POA: Insufficient documentation

## 2016-10-03 DIAGNOSIS — Z85850 Personal history of malignant neoplasm of thyroid: Secondary | ICD-10-CM | POA: Diagnosis not present

## 2016-10-03 DIAGNOSIS — Z79899 Other long term (current) drug therapy: Secondary | ICD-10-CM | POA: Diagnosis not present

## 2016-10-03 DIAGNOSIS — Z8042 Family history of malignant neoplasm of prostate: Secondary | ICD-10-CM | POA: Insufficient documentation

## 2016-10-03 DIAGNOSIS — K589 Irritable bowel syndrome without diarrhea: Secondary | ICD-10-CM | POA: Insufficient documentation

## 2016-10-03 DIAGNOSIS — G8929 Other chronic pain: Secondary | ICD-10-CM | POA: Insufficient documentation

## 2016-10-03 DIAGNOSIS — M549 Dorsalgia, unspecified: Secondary | ICD-10-CM | POA: Insufficient documentation

## 2016-10-03 DIAGNOSIS — Z5181 Encounter for therapeutic drug level monitoring: Secondary | ICD-10-CM

## 2016-10-03 DIAGNOSIS — K219 Gastro-esophageal reflux disease without esophagitis: Secondary | ICD-10-CM | POA: Insufficient documentation

## 2016-10-03 DIAGNOSIS — I10 Essential (primary) hypertension: Secondary | ICD-10-CM | POA: Insufficient documentation

## 2016-10-03 DIAGNOSIS — F419 Anxiety disorder, unspecified: Secondary | ICD-10-CM | POA: Insufficient documentation

## 2016-10-03 LAB — COMPREHENSIVE METABOLIC PANEL
ALBUMIN: 3.8 g/dL (ref 3.5–5.0)
ALK PHOS: 36 U/L — AB (ref 38–126)
ALT: 20 U/L (ref 14–54)
AST: 24 U/L (ref 15–41)
Anion gap: 9 (ref 5–15)
BUN: 15 mg/dL (ref 6–20)
CHLORIDE: 101 mmol/L (ref 101–111)
CO2: 27 mmol/L (ref 22–32)
Calcium: 8.5 mg/dL — ABNORMAL LOW (ref 8.9–10.3)
Creatinine, Ser: 0.76 mg/dL (ref 0.44–1.00)
GFR calc non Af Amer: >60 mL/min (ref >60)
GLUCOSE: 105 mg/dL — AB (ref 65–99)
POTASSIUM: 3.4 mmol/L — AB (ref 3.5–5.1)
SODIUM: 137 mmol/L (ref 135–145)
Total Bilirubin: 0.5 mg/dL (ref 0.3–1.2)
Total Protein: 6.7 g/dL (ref 6.5–8.1)

## 2016-10-03 LAB — CBC WITH DIFFERENTIAL/PLATELET
BASOS PCT: 0 %
Basophils Absolute: 0 10*3/uL (ref 0.0–0.1)
EOS ABS: 0.1 10*3/uL (ref 0.0–0.7)
EOS PCT: 2 %
HCT: 33.9 % — ABNORMAL LOW (ref 36.0–46.0)
HEMOGLOBIN: 11.4 g/dL — AB (ref 12.0–15.0)
Lymphocytes Relative: 29 %
Lymphs Abs: 1.7 10*3/uL (ref 0.7–4.0)
MCH: 29.8 pg (ref 26.0–34.0)
MCHC: 33.6 g/dL (ref 30.0–36.0)
MCV: 88.5 fL (ref 78.0–100.0)
MONOS PCT: 7 %
Monocytes Absolute: 0.4 10*3/uL (ref 0.1–1.0)
Neutro Abs: 3.7 10*3/uL (ref 1.7–7.7)
Neutrophils Relative %: 62 %
PLATELETS: 163 10*3/uL (ref 150–400)
RBC: 3.83 MIL/uL — ABNORMAL LOW (ref 3.87–5.11)
RDW: 13.3 % (ref 11.5–15.5)
WBC: 5.9 10*3/uL (ref 4.0–10.5)

## 2016-10-03 LAB — IRON AND TIBC
IRON: 75 ug/dL (ref 28–170)
Saturation Ratios: 17 % (ref 10.4–31.8)
TIBC: 445 ug/dL (ref 250–450)
UIBC: 370 ug/dL

## 2016-10-03 LAB — FERRITIN: FERRITIN: 12 ng/mL (ref 11–307)

## 2016-10-03 LAB — VITAMIN B12: Vitamin B-12: 200 pg/mL (ref 180–914)

## 2016-10-03 LAB — FOLATE: FOLATE: 20.3 ng/mL (ref >5.9)

## 2016-10-03 NOTE — Patient Instructions (Addendum)
Angelica Grimes at [facility] Discharge Instructions  RECOMMENDATIONS MADE BY THE CONSULTANT AND ANY TEST RESULTS WILL BE SENT TO YOUR REFERRING PHYSICIAN.  You were seen today by Dr. Whitney Muse. Diagnostic mammogram in July. Return in 6 months for follow up and labs.   Thank you for choosing [place] at [facility] to provide your oncology and hematology care.  To afford each patient quality time with our provider, please arrive at least 15 minutes before your scheduled appointment time.    If you have a lab appointment with the [place] please come in thru the  Main Entrance and check in at the main information desk  You need to re-schedule your appointment should you arrive 10 or more minutes late.  We strive to give you quality time with our providers, and arriving late affects you and other patients whose appointments are after yours.  Also, if you no show three or more times for appointments you may be dismissed from the clinic at the providers discretion.     Again, thank you for choosing [facility].  Our hope is that these requests will decrease the amount of time that you wait before being seen by our physicians.       _____________________________________________________________  Should you have questions after your visit to [facility], please contact our office at [phone] between the hours of 8:30 a.m. and 4:30 p.m.  Voicemails left after 4:30 p.m. will not be returned until the following business day.  For prescription refill requests, have your pharmacy contact our office.       Resources For Cancer Patients and their Caregivers ? American Cancer Society: Can assist with transportation, wigs, general needs, runs Look Good Feel Better.        313-699-8743 ? Cancer Care: Provides financial assistance, online support groups, medication/co-pay assistance.  1-800-813-HOPE (478)228-8378) ? [place] Assists [place] cancer patients and their families through emotional , educational and financial support.  (804)385-4905 ? [place] DSS Where to apply for food stamps, Medicaid and utility assistance. 6301428566 ? RCATS: Transportation to medical appointments. (609)353-2731 ? Social Security Administration: May apply for disability if have a Stage IV cancer. (478)581-2039 (640)742-7984 ? [employer], Disability and Transit Services: Assists with nutrition, care and transit needs. [place] Support Programs: @10RELATIVEDAYS @ > Cancer Support Group  2nd Tuesday of the month 1pm-2pm, Journey Room  > Creative Journey  3rd Tuesday of the month 1130am-1pm, Journey Room  > Look Good Feel Better  1st Wednesday of the month 10am-12 noon, Journey Room (Call [place] to register [phone])

## 2016-10-03 NOTE — Progress Notes (Signed)
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 female is here because of DCIS of the right breast, this is her 6 month follow-up.   Angelica Grimes is unaccompanied. I personally reviewed and went over laboratory studies with the patient.  She was advised to see gynecology yearly, though her insurance only covers these visits every two years. The patient is concerned about this. She sees Dr. Derenda Mis in [place].   The patient kept track of her blood pressure at home for two months as advised by Dr. Carmie End office. These readings have been normal, however the patient noticed today her blood pressure was elevated when taken in our office. She doesn't understand why this happens as she notes that she feels good about being here, no anxiety that she notices.   She feels pretty good overall. She denies problems with her breasts or anything she is worried about. She denies abdominal pain or vaginal bleeding.  No hot flashes. She is active. Appetite is good.  Last mammogram was in July.  MEDICAL HISTORY:  Past Medical History:  Diagnosis Date  . Anxiety   . Cancer ([place])    breast  . Cancer ([place])    thyroid  . Fracture ankle fracture 01/24/16  . GERD (gastroesophageal reflux disease)   . Hyperlipidemia   . Hypertension   . Hypothyroidism   . IBS (irritable bowel syndrome)     SURGICAL HISTORY: Past Surgical History:  Procedure Laterality Date  . breast cyst surgery      x 2 -  benign   . BREAST LUMPECTOMY     right breast with re-excision  . [place]   biopsy  . BUNIONECTOMY  2012   right foot  . COLONOSCOPY    . CYSTECTOMY  1983  . THYROIDECTOMY  1991   due to cancer  . TONSILLECTOMY      SOCIAL HISTORY: Social History   Social History  . Marital status: Widowed    Spouse name: N/A  . Number of children: N/A  . Years of education: N/A   Occupational History  . Not on file.   Social History Main Topics  . Smoking status: Never Smoker  . Smokeless tobacco: Never Used  . Alcohol use Yes     Comment: rare  . Drug use: No  . Sexual activity: Not on file   Other Topics Concern  . Not on file   Social History Narrative  . No narrative on file    FAMILY HISTORY: Family History  Problem Relation Age of Onset  . Stroke Mother   . Pneumonia Mother   . Cancer Father     bladder, prostate  . Cancer Sister     kidney  . Cancer Brother     lung  . Cancer Sister     kidney  . Cancer Sister     lung   indicated that her mother is deceased. She indicated that her father is deceased. She indicated that all of her  three sisters are alive. She indicated that her brother is deceased.     ALLERGIES:  is allergic to shrimp [shellfish allergy]; aspirin; benazepril; penicillins; clonidine derivatives; and orange fruit [citrus].  MEDICATIONS:  Current Outpatient Prescriptions  Medication Sig Dispense Refill  . ALPRAZolam (XANAX) 0.25 MG tablet Take 0.25 mg by mouth as needed. anxiety    . aspirin EC 81 MG tablet Take 81 mg by mouth daily.    Marland Kitchen CALCIUM-MAGNESIUM-ZINC PO Take 2 tablets by mouth daily.    Marland Kitchen co-enzyme Q-10 50 MG capsule Take 100 mg by mouth daily.    . CRESTOR 10 MG tablet 10 mg daily.     . Glucosamine-Chondroitin (OSTEO BI-FLEX REGULAR STRENGTH PO) Take 2 tablets by mouth daily.    . hydrALAZINE (APRESOLINE) 25 MG tablet Take 25 mg by mouth 3 (three) times daily.     . hydrochlorothiazide (HYDRODIURIL) 25  MG tablet Take 25 mg by mouth daily with breakfast.     . lisinopril (PRINIVIL,ZESTRIL) 10 MG tablet     . Multiple Minerals-Vitamins (CALCIUM-MAGNESIUM-ZINC-D3 PO) Take 1 tablet by mouth daily. Reported on 02/29/2016    . omeprazole (PRILOSEC) 20 MG capsule Take 20 mg by mouth daily.     . potassium chloride SA (K-DUR,KLOR-CON) 20 MEQ tablet Take 20 mEq by mouth daily.     Marland Kitchen SYNTHROID 75 MCG tablet Take 88 mcg by mouth daily with breakfast.     . tamoxifen (NOLVADEX) 20 MG tablet TAKE 1 TABLET BY MOUTH EVERY DAY 90 tablet 1   No current facility-administered medications for this visit.     Review of Systems  Constitutional: Negative.   HENT: Negative.   Eyes: Negative.   Respiratory: Negative.   Cardiovascular: Negative.   Gastrointestinal: Negative.   Genitourinary: Negative.   Musculoskeletal: Negative.   Skin: Negative.   Neurological: Negative.   Endo/Heme/Allergies: Negative.   Psychiatric/Behavioral: Negative.   All other systems reviewed and are negative. 14 point ROS was done and is otherwise as detailed above or in HPI   PHYSICAL EXAMINATION:  ECOG PERFORMANCE STATUS: 0 - Asymptomatic  Vitals:   10/03/16 1233  BP: (!) 159/63  Pulse: 81  Resp: 14  Temp: 97.6 F (36.4 C)   Filed Weights   10/03/16 1233  Weight: 151 lb 3.2 oz (68.6 kg)    Physical Exam  Constitutional: She is oriented to person, place, and time and well-developed, well-nourished, and in no distress.  HENT:  Head: Normocephalic and atraumatic.  Mouth/Throat: Oropharynx is clear and moist.  Eyes: Conjunctivae and EOM are normal. Pupils are equal, round, and reactive to light.  Neck: Normal range of motion. Neck supple.  Cardiovascular: Normal rate, regular rhythm and normal heart sounds.   Pulmonary/Chest: Effort normal and breath sounds normal.    Abdominal: Soft. Bowel sounds are normal.  Musculoskeletal: Normal range of motion.  Neurological: She is alert and oriented to person, place,  and time. Gait normal.  Skin: Skin is warm and dry.  Nursing note and vitals reviewed.   LABORATORY DATA:  I have reviewed the data as listed CBC    Component Value Date/Time   WBC 5.9 10/03/2016 1046   RBC 3.83 (L) 10/03/2016 1046   HGB 11.4 (L) 10/03/2016 1046   HCT 33.9 (L) 10/03/2016 1046   PLT 163 10/03/2016 1046   MCV 88.5 10/03/2016 1046   MCH 29.8 10/03/2016 1046   MCHC 33.6 10/03/2016 1046   RDW 13.3 10/03/2016 1046   LYMPHSABS 1.7 10/03/2016  1046   MONOABS 0.4 10/03/2016 1046   EOSABS 0.1 10/03/2016 1046   BASOSABS 0.0 10/03/2016 1046   CMP     Component Value Date/Time   NA 137 10/03/2016 1046   K 3.4 (L) 10/03/2016 1046   CL 101 10/03/2016 1046   CO2 27 10/03/2016 1046   GLUCOSE 105 (H) 10/03/2016 1046   BUN 15 10/03/2016 1046   CREATININE 0.76 10/03/2016 1046   CALCIUM 8.5 (L) 10/03/2016 1046   PROT 6.7 10/03/2016 1046   ALBUMIN 3.8 10/03/2016 1046   AST 24 10/03/2016 1046   ALT 20 10/03/2016 1046   ALKPHOS 36 (L) 10/03/2016 1046   BILITOT 0.5 10/03/2016 1046   GFRNONAA >60 10/03/2016 1046   GFRAA >60 10/03/2016 1046   ASSESSMENT & PLAN:  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 She is tolerating her tamoxifen well and will continue it daily.  She is up to date on her well care and mammogram is scheduled in July.   She does not need any refills today.  I personally reviewed and went over laboratory studies with the patient.  The patient continues to have issue managing her hypertension. She is seeing her PCP in regards to this on a regular basis, she notes that BP measurements at home have been ANL.   Patient requesting additional laboratory testing for cholesterol and TSH. We have added this onto her blood work today.  She has a chronic mild anemia, I have added B12, folate and iron studies today. She will be  notified of the results.   Breast exam performed today.  Last mammogram was 04/18/2016. She is due for a mammogram in July 2018. I have placed an order for this.  She will be completing Tamoxifen the end of this year.  The patient is concerned about the recommended annual gynecology visits since her insurance only covers a gynecology visit every two years. I advised the patient to speak with Dr. Horton Marshall nursing staff about this.  She does not need any refills at this time.   She will return for follow up in 6 months.  Orders Placed This Encounter  Procedures  . MM DIAG BREAST TOMO BILATERAL    JH/MICHELLE   NO SPECIAL NEEDS HX LUMPECTOMY 2013   TOLD MICHELLE THAT US'S NEED TO BE [place] CORRECTLY    Standing Status:   Future    Standing Expiration Date:   12/03/2017    Order Specific Question:   Reason for Exam (SYMPTOM  OR DIAGNOSIS REQUIRED)    Answer:   breast cancer    Order Specific Question:   Preferred imaging location?    Answer:   [facility] BILATERAL    Standing Status:   Future    Standing Expiration Date:   12/01/2017    Order Specific Question:   Reason for Exam (SYMPTOM  OR DIAGNOSIS REQUIRED)    Answer:   ductal carcinoma of right breast    Order Specific Question:   Preferred imaging location?    Answer:   [facility]  . US BREAST LTD UNI LEFT INC AXILLA    Standing Status:   Future    Standing Expiration Date:   12/01/2017    Order Specific Question:   Reason for Exam (SYMPTOM  OR DIAGNOSIS REQUIRED)    Answer:   ductal  carcinoma of right breast    Order Specific Question:   Preferred imaging location?    Answer:   [facility]  . US BREAST LTD UNI RIGHT INC AXILLA    Standing Status:   Future    Standing Expiration Date:   12/01/2017    Order Specific Question:   Reason for Exam (SYMPTOM  OR DIAGNOSIS REQUIRED)    Answer:   ductal carcinoma of right breast    Order Specific Question:   Preferred imaging location?     Answer:   [facility]  . CBC with Differential    Standing Status:   Future    Standing Expiration Date:   04/02/2018  . Comprehensive metabolic panel    Standing Status:   Future    Standing Expiration Date:   04/02/2018  . TSH    Standing Status:   Future    Number of Occurrences:   1    Standing Expiration Date:   10/03/2017    All questions were answered. The patient knows to call the clinic with any problems, questions or concerns.  This note was electronically signed.    This document serves as a record of services personally performed by Ancil Linsey, MD. It was created on her behalf by Arlyce Harman, a trained medical scribe. The creation of this record is based on the scribe's personal observations and the provider's statements to them. This document has been checked and approved by the attending provider.  I have reviewed the above documentation for accuracy and completeness, and I agree with the above.  Molli Hazard, MD

## 2016-10-04 ENCOUNTER — Encounter (HOSPITAL_COMMUNITY): Payer: Self-pay | Admitting: Hematology & Oncology

## 2016-10-04 LAB — TSH: TSH: 0.949 u[IU]/mL (ref 0.350–4.500)

## 2016-11-08 ENCOUNTER — Telehealth (HOSPITAL_COMMUNITY): Payer: Self-pay

## 2016-11-08 DIAGNOSIS — R790 Abnormal level of blood mineral: Secondary | ICD-10-CM | POA: Insufficient documentation

## 2016-11-08 HISTORY — DX: Abnormal level of blood mineral: R79.0

## 2016-11-08 MED ORDER — IRON POLYSACCH CMPLX-B12-FA 150-0.025-1 MG PO CAPS: 1.0000 | ORAL_CAPSULE | Freq: Every day | ORAL | 5 refills | Status: DC

## 2016-11-08 NOTE — Telephone Encounter (Signed)
Notified patient that she needed to take OTC B12 and an iron pill that was sent to her pharmacy daily. Also she would be referred to a GI MD. Lastly, labs would be repeated at next follow up. She verbalized understanding.

## 2016-11-08 NOTE — Telephone Encounter (Signed)
-----   Message from Patrici Ranks, MD sent at 11/08/2016  1:32 AM EST ----- ADvise Angelica Grimes that her labs from her last visit show low B12 and iron. She needs to take OTC B12 daily. And call in Niferex forte once daily. I do not see where she has seen GI so please refer for iron deficiency if she is willing. Repeat iron studies and B12 at follow-up appointment order under Tom.  Dr.P

## 2016-11-13 ENCOUNTER — Encounter: Payer: Self-pay | Admitting: Gastroenterology

## 2016-11-16 ENCOUNTER — Other Ambulatory Visit (HOSPITAL_COMMUNITY): Payer: Self-pay | Admitting: Oncology

## 2016-12-04 ENCOUNTER — Ambulatory Visit: Payer: Medicare Other | Admitting: Nurse Practitioner

## 2017-01-04 ENCOUNTER — Ambulatory Visit: Payer: Medicare Other | Admitting: Nurse Practitioner

## 2017-01-17 ENCOUNTER — Ambulatory Visit: Payer: Medicare Other | Admitting: Nurse Practitioner

## 2017-04-06 ENCOUNTER — Ambulatory Visit (HOSPITAL_COMMUNITY): Payer: Medicare Other | Admitting: Hematology & Oncology

## 2017-04-06 ENCOUNTER — Other Ambulatory Visit (HOSPITAL_COMMUNITY): Payer: Medicare Other

## 2017-04-10 ENCOUNTER — Ambulatory Visit (HOSPITAL_COMMUNITY): Payer: Medicare Other | Admitting: Hematology & Oncology

## 2017-04-10 ENCOUNTER — Other Ambulatory Visit (HOSPITAL_COMMUNITY): Payer: Medicare Other

## 2017-04-24 ENCOUNTER — Ambulatory Visit (HOSPITAL_COMMUNITY)
Admission: RE | Admit: 2017-04-24 | Discharge: 2017-04-24 | Disposition: A | Discharge status: Home / Self Care | Payer: Medicare Other | Source: Ambulatory Visit | Attending: Hematology & Oncology | Admitting: Hematology & Oncology

## 2017-04-24 DIAGNOSIS — D0511 Intraductal carcinoma in situ of right breast: Secondary | ICD-10-CM | POA: Insufficient documentation

## 2017-05-01 ENCOUNTER — Ambulatory Visit (HOSPITAL_COMMUNITY): Payer: Medicare Other

## 2017-05-01 ENCOUNTER — Encounter (HOSPITAL_BASED_OUTPATIENT_CLINIC_OR_DEPARTMENT_OTHER): Payer: Medicare Other | Admitting: Oncology

## 2017-05-01 ENCOUNTER — Other Ambulatory Visit (HOSPITAL_COMMUNITY): Payer: Medicare Other

## 2017-05-01 ENCOUNTER — Encounter (HOSPITAL_COMMUNITY): Payer: Medicare Other | Attending: Oncology

## 2017-05-01 VITALS — BP 149/56 | HR 73 | Temp 98.5°F | Resp 20 | Wt 147.1 lb

## 2017-05-01 DIAGNOSIS — R790 Abnormal level of blood mineral: Secondary | ICD-10-CM

## 2017-05-01 DIAGNOSIS — D649 Anemia, unspecified: Secondary | ICD-10-CM

## 2017-05-01 DIAGNOSIS — D0511 Intraductal carcinoma in situ of right breast: Secondary | ICD-10-CM | POA: Diagnosis present

## 2017-05-01 LAB — IRON AND TIBC
IRON: 61 ug/dL (ref 28–170)
SATURATION RATIOS: 16 % (ref 10.4–31.8)
TIBC: 385 ug/dL (ref 250–450)
UIBC: 324 ug/dL

## 2017-05-01 LAB — VITAMIN B12: VITAMIN B 12: 787 pg/mL (ref 180–914)

## 2017-05-01 NOTE — Progress Notes (Signed)
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 She had a diagnostic bilateral mammogram on 04/24/17 which demonstrated no evidence of malignancy either breast. Lumpectomy changes on the left. BI-RADS 2. She has been tolerating tamoxifen well without any side effects. She states she recently had cataract eye surgery and she seen a lot better now. She has not palpated any breast masses or had any breast changes. She denies any chest pain, shortness of breath, abdominal pain, focal weakness.  MEDICAL HISTORY:  Past Medical History:  Diagnosis Date  . Anxiety   . Cancer ([place])    breast  . Cancer ([place])    thyroid  . Fracture ankle fracture 01/24/16  . GERD (gastroesophageal reflux disease)   . Hyperlipidemia   . Hypertension   . Hypothyroidism   . IBS (irritable bowel syndrome)     SURGICAL HISTORY: Past Surgical History:  Procedure Laterality Date  . breast cyst surgery      x 2 - benign   . BREAST LUMPECTOMY     right breast with re-excision  . [place]   biopsy  . BUNIONECTOMY  2012   right foot  . COLONOSCOPY    . CYSTECTOMY  1983  . THYROIDECTOMY  1991   due to cancer  . TONSILLECTOMY      SOCIAL HISTORY: Social History   Social History  . Marital status: Widowed    Spouse name: N/A  .  Number of children: N/A  . Years of education: N/A   Occupational History  . Not on file.   Social History Main Topics  . Smoking status: Never Smoker  . Smokeless tobacco: Never Used  . Alcohol use Yes     Comment: rare  . Drug use: No  . Sexual activity: Not on file   Other Topics Concern  . Not on file   Social History Narrative  . No narrative on file    FAMILY HISTORY: Family History  Problem Relation Age of Onset  . Stroke Mother   . Pneumonia Mother   . Cancer Father        bladder, prostate  . Cancer Sister        kidney  . Cancer Brother        lung  . Cancer Sister        kidney  . Cancer Sister        lung   indicated that her mother is deceased. She indicated that her father is deceased. She indicated that all of her three sisters are alive. She indicated that her brother is deceased.     ALLERGIES:  is allergic to shrimp [shellfish allergy]; aspirin; benazepril; penicillins; clonidine derivatives; and orange fruit [citrus].  MEDICATIONS:  Current Outpatient Prescriptions  Medication Sig Dispense Refill  . ALPRAZolam (XANAX) 0.25 MG tablet Take 0.25 mg by mouth as needed. anxiety    .  CALCIUM-MAGNESIUM-ZINC PO Take 2 tablets by mouth daily.    Marland Kitchen co-enzyme Q-10 50 MG capsule Take 100 mg by mouth daily.    . CRESTOR 10 MG tablet 10 mg daily.     . Glucosamine-Chondroitin (OSTEO BI-FLEX REGULAR STRENGTH PO) Take 2 tablets by mouth daily.    . hydrALAZINE (APRESOLINE) 25 MG tablet Take 25 mg by mouth 3 (three) times daily.     . hydrochlorothiazide (HYDRODIURIL) 25 MG tablet Take 25 mg by mouth daily with breakfast.     . lisinopril (PRINIVIL,ZESTRIL) 10 MG tablet     . Multiple Minerals-Vitamins (CALCIUM-MAGNESIUM-ZINC-D3 PO) Take 1 tablet by mouth daily. Reported on 02/29/2016    . omeprazole (PRILOSEC) 20 MG capsule Take 20 mg by mouth daily.     . potassium chloride SA (K-DUR,KLOR-CON) 20 MEQ tablet Take 20 mEq by mouth daily.     Marland Kitchen SYNTHROID 75 MCG  tablet Take 88 mcg by mouth daily with breakfast.     . tamoxifen (NOLVADEX) 20 MG tablet TAKE 1 TABLET BY MOUTH EVERY DAY 90 tablet 3   No current facility-administered medications for this visit.     Review of Systems  Constitutional: Negative.   HENT: Negative.   Eyes: Negative.   Respiratory: Negative.   Cardiovascular: Negative.   Gastrointestinal: Negative.   Genitourinary: Negative.   Musculoskeletal: Negative.   Skin: Negative.   Neurological: Negative.   Endo/Heme/Allergies: Negative.   Psychiatric/Behavioral: Negative.   All other systems reviewed and are negative. 14 point ROS was done and is otherwise as detailed above or in HPI   PHYSICAL EXAMINATION:  ECOG PERFORMANCE STATUS: 0 - Asymptomatic  Vitals:   05/01/17 1058  BP: (!) 149/56  Pulse: 73  Resp: 20  Temp: 98.5 F (36.9 C)   Filed Weights   05/01/17 1058  Weight: 147 lb 1.6 oz (66.7 kg)    Physical Exam  Constitutional: She is oriented to person, place, and time and well-developed, well-nourished, and in no distress.  HENT:  Head: Normocephalic and atraumatic.  Mouth/Throat: Oropharynx is clear and moist.  Eyes: Pupils are equal, round, and reactive to light. Conjunctivae and EOM are normal.  Neck: Normal range of motion. Neck supple.  Cardiovascular: Normal rate, regular rhythm and normal heart sounds.   Pulmonary/Chest: Effort normal and breath sounds normal.  Abdominal: Soft. Bowel sounds are normal.  Musculoskeletal: Normal range of motion.  Neurological: She is alert and oriented to person, place, and time. Gait normal.  Skin: Skin is warm and dry.  Nursing note and vitals reviewed.   LABORATORY DATA:  I have reviewed the data as listed CBC    Component Value Date/Time   WBC 5.9 10/03/2016 1046   RBC 3.83 (L) 10/03/2016 1046   HGB 11.4 (L) 10/03/2016 1046   HCT 33.9 (L) 10/03/2016 1046   PLT 163 10/03/2016 1046   MCV 88.5 10/03/2016 1046   MCH 29.8 10/03/2016 1046   MCHC 33.6  10/03/2016 1046   RDW 13.3 10/03/2016 1046   LYMPHSABS 1.7 10/03/2016 1046   MONOABS 0.4 10/03/2016 1046   EOSABS 0.1 10/03/2016 1046   BASOSABS 0.0 10/03/2016 1046   CMP     Component Value Date/Time   NA 137 10/03/2016 1046   K 3.4 (L) 10/03/2016 1046   CL 101 10/03/2016 1046   CO2 27 10/03/2016 1046   GLUCOSE 105 (H) 10/03/2016 1046   BUN 15 10/03/2016 1046   CREATININE 0.76 10/03/2016 1046   CALCIUM 8.5 (L) 10/03/2016  1046   PROT 6.7 10/03/2016 1046   ALBUMIN 3.8 10/03/2016 1046   AST 24 10/03/2016 1046   ALT 20 10/03/2016 1046   ALKPHOS 36 (L) 10/03/2016 1046   BILITOT 0.5 10/03/2016 1046   GFRNONAA >60 10/03/2016 1046   GFRAA >60 10/03/2016 1046   ASSESSMENT & PLAN:  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 She will complete her 5 years of Tamoxifen in December 2018. Reviewed mammogram results with the patient.  Her labs are pending today, we will give her results once they return. RTC in December 2018, will discuss taking her off of tamoxifen at that time.  Orders Placed This Encounter  Procedures  . CBC with Differential    Standing Status:   Future    Standing Expiration Date:   05/01/2018  . Comprehensive metabolic panel    Standing Status:   Future    Standing Expiration Date:   05/01/2018  . Iron and TIBC    Standing Status:   Future    Standing Expiration Date:   05/01/2018  . Ferritin    Standing Status:   Future    Standing Expiration Date:   05/01/2018    All questions were answered. The patient knows to call the clinic with any problems, questions or concerns.  This note was electronically signed.     Twana First, MD

## 2017-05-01 NOTE — Patient Instructions (Signed)
[facility] at [facility] Discharge Instructions  RECOMMENDATIONS MADE BY THE CONSULTANT AND ANY TEST RESULTS WILL BE SENT TO YOUR REFERRING PHYSICIAN.    Thank you for choosing [facility] at [facility] to provide your oncology and hematology care.  To afford each patient quality time with our provider, please arrive at least 15 minutes before your scheduled appointment time.    If you have a lab appointment with the Cancer Center please come in thru the  Main Entrance and check in at the main information desk  You need to re-schedule your appointment should you arrive 10 or more minutes late.  We strive to give you quality time with our providers, and arriving late affects you and other patients whose appointments are after yours.  Also, if you no show three or more times for appointments you may be dismissed from the clinic at the providers discretion.     Again, thank you for choosing [facility].  Our hope is that these requests will decrease the amount of time that you wait before being seen by our physicians.       _____________________________________________________________  Should you have questions after your visit to [facility], please contact our office at [phone] between the hours of 8:30 a.m. and 4:30 p.m.  Voicemails left after 4:30 p.m. will not be returned until the following business day.  For prescription refill requests, have your pharmacy contact our office.       Resources For Cancer Patients and their Caregivers ? American Cancer Society: Can assist with transportation, wigs, general needs, runs Look Good Feel Better.        1-888-227-6333 ? Cancer Care: Provides financial assistance, online support groups, medication/co-pay assistance.  1-800-813-HOPE (4673) ? [facility] Assists [place] cancer patients and their families through emotional , educational  and financial support.  336-427-4357 ? [place] DSS Where to apply for food stamps, Medicaid and utility assistance. 336-342-1394 ? RCATS: Transportation to medical appointments. 336-347-2287 ? Social Security Administration: May apply for disability if have a Stage IV cancer. 336-342-7796 1-800-772-1213 ? [place] Aging, Disability and Transit Services: Assists with nutrition, care and transit needs. 336-349-2343  Cancer Center Support Programs: @10RELATIVEDAYS@ > Cancer Support Group  2nd Tuesday of the month 1pm-2pm, Journey Room  > Creative Journey  3rd Tuesday of the month 1130am-1pm, Journey Room  > Look Good Feel Better  1st Wednesday of the month 10am-12 noon, Journey Room (Call [employer] to register 1-800-395-5775)   

## 2017-10-17 ENCOUNTER — Other Ambulatory Visit (HOSPITAL_COMMUNITY): Payer: Self-pay | Admitting: *Deleted

## 2017-10-17 DIAGNOSIS — D0511 Intraductal carcinoma in situ of right breast: Secondary | ICD-10-CM

## 2017-10-18 ENCOUNTER — Inpatient Hospital Stay (HOSPITAL_BASED_OUTPATIENT_CLINIC_OR_DEPARTMENT_OTHER): Payer: Medicare Other | Admitting: Internal Medicine

## 2017-10-18 ENCOUNTER — Encounter (HOSPITAL_COMMUNITY): Payer: Self-pay | Admitting: Internal Medicine

## 2017-10-18 ENCOUNTER — Other Ambulatory Visit: Payer: Self-pay

## 2017-10-18 ENCOUNTER — Inpatient Hospital Stay (HOSPITAL_COMMUNITY): Payer: Medicare Other | Attending: Oncology

## 2017-10-18 VITALS — BP 150/62 | HR 59 | Temp 98.1°F | Resp 18 | Ht 64.5 in | Wt 154.0 lb

## 2017-10-18 DIAGNOSIS — E785 Hyperlipidemia, unspecified: Secondary | ICD-10-CM | POA: Diagnosis not present

## 2017-10-18 DIAGNOSIS — Z85850 Personal history of malignant neoplasm of thyroid: Secondary | ICD-10-CM | POA: Diagnosis not present

## 2017-10-18 DIAGNOSIS — M545 Low back pain: Secondary | ICD-10-CM

## 2017-10-18 DIAGNOSIS — F419 Anxiety disorder, unspecified: Secondary | ICD-10-CM | POA: Insufficient documentation

## 2017-10-18 DIAGNOSIS — Z8051 Family history of malignant neoplasm of kidney: Secondary | ICD-10-CM | POA: Diagnosis not present

## 2017-10-18 DIAGNOSIS — Z8052 Family history of malignant neoplasm of bladder: Secondary | ICD-10-CM | POA: Insufficient documentation

## 2017-10-18 DIAGNOSIS — D649 Anemia, unspecified: Secondary | ICD-10-CM | POA: Insufficient documentation

## 2017-10-18 DIAGNOSIS — I10 Essential (primary) hypertension: Secondary | ICD-10-CM

## 2017-10-18 DIAGNOSIS — E039 Hypothyroidism, unspecified: Secondary | ICD-10-CM

## 2017-10-18 DIAGNOSIS — G8929 Other chronic pain: Secondary | ICD-10-CM

## 2017-10-18 DIAGNOSIS — Z79810 Long term (current) use of selective estrogen receptor modulators (SERMs): Secondary | ICD-10-CM | POA: Diagnosis not present

## 2017-10-18 DIAGNOSIS — Z79899 Other long term (current) drug therapy: Secondary | ICD-10-CM | POA: Diagnosis not present

## 2017-10-18 DIAGNOSIS — K589 Irritable bowel syndrome without diarrhea: Secondary | ICD-10-CM | POA: Insufficient documentation

## 2017-10-18 DIAGNOSIS — Z9049 Acquired absence of other specified parts of digestive tract: Secondary | ICD-10-CM | POA: Diagnosis not present

## 2017-10-18 DIAGNOSIS — D0511 Intraductal carcinoma in situ of right breast: Secondary | ICD-10-CM

## 2017-10-18 DIAGNOSIS — K219 Gastro-esophageal reflux disease without esophagitis: Secondary | ICD-10-CM | POA: Diagnosis not present

## 2017-10-18 DIAGNOSIS — Z801 Family history of malignant neoplasm of trachea, bronchus and lung: Secondary | ICD-10-CM

## 2017-10-18 LAB — COMPREHENSIVE METABOLIC PANEL
ALT: 15 U/L (ref 14–54)
AST: 20 U/L (ref 15–41)
Albumin: 3.5 g/dL (ref 3.5–5.0)
Alkaline Phosphatase: 39 U/L (ref 38–126)
Anion gap: 9 (ref 5–15)
BUN: 14 mg/dL (ref 6–20)
CALCIUM: 8.1 mg/dL — AB (ref 8.9–10.3)
CHLORIDE: 101 mmol/L (ref 101–111)
CO2: 27 mmol/L (ref 22–32)
CREATININE: 0.87 mg/dL (ref 0.44–1.00)
Glucose, Bld: 96 mg/dL (ref 65–99)
Potassium: 3.3 mmol/L — ABNORMAL LOW (ref 3.5–5.1)
Sodium: 137 mmol/L (ref 135–145)
Total Bilirubin: 0.5 mg/dL (ref 0.3–1.2)
Total Protein: 6.5 g/dL (ref 6.5–8.1)

## 2017-10-18 LAB — CBC WITH DIFFERENTIAL/PLATELET
Basophils Absolute: 0 10*3/uL (ref 0.0–0.1)
Basophils Relative: 0 %
EOS PCT: 1 %
Eosinophils Absolute: 0.1 10*3/uL (ref 0.0–0.7)
HCT: 35.2 % — ABNORMAL LOW (ref 36.0–46.0)
Hemoglobin: 11.2 g/dL — ABNORMAL LOW (ref 12.0–15.0)
LYMPHS ABS: 2 10*3/uL (ref 0.7–4.0)
LYMPHS PCT: 24 %
MCH: 27.7 pg (ref 26.0–34.0)
MCHC: 31.8 g/dL (ref 30.0–36.0)
MCV: 87.1 fL (ref 78.0–100.0)
MONO ABS: 0.8 10*3/uL (ref 0.1–1.0)
Monocytes Relative: 10 %
Neutro Abs: 5.4 10*3/uL (ref 1.7–7.7)
Neutrophils Relative %: 65 %
PLATELETS: 184 10*3/uL (ref 150–400)
RBC: 4.04 MIL/uL (ref 3.87–5.11)
RDW: 13.9 % (ref 11.5–15.5)
WBC: 8.3 10*3/uL (ref 4.0–10.5)

## 2017-10-18 LAB — IRON AND TIBC
IRON: 81 ug/dL (ref 28–170)
Saturation Ratios: 18 % (ref 10.4–31.8)
TIBC: 442 ug/dL (ref 250–450)
UIBC: 361 ug/dL

## 2017-10-18 LAB — FERRITIN: FERRITIN: 14 ng/mL (ref 11–307)

## 2017-10-24 ENCOUNTER — Other Ambulatory Visit (HOSPITAL_COMMUNITY): Payer: Self-pay | Admitting: Internal Medicine

## 2017-10-29 ENCOUNTER — Telehealth (HOSPITAL_COMMUNITY): Payer: Self-pay | Admitting: *Deleted

## 2017-10-29 NOTE — Telephone Encounter (Signed)
Patient returned call to clinic today to go ahead and schedule her injectafer infusions.  She states that she still wants to wait on the colonoscopy and GI referral at this time because of the impending weather.  She will decide and let us know when she comes in this week for her infusion.  Appointments made and patient is aware.

## 2017-10-29 NOTE — Progress Notes (Signed)
[facility] [place] NOTE Chief complaint:  DCIS right breast diagnosed in 2013.   HISTORY OF PRESENTING ILLNESS:  Angelica Grimes 83 y.o. 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  She underwent right lumpectomy 2013 followed by adjuvant XRT and has been on tamoxifen prophylaxis since December 2013. She returns today feeling well having no new complaints.  She has chronic back pain which is stable.  She denies any vaginal bleeding.  No leg swelling no history of DVTs. She tolerates tamoxifen well and is excited that today will be her fifth year of completion.  She has not felt any new lumps in either breast no new bone pains.  Her appetite is normal and has stable weight.  MEDICAL HISTORY:  Past Medical History:  Diagnosis Date  . Anxiety   . Cancer ([place])    breast  . Cancer ([place])    thyroid  . Fracture ankle fracture 01/24/16  . GERD (gastroesophageal reflux disease)   . Hyperlipidemia   . Hypertension   . Hypothyroidism   . IBS (irritable bowel syndrome)     SURGICAL HISTORY: Past Surgical History:  Procedure Laterality Date  . breast cyst surgery      x 2 - benign   . BREAST LUMPECTOMY     right breast with re-excision  . [place]   biopsy  . BUNIONECTOMY  2012   right foot  . COLONOSCOPY    . CYSTECTOMY  1983  . THYROIDECTOMY  1991   due to cancer  . TONSILLECTOMY      SOCIAL HISTORY: Social History   Socioeconomic History  . Marital status: Widowed    Spouse name: Not on file  . Number of children: Not on file  . Years of education: Not on file  . Highest education level: Not on file  Social Needs  . Financial resource strain: Not on file  . Food insecurity - worry: Not on file  . Food insecurity - inability: Not on file  . Transportation needs - medical: Not on file  . Transportation needs - non-medical: Not on file  Occupational History  . Not on file    Tobacco Use  . Smoking status: Never Smoker  . Smokeless tobacco: Never Used  Substance and Sexual Activity  . Alcohol use: Yes    Comment: rare  . Drug use: No  . Sexual activity: Not on file  Other Topics Concern  . Not on file  Social History Narrative  . Not on file    FAMILY HISTORY: Family History  Problem Relation Age of Onset  . Stroke Mother   . Pneumonia Mother   . Cancer Father        bladder, prostate  . Cancer Sister        kidney  . Cancer Brother        lung  . Cancer Sister        kidney  . Cancer Sister        lung   indicated that her mother is deceased. She indicated that her father is deceased. She indicated that all of her three sisters are alive. She indicated that her brother is deceased.    ALLERGIES:  is allergic to shrimp [shellfish allergy]; aspirin; benazepril; penicillins; clonidine derivatives; and orange fruit [citrus].  MEDICATIONS:  Current Outpatient Medications  Medication Sig Dispense Refill  . atenolol (TENORMIN) 25 MG tablet  Take 25 mg by mouth daily.  0  . CALCIUM-MAGNESIUM-ZINC PO Take 2 tablets by mouth daily.    Marland Kitchen co-enzyme Q-10 50 MG capsule Take 100 mg by mouth daily.    . CRESTOR 10 MG tablet 10 mg daily.     . Glucosamine-Chondroitin (OSTEO BI-FLEX REGULAR STRENGTH PO) Take 2 tablets by mouth daily.    . hydrALAZINE (APRESOLINE) 25 MG tablet Take 25 mg by mouth 3 (three) times daily.     . hydrochlorothiazide (HYDRODIURIL) 25 MG tablet Take 25 mg by mouth daily with breakfast.     . omeprazole (PRILOSEC) 20 MG capsule Take 20 mg by mouth daily.     . potassium chloride SA (K-DUR,KLOR-CON) 20 MEQ tablet Take 20 mEq by mouth daily.     Marland Kitchen SYNTHROID 88 MCG tablet Take 88 mcg by mouth daily.  3  . tamoxifen (NOLVADEX) 20 MG tablet TAKE 1 TABLET BY MOUTH EVERY DAY 90 tablet 3  . ALPRAZolam (XANAX) 0.25 MG tablet Take 0.25 mg by mouth as needed. anxiety     No current facility-administered medications for this visit.      Review of Systems  Constitutional: Negative.   HENT: Negative.   Eyes: Negative.   Respiratory: Negative.   Cardiovascular: Negative.   Gastrointestinal: Negative.   Genitourinary: Negative.   Musculoskeletal: Negative.   Skin: Negative.   Neurological: Negative.   Endo/Heme/Allergies: Negative.   Psychiatric/Behavioral: Negative.   All other systems reviewed and are negative. 14 point ROS was done and is otherwise as detailed above or in HPI   PHYSICAL EXAMINATION:  ECOG PERFORMANCE STATUS: 0 - Asymptomatic  Vitals:   10/18/17 1409  BP: (!) 150/62  Pulse: (!) 59  Resp: 18  Temp: 98.1 F (36.7 C)  SpO2: 100%   Filed Weights   10/18/17 1409  Weight: 154 lb (69.9 kg)    Physical Exam  Constitutional: She is oriented to person, place, and time and well-developed, well-nourished, and in no distress.  HENT:  Head: Normocephalic and atraumatic.  Mouth/Throat: Oropharynx is clear and moist.  Eyes: Conjunctivae and EOM are normal. Pupils are equal, round, and reactive to light.  Neck: Normal range of motion. Neck supple.  Cardiovascular: Normal rate, regular rhythm and normal heart sounds.  Pulmonary/Chest: Effort normal and breath sounds normal.  Abdominal: Soft. Bowel sounds are normal.  Musculoskeletal: Normal range of motion.  Neurological: She is alert and oriented to person, place, and time. Gait normal.  Skin: Skin is warm and dry.  Nursing note and vitals reviewed.  Breast exam shows lumpectomy scar within the left breast with no palpable lumps no other skin changes suspicious for recurrence right breast is without any masses no nipple irregularities.  Examination of both axillae shows no lymphadenopathy no cervical lymphadenopathy was felt LABORATORY DATA:  I have reviewed the data as listed CBC    Component Value Date/Time   WBC 8.3 10/18/2017 1327   RBC 4.04 10/18/2017 1327   HGB 11.2 (L) 10/18/2017 1327   HCT 35.2 (L) 10/18/2017 1327   PLT 184  10/18/2017 1327   MCV 87.1 10/18/2017 1327   MCH 27.7 10/18/2017 1327   MCHC 31.8 10/18/2017 1327   RDW 13.9 10/18/2017 1327   LYMPHSABS 2.0 10/18/2017 1327   MONOABS 0.8 10/18/2017 1327   EOSABS 0.1 10/18/2017 1327   BASOSABS 0.0 10/18/2017 1327   CMP     Component Value Date/Time   NA 137 10/18/2017 1327   K 3.3 (L) 10/18/2017  1327   CL 101 10/18/2017 1327   CO2 27 10/18/2017 1327   GLUCOSE 96 10/18/2017 1327   BUN 14 10/18/2017 1327   CREATININE 0.87 10/18/2017 1327   CALCIUM 8.1 (L) 10/18/2017 1327   PROT 6.5 10/18/2017 1327   ALBUMIN 3.5 10/18/2017 1327   AST 20 10/18/2017 1327   ALT 15 10/18/2017 1327   ALKPHOS 39 10/18/2017 1327   BILITOT 0.5 10/18/2017 1327   GFRNONAA >60 10/18/2017 1327   GFRAA >60 10/18/2017 1327   ASSESSMENT & PLAN:   DCIS involving the right breast post lumpectomy 2013 followed by adjuvant radiation to the right breast.  Patient will complete 5 years of tamoxifen as of today.  Her most recent mammogram from July 2018 has been reviewed and shows benign findings.  She is without any evidence of disease recurrence based on my clinical exam.  She will be due for her next mammogram in July 2019.    A bilateral mammogram has been requested for July/2019  She will return to follow-up  following the mammogram.  She was given the option of discharging to the primary care physician but she prefers to continue with as annually.   She knows to contact us for any new problems in the interval particularly if she feels any new lumps within the breast or axilla.  Her blood pressure was noted to be slightly elevated, she was recommended follow-up with her primary care physician. Chronic mild anemia since 2017 hemoglobin remained stable at 11.2.     Orders Placed This Encounter  Procedures  . MM Digital Diagnostic Bilat    Standing Status:   Future    Standing Expiration Date:   12/18/2018    Order Specific Question:   Reason for Exam (SYMPTOM  OR  DIAGNOSIS REQUIRED)    Answer:   h/o dcis rigth breast    Order Specific Question:   Preferred imaging location?    Answer:   [facility]   Addendum: These results came back after the patient's clinic visit.Iron panel showed low normal ferritin level suggestive of iron deficiency.  Patient has not had a colonoscopy or EGD for several years she cannot recall her last one could have been some time in 2013.  She denies any melena or hematochezia or change in bowel habits but I still believe that she would benefit from having a screening colonoscopy and if no source of blood loss noted to have an EGD.Marland Kitchen  We will arrange for a GI consultation.  We will contact the patient with the plan. Creola Corn, MD

## 2017-10-30 ENCOUNTER — Encounter: Payer: Self-pay | Admitting: Gastroenterology

## 2017-10-30 ENCOUNTER — Encounter (HOSPITAL_COMMUNITY): Payer: Medicare Other

## 2017-11-01 ENCOUNTER — Encounter (HOSPITAL_COMMUNITY): Payer: Self-pay

## 2017-11-01 ENCOUNTER — Other Ambulatory Visit: Payer: Self-pay

## 2017-11-01 ENCOUNTER — Inpatient Hospital Stay (HOSPITAL_COMMUNITY): Payer: Medicare Other

## 2017-11-01 VITALS — BP 134/53 | HR 55 | Temp 98.0°F | Resp 16

## 2017-11-01 DIAGNOSIS — D0511 Intraductal carcinoma in situ of right breast: Secondary | ICD-10-CM | POA: Diagnosis not present

## 2017-11-01 DIAGNOSIS — R790 Abnormal level of blood mineral: Secondary | ICD-10-CM

## 2017-11-01 MED ORDER — SODIUM CHLORIDE 0.9 % IV SOLN
750.0000 mg | Freq: Once | INTRAVENOUS | Status: AC
  Administered 2017-11-01: 750 mg via INTRAVENOUS
  Filled 2017-11-01: qty 15

## 2017-11-01 MED ORDER — SODIUM CHLORIDE 0.9 % IV SOLN
INTRAVENOUS | Status: DC
  Administered 2017-11-01: 14:00:00 via INTRAVENOUS

## 2017-11-01 NOTE — Patient Instructions (Signed)
[facility] at [facility] Discharge Instructions  RECOMMENDATIONS MADE BY THE CONSULTANT AND ANY TEST RESULTS WILL BE SENT TO YOUR REFERRING PHYSICIAN.  injectafer given Follow up as scheduled.  Thank you for choosing [facility] at [facility] to provide your oncology and hematology care.  To afford each patient quality time with our provider, please arrive at least 15 minutes before your scheduled appointment time.    If you have a lab appointment with the Cancer Center please come in thru the  Main Entrance and check in at the main information desk  You need to re-schedule your appointment should you arrive 10 or more minutes late.  We strive to give you quality time with our providers, and arriving late affects you and other patients whose appointments are after yours.  Also, if you no show three or more times for appointments you may be dismissed from the clinic at the providers discretion.     Again, thank you for choosing [facility].  Our hope is that these requests will decrease the amount of time that you wait before being seen by our physicians.       _____________________________________________________________  Should you have questions after your visit to [facility], please contact our office at [phone] between the hours of 8:30 a.m. and 4:30 p.m.  Voicemails left after 4:30 p.m. will not be returned until the following business day.  For prescription refill requests, have your pharmacy contact our office.       Resources For Cancer Patients and their Caregivers ? American Cancer Society: Can assist with transportation, wigs, general needs, runs Look Good Feel Better.        1-888-227-6333 ? Cancer Care: Provides financial assistance, online support groups, medication/co-pay assistance.  1-800-813-HOPE (4673) ? [facility] Assists [place] cancer patients and their  families through emotional , educational and financial support.  336-427-4357 ? [place] DSS Where to apply for food stamps, Medicaid and utility assistance. 336-342-1394 ? [facility]: Transportation to medical appointments. 336-347-2287 ? Social Security Administration: May apply for disability if have a Stage IV cancer. 336-342-7796 1-800-772-1213 ? [place] Aging, Disability and Transit Services: Assists with nutrition, care and transit needs. 336-349-2343  Cancer Center Support Programs: @[email or web address]@ > Cancer Support Group  2nd Tuesday of the month 1pm-2pm, Journey Room  > Creative Journey  3rd Tuesday of the month 1130am-1pm, Journey Room  > Look Good Feel Better  1st Wednesday of the month 10am-12 noon, Journey Room (Call American Cancer Society to register 1-800-395-5775)   

## 2017-11-01 NOTE — Progress Notes (Signed)
Treatment given per orders. Patient tolerated it well without problems. Vitals stable and discharged home from clinic ambulatory. Follow up as scheduled.  

## 2017-11-10 ENCOUNTER — Other Ambulatory Visit (HOSPITAL_COMMUNITY): Payer: Self-pay | Admitting: Oncology

## 2017-12-06 ENCOUNTER — Ambulatory Visit: Payer: Medicare Other | Admitting: Gastroenterology

## 2018-02-07 ENCOUNTER — Other Ambulatory Visit (HOSPITAL_COMMUNITY): Payer: Self-pay | Admitting: Internal Medicine

## 2018-02-07 DIAGNOSIS — Z1231 Encounter for screening mammogram for malignant neoplasm of breast: Secondary | ICD-10-CM

## 2018-02-08 ENCOUNTER — Encounter: Payer: Self-pay | Admitting: Nurse Practitioner

## 2018-04-30 ENCOUNTER — Encounter (HOSPITAL_COMMUNITY): Payer: Medicare Other

## 2018-05-01 ENCOUNTER — Encounter: Payer: Self-pay | Admitting: Nurse Practitioner

## 2018-05-01 ENCOUNTER — Ambulatory Visit (INDEPENDENT_AMBULATORY_CARE_PROVIDER_SITE_OTHER): Payer: Medicare Other | Admitting: Nurse Practitioner

## 2018-05-01 DIAGNOSIS — R195 Other fecal abnormalities: Secondary | ICD-10-CM

## 2018-05-01 HISTORY — DX: Other fecal abnormalities: R19.5

## 2018-05-01 NOTE — Progress Notes (Signed)
Primary Care Physician:  Octavio Graves, DO Primary Gastroenterologist:  Dr. Oneida Alar  Chief Complaint  Patient presents with  . Colonoscopy    had +cologuard, last tcs approx 10 yrs ago at [place]    HPI:   Angelica Grimes is a 83 y.o. female who presents on referral from primary care to schedule colonoscopy due to positive Cologuard testing.  Reviewed information provided with the referral including office visit dated 01/01/2018 which is a 15-month follow-up.  No information related to Cologuard at that time.  Reviewed labs provided including CBC which is normal hemoglobin at 12.6, normal kidney function with creatinine 0.7.  Normal LFTs..  No history of colonoscopy in our system.  Today she states she's doing ok overall.  Her last colonoscopy was approximately 10 years ago at [facility].  He recently had a Cologuard test which came back positive.  Blood pressures mildly elevated today but she was recently taken off amlodipine for concerns of lower extremity edema.  She has a follow-up appointment scheduled with primary care next week. She states she's pretty opposed to a colonoscopy at her age. Denies abdominal pain, N/V, hematochezia, melena, fever, chills, unintentional weight loss, acute changes in bowel movements. Denies chest pain, dyspnea, dizziness, lightheadedness, syncope, near syncope. Denies any other upper or lower GI symptoms.  Rarely uses Xanax, only needs a refill once a year.  Past Medical History:  Diagnosis Date  . Anxiety   . Cancer ([place])    breast  . Cancer ([place])    thyroid  . Fracture ankle fracture 01/24/16  . GERD (gastroesophageal reflux disease)   . Hyperlipidemia   . Hypertension   . Hypothyroidism   . IBS (irritable bowel syndrome)     Past Surgical History:  Procedure Laterality Date  . breast cyst surgery      x 2 - benign   . BREAST LUMPECTOMY     right breast with re-excision  . [place]   biopsy  .  BUNIONECTOMY  2012   right foot  . COLONOSCOPY    . CYSTECTOMY  1983  . THYROIDECTOMY  1991   due to cancer  . TONSILLECTOMY      Current Outpatient Medications  Medication Sig Dispense Refill  . ALPRAZolam (XANAX) 0.25 MG tablet Take 0.25 mg by mouth as needed. anxiety    . CALCIUM-MAGNESIUM-ZINC PO Take 2 tablets by mouth daily.    . carvedilol (COREG) 25 MG tablet Take 25 mg by mouth 2 (two) times daily. as directed  4  . co-enzyme Q-10 50 MG capsule Take 100 mg by mouth daily.    . CRESTOR 10 MG tablet 10 mg daily.     . Glucosamine-Chondroitin (OSTEO BI-FLEX REGULAR STRENGTH PO) Take 2 tablets by mouth daily.    . hydrochlorothiazide (HYDRODIURIL) 25 MG tablet Take 25 mg by mouth daily with breakfast.     . omeprazole (PRILOSEC) 20 MG capsule Take 20 mg by mouth daily.     . potassium chloride SA (K-DUR,KLOR-CON) 20 MEQ tablet Take 20 mEq by mouth daily.     Marland Kitchen SYNTHROID 88 MCG tablet Take 88 mcg by mouth daily.  3  . hydrALAZINE (APRESOLINE) 25 MG tablet Take 25 mg by mouth 3 (three) times daily.      No current facility-administered medications for this visit.     Allergies as of 05/01/2018 - Review Complete 05/01/2018  Allergen Reaction Noted  . Shrimp [shellfish allergy] Hives 10/10/2013  .  Aspirin Nausea Only and Other (See Comments) 04/08/2012  . Benazepril Swelling 04/08/2012  . Penicillins Hives, Itching, and Rash 04/08/2012  . Clonidine derivatives Other (See Comments) 04/08/2012  . Orange fruit [citrus] Rash 06/12/2012    Family History  Problem Relation Age of Onset  . Stroke Mother   . Pneumonia Mother   . Cancer Father        bladder, prostate  . Cancer Sister        kidney  . Cancer Brother        lung  . Cancer Sister        kidney  . Cancer Sister        lung    Social History   Socioeconomic History  . Marital status: Widowed    Spouse name: Not on file  . Number of children: Not on file  . Years of education: Not on file  . Highest  education level: Not on file  Occupational History  . Not on file  Social Needs  . Financial resource strain: Not on file  . Food insecurity:    Worry: Not on file    Inability: Not on file  . Transportation needs:    Medical: Not on file    Non-medical: Not on file  Tobacco Use  . Smoking status: Never Smoker  . Smokeless tobacco: Never Used  Substance and Sexual Activity  . Alcohol use: Yes    Comment: rare  . Drug use: No  . Sexual activity: Not on file  Lifestyle  . Physical activity:    Days per week: Not on file    Minutes per session: Not on file  . Stress: Not on file  Relationships  . Social connections:    Talks on phone: Not on file    Gets together: Not on file    Attends religious service: Not on file    Active member of club or organization: Not on file    Attends meetings of clubs or organizations: Not on file    Relationship status: Not on file  . Intimate partner violence:    Fear of current or ex partner: Not on file    Emotionally abused: Not on file    Physically abused: Not on file    Forced sexual activity: Not on file  Other Topics Concern  . Not on file  Social History Narrative  . Not on file    Review of Systems: Complete ROS negative except as per HPI.   Physical Exam: BP (!) 164/78   Pulse 60   Temp (!) 97.5 F (36.4 C) (Oral)   Ht 5' 4.5" (1.638 m)   Wt 157 lb (71.2 kg)   BMI 26.53 kg/m  General:   Alert and oriented. Pleasant and cooperative. Well-nourished and well-developed.  Head:  Normocephalic and atraumatic. Eyes:  Without icterus, sclera clear and conjunctiva pink.  Ears:  Normal auditory acuity. Cardiovascular:  S1, S2 present without murmurs appreciated. Extremities without clubbing or edema. Respiratory:  Clear to auscultation bilaterally. No wheezes, rales, or rhonchi. No distress.  Gastrointestinal:  +BS, soft, non-tender and non-distended. No HSM noted. No guarding or rebound. No masses appreciated.  Rectal:   Deferred  Musculoskalatal:  Symmetrical without gross deformities. Skin:  Intact without significant lesions or rashes. Neurologic:  Alert and oriented x4;  grossly normal neurologically. Psych:  Alert and cooperative. Normal mood and affect. Heme/Lymph/Immune: No significant cervical adenopathy.    05/01/2018 11:15 AM   Disclaimer: This  note was dictated with voice recognition software. Similar sounding words can inadvertently be transcribed and may not be corrected upon review.

## 2018-05-01 NOTE — Assessment & Plan Note (Signed)
The patient had a positive Cologuard test.  She states she regrets having it done.  Her last colonoscopy was 10 years ago we will request her previous colonoscopy report.  She states her last colonoscopy was normal.  After discussing the risks and benefits she decides to decline colonoscopy at this point.  She feels to be unlikely to pursue cancer treatment if they were to find cancer.  I have discussed following up with [place] if she sees any obvious signs such as rectal bleeding, acute change in bowel movements, unintentional weight loss.  Call us with any questions or concerns.  Follow-up as needed.

## 2018-05-01 NOTE — Patient Instructions (Signed)
1. We will request her previous colonoscopy report from [facility]. 2. In 2 to 4 weeks she can call us and ask for the date of your last colonoscopy. 3. Call us if you have any sudden changes in your bowel movements, see blood in your bowel movements, or have any other GI concerns. 4. Call us if you have any questions.  At [facility] we value your feedback. You may receive a survey about your visit today. Please share your experience as we strive to create trusting relationships with our patients to provide genuine, compassionate, quality care.  It was great to meet you today!  I hope you have a great summer!!

## 2018-05-02 ENCOUNTER — Ambulatory Visit (HOSPITAL_COMMUNITY)
Admission: RE | Admit: 2018-05-02 | Discharge: 2018-05-02 | Disposition: A | Discharge status: Home / Self Care | Payer: Medicare Other | Source: Ambulatory Visit | Attending: Internal Medicine | Admitting: Internal Medicine

## 2018-05-02 DIAGNOSIS — Z1231 Encounter for screening mammogram for malignant neoplasm of breast: Secondary | ICD-10-CM | POA: Diagnosis present

## 2018-05-02 NOTE — Progress Notes (Signed)
cc'ed to pcp °

## 2018-06-18 ENCOUNTER — Encounter (HOSPITAL_COMMUNITY): Payer: Self-pay | Admitting: Internal Medicine

## 2018-06-18 ENCOUNTER — Ambulatory Visit (HOSPITAL_COMMUNITY): Payer: Medicare Other | Admitting: Internal Medicine

## 2018-06-18 ENCOUNTER — Inpatient Hospital Stay (HOSPITAL_COMMUNITY): Payer: Medicare Other | Attending: Internal Medicine | Admitting: Internal Medicine

## 2018-06-18 VITALS — BP 144/61 | HR 68 | Temp 97.5°F | Resp 16 | Wt 158.2 lb

## 2018-06-18 DIAGNOSIS — K219 Gastro-esophageal reflux disease without esophagitis: Secondary | ICD-10-CM

## 2018-06-18 DIAGNOSIS — Z8052 Family history of malignant neoplasm of bladder: Secondary | ICD-10-CM | POA: Insufficient documentation

## 2018-06-18 DIAGNOSIS — C73 Malignant neoplasm of thyroid gland: Secondary | ICD-10-CM | POA: Insufficient documentation

## 2018-06-18 DIAGNOSIS — Z8051 Family history of malignant neoplasm of kidney: Secondary | ICD-10-CM | POA: Insufficient documentation

## 2018-06-18 DIAGNOSIS — Z8042 Family history of malignant neoplasm of prostate: Secondary | ICD-10-CM | POA: Diagnosis not present

## 2018-06-18 DIAGNOSIS — E785 Hyperlipidemia, unspecified: Secondary | ICD-10-CM | POA: Insufficient documentation

## 2018-06-18 DIAGNOSIS — Z801 Family history of malignant neoplasm of trachea, bronchus and lung: Secondary | ICD-10-CM | POA: Diagnosis not present

## 2018-06-18 DIAGNOSIS — Z86000 Personal history of in-situ neoplasm of breast: Secondary | ICD-10-CM | POA: Diagnosis present

## 2018-06-18 DIAGNOSIS — E039 Hypothyroidism, unspecified: Secondary | ICD-10-CM

## 2018-06-18 DIAGNOSIS — F419 Anxiety disorder, unspecified: Secondary | ICD-10-CM | POA: Diagnosis not present

## 2018-06-18 DIAGNOSIS — Z85030 Personal history of malignant carcinoid tumor of large intestine: Secondary | ICD-10-CM | POA: Diagnosis not present

## 2018-06-18 DIAGNOSIS — K589 Irritable bowel syndrome without diarrhea: Secondary | ICD-10-CM | POA: Diagnosis not present

## 2018-06-18 DIAGNOSIS — Z803 Family history of malignant neoplasm of breast: Secondary | ICD-10-CM | POA: Diagnosis not present

## 2018-06-18 DIAGNOSIS — D509 Iron deficiency anemia, unspecified: Secondary | ICD-10-CM | POA: Insufficient documentation

## 2018-06-18 DIAGNOSIS — I10 Essential (primary) hypertension: Secondary | ICD-10-CM | POA: Diagnosis not present

## 2018-06-18 DIAGNOSIS — D0511 Intraductal carcinoma in situ of right breast: Secondary | ICD-10-CM

## 2018-06-18 NOTE — Patient Instructions (Signed)
[facility] at [facility] Discharge Instructions     Thank you for choosing [facility] at [facility] to provide your oncology and hematology care.  To afford each patient quality time with our provider, please arrive at least 15 minutes before your scheduled appointment time.   If you have a lab appointment with the Cancer Center please come in thru the  Main Entrance and check in at the main information desk  You need to re-schedule your appointment should you arrive 10 or more minutes late.  We strive to give you quality time with our providers, and arriving late affects you and other patients whose appointments are after yours.  Also, if you no show three or more times for appointments you may be dismissed from the clinic at the providers discretion.     Again, thank you for choosing [facility].  Our hope is that these requests will decrease the amount of time that you wait before being seen by our physicians.       _____________________________________________________________  Should you have questions after your visit to [facility], please contact our office at [phone] between the hours of 8:00 a.m. and 4:30 p.m.  Voicemails left after 4:00 p.m. will not be returned until the following business day.  For prescription refill requests, have your pharmacy contact our office and allow 72 hours.    Cancer Center Support Programs:   > Cancer Support Group  2nd Tuesday of the month 1pm-2pm, Journey Room    

## 2018-06-18 NOTE — Progress Notes (Signed)
Diagnosis Ductal carcinoma in situ (DCIS) of right breast - Plan: CBC with Differential/Platelet, Comprehensive metabolic panel, Lactate dehydrogenase, Ferritin, MM 3D SCREEN BREAST BILATERAL, CANCELED: MM Digital Screening  Staging Cancer Staging No matching staging information was found for the patient.  Assessment and Plan:  1.  DCIS of the right breast.  She underwent right lumpectomy 2013 followed by adjuvant XRT and has been on tamoxifen prophylaxis since December 2013.  She completed therapy in 10/2017.    3 D screening mammogram done 05/02/2018 reviewed and was negative other than lumpectomy changes in the right breast.  PT is set up for 3D screening mammogram in 05/2019 and will follow-up at that time with results.    2.  + Cologuard test.  PT was seen by GI in 04/2018 and recommended for colonoscopy due to + test.  PT does not desire colonoscopy.  She denies any blood in stool or urine.  I have discussed with her to continue labs and monitoring through GI and PCP as directed.  She will have repeat labs at [facility] on RTC in 05/2019.    3.  HTN.  BP is 144/61.  Follow-up with PCP.    4.  Hypothyroidism/reported Thyroid malignancy.  Follow-up with PCP as directed  She reports she has been referred to Endocrinology for evaluation.    30 minutes spent with more than 50% spent in counseling and coordination of care.     Current Status:  Pt is seen today for follow-up.  She is here to go over recent mammogram.    Problem List Patient Active Problem List   Diagnosis Date Noted  . Positive colorectal cancer screening using Cologuard test [R19.5] 05/01/2018  . Low iron stores [R79.0] 11/08/2016  . Preventative health care [Z00.00] 02/29/2016  . Ductal carcinoma in situ (DCIS) of right breast [D05.11] 09/01/2015    Past Medical History Past Medical History:  Diagnosis Date  . Anxiety   . Cancer ([place])    breast  . Cancer ([place])    thyroid  . Fracture ankle fracture 01/24/16  . GERD  (gastroesophageal reflux disease)   . Hyperlipidemia   . Hypertension   . Hypothyroidism   . IBS (irritable bowel syndrome)     Past Surgical History Past Surgical History:  Procedure Laterality Date  . breast cyst surgery      x 2 - benign   . BREAST LUMPECTOMY     right breast with re-excision  . [place]   biopsy  . BUNIONECTOMY  2012   right foot  . COLONOSCOPY    . CYSTECTOMY  1983  . THYROIDECTOMY  1991   due to cancer  . TONSILLECTOMY      Family History Family History  Problem Relation Age of Onset  . Stroke Mother   . Pneumonia Mother   . Cancer Father        bladder, prostate  . Cancer Sister        kidney  . Cancer Brother        lung  . Cancer Sister        kidney  . Cancer Sister        lung  . Colon cancer Neg Hx      Social History  reports that she has never smoked. She has never used smokeless tobacco. She reports that she drinks alcohol. She reports that she does not use drugs.  Medications  Current Outpatient Medications:  .  ALPRAZolam (  XANAX) 0.25 MG tablet, Take 0.25 mg by mouth as needed. anxiety, Disp: , Rfl:  .  amLODipine (NORVASC) 5 MG tablet, Take 5 mg by mouth daily., Disp: , Rfl:  .  CALCIUM-MAGNESIUM-ZINC PO, Take 2 tablets by mouth daily., Disp: , Rfl:  .  carvedilol (COREG) 25 MG tablet, Take 25 mg by mouth 2 (two) times daily. as directed, Disp: , Rfl: 4 .  co-enzyme Q-10 50 MG capsule, Take 100 mg by mouth daily., Disp: , Rfl:  .  CRESTOR 10 MG tablet, 10 mg daily. , Disp: , Rfl:  .  Glucosamine-Chondroitin (OSTEO BI-FLEX REGULAR STRENGTH PO), Take 2 tablets by mouth daily., Disp: , Rfl:  .  hydrochlorothiazide (HYDRODIURIL) 25 MG tablet, Take 25 mg by mouth daily with breakfast. , Disp: , Rfl:  .  omeprazole (PRILOSEC) 20 MG capsule, Take 20 mg by mouth daily. , Disp: , Rfl:  .  potassium chloride SA (K-DUR,KLOR-CON) 20 MEQ tablet, Take 20 mEq by mouth daily. , Disp: , Rfl:  .  SYNTHROID 88 MCG  tablet, Take 88 mcg by mouth daily., Disp: , Rfl: 3  Allergies Shrimp [shellfish allergy]; Aspirin; Benazepril; Penicillins; Clonidine derivatives; and Orange fruit [citrus]  Review of Systems Review of Systems - Oncology ROS negative   Physical Exam  Vitals Wt Readings from Last 3 Encounters:  06/18/18 158 lb 3.2 oz (71.8 kg)  05/01/18 157 lb (71.2 kg)  10/18/17 154 lb (69.9 kg)   Temp Readings from Last 3 Encounters:  06/18/18 (!) 97.5 F (36.4 C) (Oral)  05/01/18 (!) 97.5 F (36.4 C) (Oral)  11/01/17 98 F (36.7 C) (Oral)   BP Readings from Last 3 Encounters:  06/18/18 (!) 144/61  05/01/18 (!) 164/78  11/01/17 (!) 134/53   Pulse Readings from Last 3 Encounters:  06/18/18 68  05/01/18 60  11/01/17 (!) 55    Constitutional: Well-developed, well-nourished, and in no distress.   HENT: Head: Normocephalic and atraumatic.  Mouth/Throat: No oropharyngeal exudate. Mucosa moist. Eyes: Pupils are equal, round, and reactive to light. Conjunctivae are normal. No scleral icterus.  Neck: Normal range of motion. Neck supple. No JVD present.  Cardiovascular: Normal rate, regular rhythm and normal heart sounds.  Exam reveals no gallop and no friction rub.   No murmur heard. Pulmonary/Chest: Effort normal and breath sounds normal. No respiratory distress. No wheezes.No rales.  Abdominal: Soft. Bowel sounds are normal. No distension. There is no tenderness. There is no guarding.  Musculoskeletal: No edema or tenderness.  Lymphadenopathy: No cervical, axillary or supraclavicular adenopathy.  Neurological: Alert and oriented to person, place, and time. No cranial nerve deficit.  Skin: Skin is warm and dry. No rash noted. No erythema. No pallor.  Psychiatric: Affect and judgment normal.  Breast exam:  Chaperone present.  Lumpectomy changes noted on right breast.  No dominant masses palpable bilaterally.    Labs No visits with results within 3 Day(s) from this visit.  Latest known  visit with results is:  Appointment on 10/18/2017  Component Date Value Ref Range Status  . WBC 10/18/2017 8.3  4.0 - 10.5 K/uL Final  . RBC 10/18/2017 4.04  3.87 - 5.11 MIL/uL Final  . Hemoglobin 10/18/2017 11.2* 12.0 - 15.0 g/dL Final  . HCT 10/18/2017 35.2* 36.0 - 46.0 % Final  . MCV 10/18/2017 87.1  78.0 - 100.0 fL Final  . MCH 10/18/2017 27.7  26.0 - 34.0 pg Final  . MCHC 10/18/2017 31.8  30.0 - 36.0 g/dL Final  .  RDW 10/18/2017 13.9  11.5 - 15.5 % Final  . Platelets 10/18/2017 184  150 - 400 K/uL Final  . Neutrophils Relative % 10/18/2017 65  % Final  . Neutro Abs 10/18/2017 5.4  1.7 - 7.7 K/uL Final  . Lymphocytes Relative 10/18/2017 24  % Final  . Lymphs Abs 10/18/2017 2.0  0.7 - 4.0 K/uL Final  . Monocytes Relative 10/18/2017 10  % Final  . Monocytes Absolute 10/18/2017 0.8  0.1 - 1.0 K/uL Final  . Eosinophils Relative 10/18/2017 1  % Final  . Eosinophils Absolute 10/18/2017 0.1  0.0 - 0.7 K/uL Final  . Basophils Relative 10/18/2017 0  % Final  . Basophils Absolute 10/18/2017 0.0  0.0 - 0.1 K/uL Final  . Sodium 10/18/2017 137  135 - 145 mmol/L Final  . Potassium 10/18/2017 3.3* 3.5 - 5.1 mmol/L Final  . Chloride 10/18/2017 101  101 - 111 mmol/L Final  . CO2 10/18/2017 27  22 - 32 mmol/L Final  . Glucose, Bld 10/18/2017 96  65 - 99 mg/dL Final  . BUN 10/18/2017 14  6 - 20 mg/dL Final  . Creatinine, Ser 10/18/2017 0.87  0.44 - 1.00 mg/dL Final  . Calcium 10/18/2017 8.1* 8.9 - 10.3 mg/dL Final  . Total Protein 10/18/2017 6.5  6.5 - 8.1 g/dL Final  . Albumin 10/18/2017 3.5  3.5 - 5.0 g/dL Final  . AST 10/18/2017 20  15 - 41 U/L Final  . ALT 10/18/2017 15  14 - 54 U/L Final  . Alkaline Phosphatase 10/18/2017 39  38 - 126 U/L Final  . Total Bilirubin 10/18/2017 0.5  0.3 - 1.2 mg/dL Final  . GFR calc non Af Amer 10/18/2017 >60  >60 mL/min Final  . GFR calc Af Amer 10/18/2017 >60  >60 mL/min Final   Comment: (NOTE) The eGFR has been calculated using the CKD EPI equation. This  calculation has not been validated in all clinical situations. eGFR's persistently <60 mL/min signify possible Chronic Kidney Disease.   . Anion gap 10/18/2017 9  5 - 15 Final  . Iron 10/18/2017 81  28 - 170 ug/dL Final  . TIBC 10/18/2017 442  250 - 450 ug/dL Final  . Saturation Ratios 10/18/2017 18  10.4 - 31.8 % Final  . UIBC 10/18/2017 361  ug/dL Final   Performed at [facility], [place] [address], [place], [place] [address]  . Ferritin 10/18/2017 14  11 - 307 ng/mL Final   Performed at [facility], [place] [address], [place], [place] [address]     Pathology Orders Placed This Encounter  Procedures  . MM 3D SCREEN BREAST BILATERAL    Standing Status:   Future    Standing Expiration Date:   08/19/2019    Order Specific Question:   Reason for Exam (SYMPTOM  OR DIAGNOSIS REQUIRED)    Answer:   dcis    Order Specific Question:   Preferred imaging location?    Answer:   [facility]  . CBC with Differential/Platelet    Standing Status:   Future    Standing Expiration Date:   06/18/2020  . Comprehensive metabolic panel    Standing Status:   Future    Standing Expiration Date:   06/18/2020  . Lactate dehydrogenase    Standing Status:   Future    Standing Expiration Date:   06/18/2020  . Ferritin    Standing Status:   Future    Standing Expiration Date:   06/18/2020  Zoila Shutter MD

## 2018-07-02 ENCOUNTER — Ambulatory Visit (INDEPENDENT_AMBULATORY_CARE_PROVIDER_SITE_OTHER): Payer: Medicare Other | Admitting: Urology

## 2018-07-02 DIAGNOSIS — R3915 Urgency of urination: Secondary | ICD-10-CM | POA: Diagnosis not present

## 2018-07-02 DIAGNOSIS — R32 Unspecified urinary incontinence: Secondary | ICD-10-CM | POA: Diagnosis not present

## 2018-08-19 ENCOUNTER — Encounter: Payer: Self-pay | Admitting: "Endocrinology

## 2018-08-19 ENCOUNTER — Ambulatory Visit (INDEPENDENT_AMBULATORY_CARE_PROVIDER_SITE_OTHER): Payer: Medicare Other | Admitting: "Endocrinology

## 2018-08-19 VITALS — BP 132/84 | HR 57 | Ht 64.5 in | Wt 155.0 lb

## 2018-08-19 DIAGNOSIS — Z85850 Personal history of malignant neoplasm of thyroid: Secondary | ICD-10-CM

## 2018-08-19 DIAGNOSIS — E039 Hypothyroidism, unspecified: Secondary | ICD-10-CM | POA: Diagnosis not present

## 2018-08-19 NOTE — Progress Notes (Signed)
Endocrinology Consult Note                                            08/19/2018, 5:28 PM   Subjective:    Patient ID: Angelica Grimes, female    DOB: Feb 28, 1935, PCP Octavio Graves, DO   Past Medical History:  Diagnosis Date  . Anxiety   . Cancer ([place])    breast  . Cancer ([place])    thyroid  . Fracture ankle fracture 01/24/16  . GERD (gastroesophageal reflux disease)   . Hyperlipidemia   . Hypertension   . Hypothyroidism   . IBS (irritable bowel syndrome)    Past Surgical History:  Procedure Laterality Date  . breast cyst surgery      x 2 - benign   . BREAST LUMPECTOMY     right breast with re-excision  . [place]   biopsy  . BUNIONECTOMY  2012   right foot  . COLONOSCOPY    . CYSTECTOMY  1983  . THYROIDECTOMY  1991   due to cancer  . TONSILLECTOMY     Social History   Socioeconomic History  . Marital status: Widowed    Spouse name: Not on file  . Number of children: Not on file  . Years of education: Not on file  . Highest education level: Not on file  Occupational History  . Not on file  Social Needs  . Financial resource strain: Not on file  . Food insecurity:    Worry: Not on file    Inability: Not on file  . Transportation needs:    Medical: Not on file    Non-medical: Not on file  Tobacco Use  . Smoking status: Never Smoker  . Smokeless tobacco: Never Used  Substance and Sexual Activity  . Alcohol use: Yes    Comment: rare  . Drug use: No  . Sexual activity: Not on file  Lifestyle  . Physical activity:    Days per week: Not on file    Minutes per session: Not on file  . Stress: Not on file  Relationships  . Social connections:    Talks on phone: Not on file    Gets together: Not on file    Attends religious service: Not on file    Active member of club or organization: Not on file    Attends meetings of clubs or organizations: Not on file    Relationship status: Not on file  Other Topics Concern  .  Not on file  Social History Narrative  . Not on file   Outpatient Encounter Medications as of 08/19/2018  Medication Sig  . amLODipine (NORVASC) 5 MG tablet Take 5 mg by mouth daily.  Marland Kitchen CALCIUM-MAGNESIUM-ZINC PO Take 2 tablets by mouth daily.  . carvedilol (COREG) 25 MG tablet Take 25 mg by mouth 2 (two) times daily. as directed  . co-enzyme Q-10 50 MG capsule Take 100 mg by mouth daily.  . Glucosamine-Chondroitin (OSTEO BI-FLEX REGULAR STRENGTH PO) Take 2 tablets by mouth daily.  . hydrochlorothiazide (HYDRODIURIL) 25 MG tablet Take 25 mg by mouth daily with breakfast.   . omeprazole (PRILOSEC) 20 MG capsule Take 20 mg by mouth daily.   . potassium chloride SA (K-DUR,KLOR-CON) 20 MEQ tablet Take 20 mEq by mouth daily.   . rosuvastatin (CRESTOR)  10 MG tablet Take 10 mg by mouth daily.  Marland Kitchen SYNTHROID 88 MCG tablet Take 88 mcg by mouth daily.  Marland Kitchen ALPRAZolam (XANAX) 0.25 MG tablet Take 0.25 mg by mouth as needed. anxiety  . [DISCONTINUED] CRESTOR 10 MG tablet 10 mg daily.    No facility-administered encounter medications on file as of 08/19/2018.    ALLERGIES: Allergies  Allergen Reactions  . Shrimp [Shellfish Allergy] Hives    Shrimp only  . Aspirin Nausea Only and Other (See Comments)    Tightness of chest  . Benazepril Swelling    Entire face, lips, throat  . Penicillins Hives, Itching and Rash    Abdominal area only  . Clonidine Derivatives Other (See Comments)    Dryness of mouth, lowers blood pressure and pulse   . Orange Fruit [Citrus] Rash    Fever blisters and blisters on knuckles     VACCINATION STATUS: Immunization History  Administered Date(s) Administered  . Influenza Split 06/19/2012  . Influenza-Unspecified 08/01/2015    HPI Angelica Grimes is 83 y.o. female who presents today with a medical history as above. she is being seen in consultation for postsurgical hypothyroidism requested by Octavio Graves, DO.   Her history starts in the 1990s when she underwent  what appears to be total thyroidectomy for thyroid malignancy.  The details of her surgery/treatment are not available for review.  She does not recall if she was given radioactive iodine thyroid ablation. -She does not have recent thyroid/neck ultrasound.  She does not have recent thyroid function test.  She was given various dose of Synthroid over the years, currently on 100 mcg of synthyroid .  She is compliant in taking this medication properly.   Review of Systems  Constitutional: no weight gain/loss, no fatigue, no subjective hyperthermia, no subjective hypothermia Eyes: no blurry vision, no xerophthalmia ENT: no sore throat, no nodules palpated in throat, no dysphagia/odynophagia, no hoarseness Cardiovascular: no Chest Pain, no Shortness of Breath, no palpitations, no leg swelling Respiratory: no cough, no SOB Gastrointestinal: no Nausea/Vomiting/Diarhhea Musculoskeletal: no muscle/joint aches Skin: no rashes Neurological: no tremors, no numbness, no tingling, no dizziness Psychiatric: no depression, no anxiety  Objective:    BP 132/84   Pulse (!) 57   Ht 5' 4.5" (1.638 m)   Wt 155 lb (70.3 kg)   BMI 26.19 kg/m   Wt Readings from Last 3 Encounters:  08/19/18 155 lb (70.3 kg)  06/18/18 158 lb 3.2 oz (71.8 kg)  05/01/18 157 lb (71.2 kg)    Physical Exam  Constitutional:  appropriate weight for height, not in acute distress, normal state of mind Eyes: PERRLA, EOMI, no exophthalmos ENT: moist mucous membranes, + old well-healed scar on anterior lower neck,  no cervical lymphadenopathy Cardiovascular: normal precordial activity, Regular Rate and Rhythm, no Murmur/Rubs/Gallops Respiratory:  adequate breathing efforts, no gross chest deformity, Clear to auscultation bilaterally Gastrointestinal: abdomen soft, Non -tender, No distension, Bowel Sounds present Musculoskeletal: no gross deformities, strength intact in all four extremities Skin: moist, warm, no rashes Neurological:  no tremor with outstretched hands, Deep tendon reflexes normal in all four extremities.  CMP ( most recent) CMP     Component Value Date/Time   NA 137 10/18/2017 1327   K 3.3 (L) 10/18/2017 1327   CL 101 10/18/2017 1327   CO2 27 10/18/2017 1327   GLUCOSE 96 10/18/2017 1327   BUN 14 10/18/2017 1327   CREATININE 0.87 10/18/2017 1327   CALCIUM 8.1 (L) 10/18/2017 1327  PROT 6.5 10/18/2017 1327   ALBUMIN 3.5 10/18/2017 1327   AST 20 10/18/2017 1327   ALT 15 10/18/2017 1327   ALKPHOS 39 10/18/2017 1327   BILITOT 0.5 10/18/2017 1327   GFRNONAA >60 10/18/2017 1327   GFRAA >60 10/18/2017 1327     Lipid Panel ( most recent) Lipid Panel     Component Value Date/Time   CHOL 141 02/29/2016 0925   TRIG 117 02/29/2016 0925   HDL 51 02/29/2016 0925   CHOLHDL 2.8 02/29/2016 0925   VLDL 23 02/29/2016 0925   LDLCALC 67 02/29/2016 0925      Lab Results  Component Value Date   TSH 0.949 10/03/2016   TSH 0.813 02/29/2016   TSH 1.459 02/17/2015   TSH 2.310 08/07/2014   TSH 8.552 (H) 10/10/2013      Assessment & Plan:   1. Hypothyroidism, unspecified type 2. Hx of thyroid cancer  - Angelica Grimes  is being seen at a kind request of Octavio Graves, DO. - I have reviewed her available thyroid records and clinically evaluated the patient. - Based on reviews, she has history of thyroid malignancy treated with surgery in the remote past,  however,  there is not sufficient recent information to proceed with definitive treatment plan.  For now, I advised her to continue Synthroid 88 mcg p.o. every morning.    - We discussed about correct intake of levothyroxine, at fasting, with water, separated by at least 30 minutes from breakfast, and separated by more than 4 hours from calcium, iron, multivitamins, acid reflux medications (PPIs). -Patient is made aware of the fact that thyroid hormone replacement is needed for life, dose to be adjusted by periodic monitoring of thyroid function  tests. - she will need a repeat thyroid function test as well as thyroid/neck sonogram as soon as possible and she will return in 2 weeks to review this studies.    - I did not initiate any new prescriptions today. - I advised her  to maintain close follow up with Octavio Graves, DO for primary care needs.  Follow up plan: Return in about 2 weeks (around 09/02/2018) for Labs Today- Non-Fasting Ok, Thyroid / Neck Ultrasound.   Glade Lloyd, MD [facility] [address] [place], [place] [address] Phone: [phone]  Fax: [phone]     08/19/2018, 5:28 PM  This note was partially dictated with voice recognition software. Similar sounding words can be transcribed inadequately or may not  be corrected upon review.

## 2018-09-06 ENCOUNTER — Encounter: Payer: Self-pay | Admitting: "Endocrinology

## 2018-09-06 LAB — TSH: TSH: 2.48 (ref 0.41–5.90)

## 2018-09-10 ENCOUNTER — Ambulatory Visit (INDEPENDENT_AMBULATORY_CARE_PROVIDER_SITE_OTHER): Payer: Medicare Other | Admitting: "Endocrinology

## 2018-09-10 ENCOUNTER — Encounter: Payer: Self-pay | Admitting: "Endocrinology

## 2018-09-10 VITALS — BP 135/83 | HR 70 | Ht 64.5 in | Wt 157.0 lb

## 2018-09-10 DIAGNOSIS — E890 Postprocedural hypothyroidism: Secondary | ICD-10-CM

## 2018-09-10 MED ORDER — SYNTHROID 88 MCG PO TABS: 88.0000 ug | ORAL_TABLET | Freq: Every day | ORAL | 1 refills | Status: DC

## 2018-09-10 NOTE — Progress Notes (Signed)
Endocrinology follow-up note                                            09/10/2018, 12:48 PM   Subjective:    Patient ID: Angelica Grimes, female    DOB: [birth year], PCP Octavio Graves, DO   Past Medical History:  Diagnosis Date  . Anxiety   . Cancer ([place])    breast  . Cancer ([place])    thyroid  . Fracture ankle fracture 01/24/16  . GERD (gastroesophageal reflux disease)   . Hyperlipidemia   . Hypertension   . Hypothyroidism   . IBS (irritable bowel syndrome)    Past Surgical History:  Procedure Laterality Date  . breast cyst surgery      x 2 - benign   . BREAST LUMPECTOMY     right breast with re-excision  . [place]   biopsy  . BUNIONECTOMY  2012   right foot  . COLONOSCOPY    . CYSTECTOMY  1983  . THYROIDECTOMY  1991   due to cancer  . TONSILLECTOMY     Social History   Socioeconomic History  . Marital status: Widowed    Spouse name: Not on file  . Number of children: Not on file  . Years of education: Not on file  . Highest education level: Not on file  Occupational History  . Not on file  Social Needs  . Financial resource strain: Not on file  . Food insecurity:    Worry: Not on file    Inability: Not on file  . Transportation needs:    Medical: Not on file    Non-medical: Not on file  Tobacco Use  . Smoking status: Never Smoker  . Smokeless tobacco: Never Used  Substance and Sexual Activity  . Alcohol use: Yes    Comment: rare  . Drug use: No  . Sexual activity: Not on file  Lifestyle  . Physical activity:    Days per week: Not on file    Minutes per session: Not on file  . Stress: Not on file  Relationships  . Social connections:    Talks on phone: Not on file    Gets together: Not on file    Attends religious service: Not on file    Active member of club or organization: Not on file    Attends meetings of clubs or organizations: Not on file    Relationship status: Not on file  Other Topics Concern   . Not on file  Social History Narrative  . Not on file   Outpatient Encounter Medications as of 09/10/2018  Medication Sig  . ALPRAZolam (XANAX) 0.25 MG tablet Take 0.25 mg by mouth as needed. anxiety  . CALCIUM-MAGNESIUM-ZINC PO Take 2 tablets by mouth daily.  . carvedilol (COREG) 25 MG tablet Take 25 mg by mouth 2 (two) times daily. as directed  . co-enzyme Q-10 50 MG capsule Take 100 mg by mouth daily.  . Glucosamine-Chondroitin (OSTEO BI-FLEX REGULAR STRENGTH PO) Take 2 tablets by mouth daily.  . hydrochlorothiazide (HYDRODIURIL) 25 MG tablet Take 25 mg by mouth daily as needed.   Marland Kitchen omeprazole (PRILOSEC) 20 MG capsule Take 20 mg by mouth daily.   . potassium chloride SA (K-DUR,KLOR-CON) 20 MEQ tablet Take 20 mEq by mouth daily.   Marland Kitchen  rosuvastatin (CRESTOR) 10 MG tablet Take 10 mg by mouth daily.  Marland Kitchen SYNTHROID 88 MCG tablet Take 1 tablet (88 mcg total) by mouth daily.  . [DISCONTINUED] amLODipine (NORVASC) 5 MG tablet Take 5 mg by mouth daily.  . [DISCONTINUED] SYNTHROID 88 MCG tablet Take 88 mcg by mouth daily.   No facility-administered encounter medications on file as of 09/10/2018.    ALLERGIES: Allergies  Allergen Reactions  . Shrimp [Shellfish Allergy] Hives    Shrimp only  . Aspirin Nausea Only and Other (See Comments)    Tightness of chest  . Benazepril Swelling    Entire face, lips, throat  . Penicillins Hives, Itching and Rash    Abdominal area only  . Clonidine Derivatives Other (See Comments)    Dryness of mouth, lowers blood pressure and pulse   . Orange Fruit [Citrus] Rash    Fever blisters and blisters on knuckles     VACCINATION STATUS: Immunization History  Administered Date(s) Administered  . Influenza Split 06/19/2012  . Influenza-Unspecified 08/01/2015    HPI Angelica Grimes is 83 y.o. female who presents today with a medical history as above. she is being seen in follow-up for postsurgical hypothyroidism requested by Octavio Graves, DO.   Her  history starts in the 1990s when she underwent what appears to be total thyroidectomy for thyroid malignancy.  The details of her surgery/treatment are not available for review.  She does not recall if she was given radioactive iodine thyroid ablation. -She does not have recent thyroid/neck ultrasound.  She does not have recent thyroid function test.  She was given various dose of Synthroid over the years, currently on 88 mcg of synthyroid .  She is compliant in taking this medication properly.  She has no new complaints today.   Review of Systems  Constitutional: + Steady weight, + ongoing fatigue, no subjective hyperthermia, no subjective hypothermia Eyes: no blurry vision, no xerophthalmia ENT: no sore throat, no nodules palpated in throat, no dysphagia/odynophagia, no hoarseness Musculoskeletal: no muscle/joint aches Skin: no rashes Neurological: no tremors, no numbness, no tingling, no dizziness Psychiatric: no depression, no anxiety  Objective:    BP 135/83   Pulse 70   Ht 5' 4.5" (1.638 m)   Wt 157 lb (71.2 kg)   BMI 26.53 kg/m   Wt Readings from Last 3 Encounters:  09/10/18 157 lb (71.2 kg)  08/19/18 155 lb (70.3 kg)  06/18/18 158 lb 3.2 oz (71.8 kg)    Physical Exam  Constitutional:  appropriate weight for height, not in acute distress, normal state of mind Eyes: PERRLA, EOMI, no exophthalmos ENT: moist mucous membranes, + old well-healed scar on anterior lower neck,  no cervical lymphadenopathy Musculoskeletal: no gross deformities, strength intact in all four extremities Skin: moist, warm, no rashes Neurological: no tremor with outstretched hands, Deep tendon reflexes normal in all four extremities.  CMP ( most recent) CMP     Component Value Date/Time   NA 137 10/18/2017 1327   K 3.3 (L) 10/18/2017 1327   CL 101 10/18/2017 1327   CO2 27 10/18/2017 1327   GLUCOSE 96 10/18/2017 1327   BUN 14 10/18/2017 1327   CREATININE 0.87 10/18/2017 1327   CALCIUM 8.1 (L)  10/18/2017 1327   PROT 6.5 10/18/2017 1327   ALBUMIN 3.5 10/18/2017 1327   AST 20 10/18/2017 1327   ALT 15 10/18/2017 1327   ALKPHOS 39 10/18/2017 1327   BILITOT 0.5 10/18/2017 1327   GFRNONAA >60 10/18/2017 1327  GFRAA >60 10/18/2017 1327     Lipid Panel ( most recent) Lipid Panel     Component Value Date/Time   CHOL 141 02/29/2016 0925   TRIG 117 02/29/2016 0925   HDL 51 02/29/2016 0925   CHOLHDL 2.8 02/29/2016 0925   VLDL 23 02/29/2016 0925   LDLCALC 67 02/29/2016 0925      Lab Results  Component Value Date   TSH 2.48 09/06/2018   TSH 0.949 10/03/2016   TSH 0.813 02/29/2016   TSH 1.459 02/17/2015   TSH 2.310 08/07/2014   TSH 8.552 (H) 10/10/2013    Thyroid/neck ultrasound on September 06, 2018 [facility]: Impression status post total thyroidectomy.  No significant finding by ultrasound.  Assessment & Plan:   1. Hypothyroidism, unspecified type 2. Hx of thyroid cancer  -Her previsit thyroid/neck ultrasound is consistent with status post total thyroidectomy. -Her previsit thyroid function tests are consistent with slight over replacement, however she will continue to benefit from her current dose of Synthroid.  She is advised to continue Synthroid 88 mcg p.o. every morning.  - We discussed about correct intake of levothyroxine, at fasting, with water, separated by at least 30 minutes from breakfast, and separated by more than 4 hours from calcium, iron, multivitamins, acid reflux medications (PPIs). -Patient is made aware of the fact that thyroid hormone replacement is needed for life, dose to be adjusted by periodic monitoring of thyroid function tests.  - I advised her  to maintain close follow up with Octavio Graves, DO for primary care needs.  Follow up plan: Return in about 3 months (around 12/10/2018).   Glade Lloyd, MD [facility] [address] [place], [place]  [address] Phone: [phone]  Fax: [phone]     09/10/2018, 12:48 PM  This note was partially dictated with voice recognition software. Similar sounding words can be transcribed inadequately or may not  be corrected upon review.

## 2018-10-14 ENCOUNTER — Emergency Department (HOSPITAL_COMMUNITY): Payer: Medicare Other

## 2018-10-14 ENCOUNTER — Emergency Department (HOSPITAL_COMMUNITY)
Admission: EM | Admit: 2018-10-14 | Discharge: 2018-10-14 | Disposition: A | Discharge status: Home / Self Care | Payer: Medicare Other | Attending: Emergency Medicine | Admitting: Emergency Medicine

## 2018-10-14 ENCOUNTER — Encounter (HOSPITAL_COMMUNITY): Payer: Self-pay | Admitting: Emergency Medicine

## 2018-10-14 ENCOUNTER — Other Ambulatory Visit: Payer: Self-pay

## 2018-10-14 DIAGNOSIS — R9402 Abnormal brain scan: Secondary | ICD-10-CM | POA: Diagnosis not present

## 2018-10-14 DIAGNOSIS — I10 Essential (primary) hypertension: Secondary | ICD-10-CM | POA: Diagnosis present

## 2018-10-14 DIAGNOSIS — R51 Headache: Secondary | ICD-10-CM | POA: Insufficient documentation

## 2018-10-14 DIAGNOSIS — G9389 Other specified disorders of brain: Secondary | ICD-10-CM | POA: Insufficient documentation

## 2018-10-14 DIAGNOSIS — E039 Hypothyroidism, unspecified: Secondary | ICD-10-CM | POA: Insufficient documentation

## 2018-10-14 DIAGNOSIS — Z853 Personal history of malignant neoplasm of breast: Secondary | ICD-10-CM | POA: Diagnosis not present

## 2018-10-14 DIAGNOSIS — Z85850 Personal history of malignant neoplasm of thyroid: Secondary | ICD-10-CM | POA: Insufficient documentation

## 2018-10-14 DIAGNOSIS — Z79899 Other long term (current) drug therapy: Secondary | ICD-10-CM | POA: Diagnosis not present

## 2018-10-14 LAB — BASIC METABOLIC PANEL
Anion gap: 6 (ref 5–15)
BUN: 16 mg/dL (ref 8–23)
CHLORIDE: 106 mmol/L (ref 98–111)
CO2: 25 mmol/L (ref 22–32)
Calcium: 8.5 mg/dL — ABNORMAL LOW (ref 8.9–10.3)
Creatinine, Ser: 0.8 mg/dL (ref 0.44–1.00)
GFR calc Af Amer: >60 mL/min (ref >60)
GFR calc non Af Amer: >60 mL/min (ref >60)
Glucose, Bld: 116 mg/dL — ABNORMAL HIGH (ref 70–99)
Potassium: 3.3 mmol/L — ABNORMAL LOW (ref 3.5–5.1)
SODIUM: 137 mmol/L (ref 135–145)

## 2018-10-14 LAB — CBC WITH DIFFERENTIAL/PLATELET
Abs Immature Granulocytes: 0.03 10*3/uL (ref 0.00–0.07)
Basophils Absolute: 0 10*3/uL (ref 0.0–0.1)
Basophils Relative: 0 %
Eosinophils Absolute: 0.2 10*3/uL (ref 0.0–0.5)
Eosinophils Relative: 3 %
HCT: 37.7 % (ref 36.0–46.0)
HEMOGLOBIN: 12 g/dL (ref 12.0–15.0)
Immature Granulocytes: 0 %
Lymphocytes Relative: 22 %
Lymphs Abs: 1.6 10*3/uL (ref 0.7–4.0)
MCH: 28 pg (ref 26.0–34.0)
MCHC: 31.8 g/dL (ref 30.0–36.0)
MCV: 88.1 fL (ref 80.0–100.0)
Monocytes Absolute: 0.6 10*3/uL (ref 0.1–1.0)
Monocytes Relative: 9 %
Neutro Abs: 4.9 10*3/uL (ref 1.7–7.7)
Neutrophils Relative %: 66 %
Platelets: 141 10*3/uL — ABNORMAL LOW (ref 150–400)
RBC: 4.28 MIL/uL (ref 3.87–5.11)
RDW: 13.4 % (ref 11.5–15.5)
WBC: 7.5 10*3/uL (ref 4.0–10.5)
nRBC: 0 % (ref 0.0–0.2)

## 2018-10-14 LAB — TROPONIN I: Troponin I: <0.03 ng/mL (ref <0.03)

## 2018-10-14 LAB — SEDIMENTATION RATE: Sed Rate: 7 mm/hr (ref 0–22)

## 2018-10-14 MED ORDER — GADOBUTROL 1 MMOL/ML IV SOLN
7.5000 mL | Freq: Once | INTRAVENOUS | Status: AC | PRN
  Administered 2018-10-14: 7 mL via INTRAVENOUS

## 2018-10-14 MED ORDER — HYDROCHLOROTHIAZIDE 25 MG PO TABS: 25.0000 mg | ORAL_TABLET | Freq: Two times a day (BID) | ORAL | 1 refills | Status: DC

## 2018-10-14 MED ORDER — TETRACAINE HCL 0.5 % OP SOLN
2.0000 [drp] | Freq: Once | OPHTHALMIC | Status: AC
  Administered 2018-10-14: 2 [drp] via OPHTHALMIC
  Filled 2018-10-14: qty 4

## 2018-10-14 NOTE — Discharge Instructions (Addendum)
Please return for any problem.  Follow-up with your regular care provider as instructed.  As per Dr. Clarice Pole instructions you need to follow-up with him in his office as an outpatient.

## 2018-10-14 NOTE — ED Notes (Signed)
Neuro surgery at bedside.

## 2018-10-14 NOTE — Consult Note (Signed)
Reason for Consult: Brain tumor Referring Physician: Dr. Willadean Angelica Grimes is an 84 y.o. female.  HPI: Patient is an 84 year old right-handed individual who has developed recent onset of some headaches.  She has a longstanding history of blood pressure which is noted to be rather labile here in a emergency department.  She is seen and treated by Dr. Octavio Graves but has not seen her recently.  Because of the headaches she presented to the emergency department at any [place] scan of the brain was performed.  This demonstrates the presence of an intraventricular lesion in the left atrium of the ventricle.  There is no significant mass-effect.  Because this was a new diagnosis she was transferred to [facility] where an MRI of the brain with and without gadolinium has been performed.  I was contacted to see the patient regarding this new diagnosis of intraventricular brain tumor.  The patient notes that she is not had any longstanding history of headaches the headaches have been more of recent onset her blood pressure she notes on her own has been fairly labile at home and has been labile during her 8-hour emergency room stay.  She denies any double vision denies any blurred vision denies any ringing in the ears or unsteadiness of gait.  Her singular complaint has been that of recent onset of the headaches.  Past Medical History:  Diagnosis Date  . Anxiety   . Cancer ([place])    breast  . Cancer ([place])    thyroid  . Fracture ankle fracture 01/24/16  . GERD (gastroesophageal reflux disease)   . Hyperlipidemia   . Hypertension   . Hypothyroidism   . IBS (irritable bowel syndrome)     Past Surgical History:  Procedure Laterality Date  . breast cyst surgery      x 2 - benign   . BREAST LUMPECTOMY     right breast with re-excision  . [place]   biopsy  . BUNIONECTOMY  2012   right foot  . COLONOSCOPY    . CYSTECTOMY  1983  . THYROIDECTOMY   1991   due to cancer  . TONSILLECTOMY      Family History  Problem Relation Age of Onset  . Stroke Mother   . Pneumonia Mother   . Cancer Father        bladder, prostate  . Cancer Sister        kidney  . Cancer Brother        lung  . Cancer Sister        kidney  . Cancer Sister        lung  . Colon cancer Neg Hx     Social History:  reports that she has never smoked. She has never used smokeless tobacco. She reports current alcohol use. She reports that she does not use drugs.  Allergies:  Allergies  Allergen Reactions  . Shrimp [Shellfish Allergy] Hives    Shrimp only  . Aspirin Nausea Only and Other (See Comments)    Tightness of chest  . Benazepril Swelling    Entire face, lips, throat  . Penicillins Hives, Itching and Rash    Abdominal area only  . Clonidine Derivatives Other (See Comments)    Dryness of mouth, lowers blood pressure and pulse   . Orange Fruit [Citrus] Rash    Fever blisters and blisters on knuckles     Medications: I have reviewed  the patient's current medications.  Results for orders placed or performed during the hospital encounter of 10/14/18 (from the past 48 hour(s))  CBC with Differential/Platelet     Status: Abnormal   Collection Time: 10/14/18  2:07 AM  Result Value Ref Range   WBC 7.5 4.0 - 10.5 K/uL   RBC 4.28 3.87 - 5.11 MIL/uL   Hemoglobin 12.0 12.0 - 15.0 g/dL   HCT 37.7 36.0 - 46.0 %   MCV 88.1 80.0 - 100.0 fL   MCH 28.0 26.0 - 34.0 pg   MCHC 31.8 30.0 - 36.0 g/dL   RDW 13.4 11.5 - 15.5 %   Platelets 141 (L) 150 - 400 K/uL   nRBC 0.0 0.0 - 0.2 %   Neutrophils Relative % 66 %   Neutro Abs 4.9 1.7 - 7.7 K/uL   Lymphocytes Relative 22 %   Lymphs Abs 1.6 0.7 - 4.0 K/uL   Monocytes Relative 9 %   Monocytes Absolute 0.6 0.1 - 1.0 K/uL   Eosinophils Relative 3 %   Eosinophils Absolute 0.2 0.0 - 0.5 K/uL   Basophils Relative 0 %   Basophils Absolute 0.0 0.0 - 0.1 K/uL   Immature Granulocytes 0 %   Abs Immature Granulocytes  0.03 0.00 - 0.07 K/uL    Comment: Performed at [facility], [address], [place], [place] [address]  Basic metabolic panel     Status: Abnormal   Collection Time: 10/14/18  2:07 AM  Result Value Ref Range   Sodium 137 135 - 145 mmol/L   Potassium 3.3 (L) 3.5 - 5.1 mmol/L   Chloride 106 98 - 111 mmol/L   CO2 25 22 - 32 mmol/L   Glucose, Bld 116 (H) 70 - 99 mg/dL   BUN 16 8 - 23 mg/dL   Creatinine, Ser 0.80 0.44 - 1.00 mg/dL   Calcium 8.5 (L) 8.9 - 10.3 mg/dL   GFR calc non Af Amer >60 >60 mL/min   GFR calc Af Amer >60 >60 mL/min   Anion gap 6 5 - 15    Comment: Performed at [facility], [address], [place], [place] [address]  Troponin I - ONCE - STAT     Status: None   Collection Time: 10/14/18  2:07 AM  Result Value Ref Range   Troponin I <0.03 <0.03 ng/mL    Comment: Performed at [facility], [address], [place], [place] [address]  Sedimentation rate     Status: None   Collection Time: 10/14/18  2:07 AM  Result Value Ref Range   Sed Rate 7 0 - 22 mm/hr    Comment: Performed at [facility], [address], [place], [place] [address]    Ct Head Wo Contrast  Result Date: 10/14/2018 CLINICAL DATA:  High blood pressure today. Headache. EXAM: CT HEAD WITHOUT CONTRAST TECHNIQUE: Contiguous axial images were obtained from the base of the skull through the vertex without intravenous contrast. COMPARISON:  None. FINDINGS: Brain: Mild cerebral atrophy. There is a 3 cm diameter intraventricular mass in the posterior horn of the left lateral ventricle. Diffuse bilateral ventricular dilatation is out of proportion to the degree of cortical atrophy suggesting possible obstruction. MRI with contrast recommended for further evaluation. No midline shift. No abnormal extra-axial fluid collections. Gray-white matter junctions are distinct. Basal cisterns are not effaced. No acute intracranial hemorrhage. Vascular: Moderate intracranial arterial calcifications are present. Skull: Calvarium  appears intact. No acute depressed skull fractures. Sinuses/Orbits: Paranasal sinuses and mastoid air cells are  clear. Other: None. IMPRESSION: 3 cm diameter intraventricular mass in the posterior horn of the left lateral ventricle. Diffuse ventricular dilatation out of proportion to the degree of cortical atrophy suggesting possible obstruction. MRI with contrast recommended for further evaluation. No acute intracranial hemorrhage or mass effect. Electronically Signed   By: Lucienne Capers M.D.   On: 10/14/2018 02:43   Mr Jeri Cos And Wo Contrast  Result Date: 10/14/2018 CLINICAL DATA:  Headache and hypertension.  Abnormal CT. EXAM: MRI HEAD WITHOUT AND WITH CONTRAST TECHNIQUE: Multiplanar, multiecho pulse sequences of the brain and surrounding structures were obtained without and with intravenous contrast. CONTRAST:  7 cc Gadavist COMPARISON:  Head CT same day FINDINGS: Brain: The brain parenchyma itself shows mild age related volume loss with minimal small vessel change of the cerebral hemispheric white matter, less than often seen at this age. The ventricles are prominent, favored secondary to central atrophy. There is no evidence of intra-axial mass lesion. Along the left lateral margin of the posterior fossa, there is a 1 cm meningioma which is incidental. There is an intraventricular mass in the atrium of the left lateral ventricle measuring up to 3.6 cm in diameter with central cystic change. Tumoral signal characteristics follow gray matter, and the lesion shows the only very low level enhancement. Therefore, this is not consistent with an intraventricular meningioma or choroid plexus tumor. Unusual poorly enhancing choroid plexus metastasis could have this appearance. Central neurocytoma could have this appearance. The degree of enhancement is not typical of ependymoma. Unusual sub ependymoma or stent the granuloma is the radically possible. No evidence of hemorrhage or extra-axial collection.  Vascular: Major vessels at the base of the brain show flow. Skull and upper cervical spine: Negative Sinuses/Orbits: Clear/normal Other: None IMPRESSION: 1 cm meningioma along the lateral aspect of the posterior fossa on the left, incidental and insignificant at this time. 3.6 cm in diameter intraventricular mass in the region of the atrium of the lateral ventricle on the left. Very low level enhancement is atypical for the most common intraventricular mass lesions. Based on that feature, I think the differential diagnosis includes central neurocytoma (patient age not typical), unusual poorly enhancing metastasis and sub ependymoma. Based on the age, sub ependymoma would be the favored diagnosis, though the lesion is quite large with a considerable intraventricular location. Electronically Signed   By: Nelson Chimes M.D.   On: 10/14/2018 10:38    Review of Systems  Constitutional: Negative.   HENT: Negative.   Eyes: Negative.   Respiratory: Negative.   Cardiovascular: Negative.   Musculoskeletal: Negative.   Skin: Negative.   Neurological: Positive for headaches.  Endo/Heme/Allergies: Negative.   Psychiatric/Behavioral: Negative.    Blood pressure (!) 178/71, pulse 67, temperature 98 F (36.7 C), temperature source Oral, resp. rate 16, height 5\' 5"  (1.651 m), weight 68.9 kg, SpO2 97 %. Physical Exam  Constitutional: She is oriented to person, place, and time. She appears well-developed and well-nourished.  HENT:  Head: Normocephalic and atraumatic.  Eyes: Pupils are equal, round, and reactive to light. Conjunctivae and EOM are normal.  Neck: Normal range of motion. Neck supple.  GI: Soft. Bowel sounds are normal.  Neurological: She is alert and oriented to person, place, and time.  Cranial nerves are normal with normal fluent speech rapid alternating movements and finger-to-nose finger are intact Station and gait are intact.  Reflexes are symmetric in the biceps and triceps the patellae and  the Achilles Babinski's are downgoing.  Mentation appears to  be completely normal  Skin: Skin is warm and dry.  Psychiatric: She has a normal mood and affect. Her behavior is normal. Judgment and thought content normal.    Assessment/Plan: The patient has a recent onset of headaches with a scan that shows 2 incidental tumors one is a 1 cm posterior fossa meningioma in the periphery on the left side of the cerebellum.  This is likely related to the tentorium.  There is no significant mass-effect.  The second lesion is intraventricular in the atrium of the left lateral ventricle and measures over 3-1/2 cm in size.  There is little uptake of contrast.  This most likely represents a benign lesion and I have advised the patient that at this point this lesion should be followed with repeat scan in approximately 6 months.  I do believe that her headaches are more likely related to her labile blood pressure and have discussed this with Dr. Francia Greaves so that some other medication can be given to her to help control her blood pressure she can have follow-up with Dr. Octavio Graves I have advised that should the patient develop worsening symptoms despite good blood pressure control then a sooner follow-up visit can be arranged.  But in all likelihood this represents a benign lesion that has been there for some time.  We will plan a follow-up visit in approximately 6 times she knows to contact us sooner if symptoms should worsen.  We send a copy of this consult to Dr. Joella Prince Angelica Grimes 10/14/2018, 12:55 PM

## 2018-10-14 NOTE — ED Provider Notes (Signed)
5:30 AM  Patient transferred to here from [facility] ED for abnormal head CT.  Seen there for headache.  Patient describes right-sided headache.  Was found to be hypertensive.  Headache and blood pressure improved without intervention.  She states her headache is slowly returning.  Declines any medication at this time.  No vision changes.  No numbness, tingling or focal weakness.  Able to ambulate.  Head CT showed recently her intraventricular mass in the posterior horn of the left lateral ventricle with ventricular dilation out of proportion to degree of cortical atrophy suggesting possible obstruction.  MRI with contrast recommended by radiology.  Patient sent here for MRI.  MRI ordered.  Patient and friend updated with plan.  7:10 AM  Pt's pressure is 160s/80s.  Headache is waxing and waning.  She still declines any medication.  Discussed delay in MRI.  Patient and family verbalized understanding.  Signed out to oncoming EDP.   I reviewed all nursing notes, vitals, pertinent previous records, EKGs, lab and urine results, imaging (as available).    Darick Fetters, Delice Bison, DO 10/14/18 [phone]

## 2018-10-14 NOTE — ED Notes (Signed)
Patient here from [facility] for MRI.  She has been having HAs with hypertension.  No chest pain, no shortness of breath, some nausea, no vomiting.

## 2018-10-14 NOTE — ED Provider Notes (Signed)
[facility] EMERGENCY DEPARTMENT Provider Note   CSN: [id number] Arrival date & time: 10/14/18  0035     History   Chief Complaint Chief Complaint  Patient presents with  . Hypertension    HPI Angelica Grimes is a 84 y.o. female.  Patient from home with elevated blood pressure.  States her blood pressure has been running high this evening around 10 PM her blood pressure was 210/109.  She checked it multiple times since then and it has been consistently over 200.  Reports that she does have hypertension and states compliance with her medications.  No recent changes.  She takes hydrochlorothiazide as well as Coreg.  She was on amlodipine in the past but this cold ankle swelling and she has not had this for a long time.  She states her blood pressure has been in the 150s over the 160s the past several days and she is concerned it is getting worse.  She developed the gradual onset right-sided headache about 5 PM.  This is associated with pain behind her right eye.  No visual changes.  No focal weakness, numbness or tingling.  No chest pain or shortness of breath.  Denies any dizziness or lightheadedness.  Her headache is not worse with light or noise.  She denies any pressure in her chest or shortness of breath.  The history is provided by the patient.  Hypertension  Associated symptoms include headaches. Pertinent negatives include no chest pain, no abdominal pain and no shortness of breath.    Past Medical History:  Diagnosis Date  . Anxiety   . Cancer ([place])    breast  . Cancer ([place])    thyroid  . Fracture ankle fracture 01/24/16  . GERD (gastroesophageal reflux disease)   . Hyperlipidemia   . Hypertension   . Hypothyroidism   . IBS (irritable bowel syndrome)     Patient Active Problem List   Diagnosis Date Noted  . Postsurgical hypothyroidism 09/10/2018  . Positive colorectal cancer screening using Cologuard test 05/01/2018  . Low iron stores 11/08/2016  . Preventative  health care 02/29/2016  . 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 breast cyst surgery      x 2 - benign   . BREAST LUMPECTOMY     right breast with re-excision  . [place]   biopsy  . BUNIONECTOMY  2012   right foot  . COLONOSCOPY    . CYSTECTOMY  1983  . THYROIDECTOMY  1991   due to cancer  . TONSILLECTOMY       OB History   No obstetric history on file.      Home Medications    Prior to Admission medications   Medication Sig Start Date End Date Taking? Authorizing Provider  ALPRAZolam (XANAX) 0.25 MG tablet Take 0.25 mg by mouth as needed. anxiety    [provider]  CALCIUM-MAGNESIUM-ZINC PO Take 2 tablets by mouth daily.    [provider]  carvedilol (COREG) 25 MG tablet Take 25 mg by mouth 2 (two) times daily. as directed 02/12/18   [provider]  co-enzyme Q-10 50 MG capsule Take 100 mg by mouth daily.    [provider]  Glucosamine-Chondroitin (OSTEO BI-FLEX REGULAR STRENGTH PO) Take 2 tablets by mouth daily.    [provider]  hydrochlorothiazide (HYDRODIURIL) 25 MG tablet Take 25 mg by mouth daily as  needed.  03/13/12   [provider]  omeprazole (PRILOSEC) 20 MG capsule Take 20 mg by mouth daily.  03/13/12   [provider]  potassium chloride SA (K-DUR,KLOR-CON) 20 MEQ tablet Take 20 mEq by mouth daily.  03/13/12   [provider]  rosuvastatin (CRESTOR) 10 MG tablet Take 10 mg by mouth daily.    [provider]  SYNTHROID 88 MCG tablet Take 1 tablet (88 mcg total) by mouth daily. 09/10/18   Cassandria Anger, MD    Family History Family History  Problem Relation Age of Onset  . Stroke Mother   . Pneumonia Mother   . Cancer Father        bladder, prostate  . Cancer Sister        kidney  . Cancer Brother        lung  . Cancer Sister        kidney  . Cancer Sister         lung  . Colon cancer Neg Hx     Social History Social History   Tobacco Use  . Smoking status: Never Smoker  . Smokeless tobacco: Never Used  Substance Use Topics  . Alcohol use: Yes    Comment: rare  . Drug use: No     Allergies   Shrimp [shellfish allergy]; Aspirin; Benazepril; Penicillins; Clonidine derivatives; and Orange fruit [citrus]   Review of Systems Review of Systems  Constitutional: Negative for activity change, appetite change and fever.  HENT: Negative for congestion.   Eyes: Positive for pain. Negative for photophobia and visual disturbance.  Respiratory: Negative for cough, chest tightness and shortness of breath.   Cardiovascular: Negative for chest pain and leg swelling.  Gastrointestinal: Negative for abdominal pain, nausea and vomiting.  Endocrine: Negative for polyuria.  Genitourinary: Negative for dysuria and hematuria.  Musculoskeletal: Negative for arthralgias, back pain and myalgias.  Neurological: Positive for headaches. Negative for dizziness, speech difficulty, weakness and light-headedness.   all other systems are negative except as noted in the HPI and PMH.     Physical Exam Updated Vital Signs BP (!) 195/67   Pulse 62   Temp (!) 97.3 F (36.3 C) (Oral)   Resp 12   Ht 5\' 5"  (1.651 m)   Wt 68.9 kg   SpO2 97%   BMI 25.29 kg/m   Physical Exam Vitals signs and nursing note reviewed.  Constitutional:      General: She is not in acute distress.    Appearance: She is well-developed.  HENT:     Head: Normocephalic and atraumatic.     Comments: No temporal artery tenderness    Mouth/Throat:     Pharynx: No oropharyngeal exudate.  Eyes:     Conjunctiva/sclera: Conjunctivae normal.     Pupils: Pupils are equal, round, and reactive to light.     Comments: Intraocular pressure 13 on right  Neck:     Musculoskeletal: Normal range of motion and neck supple.     Comments: No meningismus. Cardiovascular:     Rate and Rhythm: Normal  rate and regular rhythm.     Heart sounds: Normal heart sounds. No murmur.  Pulmonary:     Effort: Pulmonary effort is normal. No respiratory distress.     Breath sounds: Normal breath sounds.  Abdominal:     Palpations: Abdomen is soft.     Tenderness: There is no abdominal tenderness. There is no guarding or rebound.  Musculoskeletal: Normal range of motion.  General: No tenderness.  Skin:    General: Skin is warm.     Capillary Refill: Capillary refill takes less than 2 seconds.  Neurological:     Mental Status: She is alert and oriented to person, place, and time.     Cranial Nerves: No cranial nerve deficit.     Motor: No abnormal muscle tone.     Coordination: Coordination normal.     Comments: CN 2-12 intact, no ataxia on finger to nose, no nystagmus, 5/5 strength throughout, no pronator drift, Romberg negative, normal gait.   Psychiatric:        Behavior: Behavior normal.      ED Treatments / Results  Labs (all labs ordered are listed, but only abnormal results are displayed) Labs Reviewed  CBC WITH DIFFERENTIAL/PLATELET - Abnormal; Notable for the following components:      Result Value   Platelets 141 (*)    All other components within normal limits  BASIC METABOLIC PANEL - Abnormal; Notable for the following components:   Potassium 3.3 (*)    Glucose, Bld 116 (*)    Calcium 8.5 (*)    All other components within normal limits  TROPONIN I  SEDIMENTATION RATE    EKG EKG Interpretation  Date/Time:  Monday October 14 2018 01:24:35 EST Ventricular Rate:  63 PR Interval:    QRS Duration: 108 QT Interval:  408 QTC Calculation: 418 R Axis:   72 Text Interpretation:  Sinus rhythm No significant change was found Confirmed by Ezequiel Essex [phone]) on 10/14/2018 1:30:33 AM   Radiology Ct Head Wo Contrast  Result Date: 10/14/2018 CLINICAL DATA:  High blood pressure today. Headache. EXAM: CT HEAD WITHOUT CONTRAST TECHNIQUE: Contiguous axial images were  obtained from the base of the skull through the vertex without intravenous contrast. COMPARISON:  None. FINDINGS: Brain: Mild cerebral atrophy. There is a 3 cm diameter intraventricular mass in the posterior horn of the left lateral ventricle. Diffuse bilateral ventricular dilatation is out of proportion to the degree of cortical atrophy suggesting possible obstruction. MRI with contrast recommended for further evaluation. No midline shift. No abnormal extra-axial fluid collections. Gray-white matter junctions are distinct. Basal cisterns are not effaced. No acute intracranial hemorrhage. Vascular: Moderate intracranial arterial calcifications are present. Skull: Calvarium appears intact. No acute depressed skull fractures. Sinuses/Orbits: Paranasal sinuses and mastoid air cells are clear. Other: None. IMPRESSION: 3 cm diameter intraventricular mass in the posterior horn of the left lateral ventricle. Diffuse ventricular dilatation out of proportion to the degree of cortical atrophy suggesting possible obstruction. MRI with contrast recommended for further evaluation. No acute intracranial hemorrhage or mass effect. Electronically Signed   By: Lucienne Capers M.D.   On: 10/14/2018 02:43    Procedures Procedures (including critical care time)  Medications Ordered in ED Medications  tetracaine (PONTOCAINE) 0.5 % ophthalmic solution 2 drop (has no administration in time range)     Initial Impression / Assessment and Plan / ED Course  I have reviewed the triage vital signs and the nursing notes.  Pertinent labs & imaging results that were available during my care of the patient were reviewed by me and considered in my medical decision making (see chart for details).    Elevated blood pressure, headache without thunderclap onset.  No focal neurological deficits.  Blood pressure has improved to 542 systolic without further intervention.   Labs reassuring.  Blood pressure has improved to 131/91  without further intervention. Neuro intact.  CT head shows left-sided intraventricular mass.  MRI recommended.  This is not where patient is having her headache.  Low suspicion for meningitis, temporal arteritis, subarachnoid hemorrhage.  Sed rate is normal.  Discussed with patient to keep a record of her blood pressure and follow-up with your doctor for further medication adjustments.  Abnormal CT scan findings discussed with patient.  She is agreeable to go for MRI tonight which is not available at this facility.  Patient agreeable to have her sister drive her to [facility].  Discussed with Dr. Leonides Schanz who accepts patient to the ED. Final Clinical Impressions(s) / ED Diagnoses   Final diagnoses:  Brain mass  Essential hypertension    ED Discharge Orders    None       Ling Flesch, Annie Main, MD 10/14/18 [phone]

## 2018-10-14 NOTE — ED Triage Notes (Signed)
Pt states her home blood pressure monitor has been running high (210/109,205/112,201/101,186/103) since 2000.

## 2018-10-14 NOTE — ED Provider Notes (Signed)
1230 Patient seen after signout from prior providers.  Patient has been evaluated by Dr. Ellene Route from neurosurgery in the ED.  She is appropriate for discharge.  She will follow-up with Dr. Ellene Route as an outpatient.  Patient does understand plan of care.  She understands her MRI findings and the need for further observation and work-up with neurosurgery.  Importance of close follow-up is stressed.   Valarie Merino, MD 10/14/18 1227

## 2018-12-13 ENCOUNTER — Ambulatory Visit: Payer: Medicare Other | Admitting: "Endocrinology

## 2019-02-17 ENCOUNTER — Other Ambulatory Visit: Payer: Self-pay | Admitting: "Endocrinology

## 2019-03-18 DIAGNOSIS — D497 Neoplasm of unspecified behavior of endocrine glands and other parts of nervous system: Secondary | ICD-10-CM | POA: Insufficient documentation

## 2019-04-10 ENCOUNTER — Other Ambulatory Visit (HOSPITAL_COMMUNITY)
Admission: RE | Admit: 2019-04-10 | Discharge: 2019-04-10 | Disposition: A | Discharge status: Home / Self Care | Payer: Medicare Other | Source: Ambulatory Visit | Attending: Neurological Surgery | Admitting: Neurological Surgery

## 2019-04-10 ENCOUNTER — Other Ambulatory Visit: Payer: Self-pay

## 2019-04-10 DIAGNOSIS — D329 Benign neoplasm of meninges, unspecified: Secondary | ICD-10-CM | POA: Insufficient documentation

## 2019-04-10 LAB — BUN: BUN: 10 mg/dL (ref 8–23)

## 2019-04-10 LAB — CREATININE, SERUM
Creatinine, Ser: 0.72 mg/dL (ref 0.44–1.00)
GFR calc Af Amer: >60 mL/min (ref >60)
GFR calc non Af Amer: >60 mL/min (ref >60)

## 2019-05-07 ENCOUNTER — Other Ambulatory Visit (HOSPITAL_COMMUNITY): Payer: Self-pay | Admitting: *Deleted

## 2019-05-07 DIAGNOSIS — R790 Abnormal level of blood mineral: Secondary | ICD-10-CM

## 2019-05-07 DIAGNOSIS — D0511 Intraductal carcinoma in situ of right breast: Secondary | ICD-10-CM

## 2019-05-08 ENCOUNTER — Other Ambulatory Visit: Payer: Self-pay

## 2019-05-08 ENCOUNTER — Ambulatory Visit (HOSPITAL_COMMUNITY)
Admission: RE | Admit: 2019-05-08 | Discharge: 2019-05-08 | Disposition: A | Discharge status: Home / Self Care | Payer: Medicare Other | Source: Ambulatory Visit | Attending: Internal Medicine | Admitting: Internal Medicine

## 2019-05-08 ENCOUNTER — Inpatient Hospital Stay (HOSPITAL_COMMUNITY): Payer: Medicare Other | Attending: Hematology

## 2019-05-08 DIAGNOSIS — Z1231 Encounter for screening mammogram for malignant neoplasm of breast: Secondary | ICD-10-CM | POA: Diagnosis not present

## 2019-05-08 DIAGNOSIS — Z801 Family history of malignant neoplasm of trachea, bronchus and lung: Secondary | ICD-10-CM | POA: Insufficient documentation

## 2019-05-08 DIAGNOSIS — D509 Iron deficiency anemia, unspecified: Secondary | ICD-10-CM | POA: Insufficient documentation

## 2019-05-08 DIAGNOSIS — Z8052 Family history of malignant neoplasm of bladder: Secondary | ICD-10-CM | POA: Diagnosis not present

## 2019-05-08 DIAGNOSIS — E785 Hyperlipidemia, unspecified: Secondary | ICD-10-CM | POA: Insufficient documentation

## 2019-05-08 DIAGNOSIS — I10 Essential (primary) hypertension: Secondary | ICD-10-CM | POA: Insufficient documentation

## 2019-05-08 DIAGNOSIS — Z86000 Personal history of in-situ neoplasm of breast: Secondary | ICD-10-CM | POA: Diagnosis present

## 2019-05-08 DIAGNOSIS — Z79899 Other long term (current) drug therapy: Secondary | ICD-10-CM | POA: Diagnosis not present

## 2019-05-08 DIAGNOSIS — K219 Gastro-esophageal reflux disease without esophagitis: Secondary | ICD-10-CM | POA: Insufficient documentation

## 2019-05-08 DIAGNOSIS — E039 Hypothyroidism, unspecified: Secondary | ICD-10-CM | POA: Diagnosis not present

## 2019-05-08 DIAGNOSIS — F419 Anxiety disorder, unspecified: Secondary | ICD-10-CM | POA: Insufficient documentation

## 2019-05-08 DIAGNOSIS — D0511 Intraductal carcinoma in situ of right breast: Secondary | ICD-10-CM

## 2019-05-08 DIAGNOSIS — Z8042 Family history of malignant neoplasm of prostate: Secondary | ICD-10-CM | POA: Diagnosis not present

## 2019-05-08 DIAGNOSIS — R790 Abnormal level of blood mineral: Secondary | ICD-10-CM

## 2019-05-08 LAB — COMPREHENSIVE METABOLIC PANEL
ALT: 15 U/L (ref 0–44)
AST: 15 U/L (ref 15–41)
Albumin: 3.8 g/dL (ref 3.5–5.0)
Alkaline Phosphatase: 55 U/L (ref 38–126)
Anion gap: 7 (ref 5–15)
BUN: 10 mg/dL (ref 8–23)
CO2: 26 mmol/L (ref 22–32)
Calcium: 8.6 mg/dL — ABNORMAL LOW (ref 8.9–10.3)
Chloride: 105 mmol/L (ref 98–111)
Creatinine, Ser: 0.68 mg/dL (ref 0.44–1.00)
GFR calc Af Amer: >60 mL/min (ref >60)
GFR calc non Af Amer: >60 mL/min (ref >60)
Glucose, Bld: 104 mg/dL — ABNORMAL HIGH (ref 70–99)
Potassium: 4 mmol/L (ref 3.5–5.1)
Sodium: 138 mmol/L (ref 135–145)
Total Bilirubin: 0.7 mg/dL (ref 0.3–1.2)
Total Protein: 6.6 g/dL (ref 6.5–8.1)

## 2019-05-08 LAB — CBC WITH DIFFERENTIAL/PLATELET
Abs Immature Granulocytes: 0.03 10*3/uL (ref 0.00–0.07)
Basophils Absolute: 0 10*3/uL (ref 0.0–0.1)
Basophils Relative: 0 %
Eosinophils Absolute: 0.1 10*3/uL (ref 0.0–0.5)
Eosinophils Relative: 2 %
HCT: 38.9 % (ref 36.0–46.0)
Hemoglobin: 12.4 g/dL (ref 12.0–15.0)
Immature Granulocytes: 1 %
Lymphocytes Relative: 29 %
Lymphs Abs: 1.7 10*3/uL (ref 0.7–4.0)
MCH: 28.2 pg (ref 26.0–34.0)
MCHC: 31.9 g/dL (ref 30.0–36.0)
MCV: 88.4 fL (ref 80.0–100.0)
Monocytes Absolute: 0.4 10*3/uL (ref 0.1–1.0)
Monocytes Relative: 8 %
Neutro Abs: 3.5 10*3/uL (ref 1.7–7.7)
Neutrophils Relative %: 60 %
Platelets: 173 10*3/uL (ref 150–400)
RBC: 4.4 MIL/uL (ref 3.87–5.11)
RDW: 13.3 % (ref 11.5–15.5)
WBC: 5.8 10*3/uL (ref 4.0–10.5)
nRBC: 0 % (ref 0.0–0.2)

## 2019-05-08 LAB — FERRITIN: Ferritin: 38 ng/mL (ref 11–307)

## 2019-05-08 LAB — LACTATE DEHYDROGENASE: LDH: 186 U/L (ref 98–192)

## 2019-05-15 ENCOUNTER — Ambulatory Visit (HOSPITAL_COMMUNITY): Payer: Medicare Other | Admitting: Hematology

## 2019-05-19 ENCOUNTER — Other Ambulatory Visit: Payer: Self-pay

## 2019-05-19 ENCOUNTER — Inpatient Hospital Stay (HOSPITAL_BASED_OUTPATIENT_CLINIC_OR_DEPARTMENT_OTHER): Payer: Medicare Other | Admitting: Hematology

## 2019-05-19 ENCOUNTER — Encounter (HOSPITAL_COMMUNITY): Payer: Self-pay | Admitting: Hematology

## 2019-05-19 VITALS — BP 183/65 | HR 59 | Temp 98.0°F | Resp 18 | Wt 154.0 lb

## 2019-05-19 DIAGNOSIS — Z86000 Personal history of in-situ neoplasm of breast: Secondary | ICD-10-CM | POA: Diagnosis not present

## 2019-05-19 DIAGNOSIS — D0511 Intraductal carcinoma in situ of right breast: Secondary | ICD-10-CM

## 2019-05-19 NOTE — Patient Instructions (Signed)
[place] at [facility] Discharge Instructions  You were seen today by Dr. Delton Coombes. He went over your recent lab and test  results. He will see you back in 1 year for labs and follow up.   Thank you for choosing [place] at [facility] to provide your oncology and hematology care.  To afford each patient quality time with our provider, please arrive at least 15 minutes before your scheduled appointment time.   If you have a lab appointment with the [facility] please come in thru the  Main Entrance and check in at the main information desk  You need to re-schedule your appointment should you arrive 10 or more minutes late.  We strive to give you quality time with our providers, and arriving late affects you and other patients whose appointments are after yours.  Also, if you no show three or more times for appointments you may be dismissed from the clinic at the providers discretion.     Again, thank you for choosing [facility].  Our hope is that these requests will decrease the amount of time that you wait before being seen by our physicians.       _____________________________________________________________  Should you have questions after your visit to [facility], please contact our office at [phone] between the hours of 8:00 a.m. and 4:30 p.m.  Voicemails left after 4:00 p.m. will not be returned until the following business day.  For prescription refill requests, have your pharmacy contact our office and allow 72 hours.    Cancer Center Support Programs:   > Cancer Support Group  2nd Tuesday of the month 1pm-2pm, Journey Room

## 2019-05-19 NOTE — Assessment & Plan Note (Signed)
1.  Right breast DCIS: -Status post right lumpectomy in 2013 followed by XRT. -She completed tamoxifen in January 2019. - Denies any lumps in her breast.  No new onset pains. - Mammogram on 05/08/2019 shows BI-RADS Category 1. - Physical exam today did not reveal any palpable masses.  Right breast upper outer quadrant lumpectomy scar is within normal limits.  We reviewed her blood work which is within normal limits.  She will be seen back in 1 year for follow-up.  2.  Iron deficiency state: -Received Injectafer on 11/01/2017. - Ferritin on 05/08/2019 was 36.  Hemoglobin was 12.4.

## 2019-05-19 NOTE — Progress Notes (Signed)
[place], [place] [address]   CLINIC:  Medical Oncology/Hematology  PCP:  Octavio Graves, DO (Inactive) [place] [address] [phone]   REASON FOR VISIT:  Follow-up for right breast DCIS.    INTERVAL HISTORY:  Angelica Grimes 84 y.o. female seen for follow-up of right breast DCIS.  She denies any lumps in her breast.  She denies any new onset pains.  Denies any fevers, night sweats or weight loss.  Appetite is 100%.  Energy levels are 75%.  She had mammogram done on 05/08/2019.  Denies any recent hospitalizations.  She does have trouble walking and requests handicapped tag.  Urinary incontinence is stable.    REVIEW OF SYSTEMS:  Review of Systems  Cardiovascular: Positive for leg swelling.  Genitourinary: Positive for bladder incontinence.   All other systems reviewed and are negative.    PAST MEDICAL/SURGICAL HISTORY:  Past Medical History:  Diagnosis Date  . Anxiety   . Cancer ([place])    breast  . Cancer ([place])    thyroid  . Fracture ankle fracture 01/24/16  . GERD (gastroesophageal reflux disease)   . Hyperlipidemia   . Hypertension   . Hypothyroidism   . IBS (irritable bowel syndrome)    Past Surgical History:  Procedure Laterality Date  . breast cyst surgery      x 2 - benign   . BREAST LUMPECTOMY     right breast with re-excision  . [place]   biopsy  . BUNIONECTOMY  2012   right foot  . COLONOSCOPY    . CYSTECTOMY  1983  . THYROIDECTOMY  1991   due to cancer  . TONSILLECTOMY       SOCIAL HISTORY:  Social History   Socioeconomic History  . Marital status: Widowed    Spouse name: Not on file  . Number of children: Not on file  . Years of education: Not on file  . Highest education level: Not on file  Occupational History  . Not on file  Social Needs  . Financial resource strain: Not on file  . Food insecurity    Worry: Not on file    Inability: Not on file  .  Transportation needs    Medical: Not on file    Non-medical: Not on file  Tobacco Use  . Smoking status: Never Smoker  . Smokeless tobacco: Never Used  Substance and Sexual Activity  . Alcohol use: Yes    Comment: rare  . Drug use: No  . Sexual activity: Not on file  Lifestyle  . Physical activity    Days per week: Not on file    Minutes per session: Not on file  . Stress: Not on file  Relationships  . Social Herbalist on phone: Not on file    Gets together: Not on file    Attends religious service: Not on file    Active member of club or organization: Not on file    Attends meetings of clubs or organizations: Not on file    Relationship status: Not on file  . Intimate partner violence    Fear of current or ex partner: Not on file    Emotionally abused: Not on file    Physically abused: Not on file    Forced sexual activity: Not on file  Other Topics Concern  . Not on file  Social History Narrative  . Not  on file    FAMILY HISTORY:  Family History  Problem Relation Age of Onset  . Stroke Mother   . Pneumonia Mother   . Cancer Father        bladder, prostate  . Cancer Sister        kidney  . Cancer Brother        lung  . Cancer Sister        kidney  . Cancer Sister        lung  . Colon cancer Neg Hx     CURRENT MEDICATIONS:  Outpatient Encounter Medications as of 05/19/2019  Medication Sig Note  . ALPRAZolam (XANAX) 0.25 MG tablet Take 0.25 mg by mouth as needed. anxiety   . CALCIUM-MAGNESIUM-ZINC PO Take 2 tablets by mouth daily.   . carvedilol (COREG) 25 MG tablet Take 25 mg by mouth 2 (two) times daily. as directed   . co-enzyme Q-10 50 MG capsule Take 100 mg by mouth daily.   . Glucosamine-Chondroitin (OSTEO BI-FLEX REGULAR STRENGTH PO) Take 2 tablets by mouth daily.   Marland Kitchen omeprazole (PRILOSEC) 20 MG capsule Take 20 mg by mouth daily.    . potassium chloride SA (K-DUR,KLOR-CON) 20 MEQ tablet Take 20 mEq by mouth daily.    Marland Kitchen SYNTHROID 88 MCG  tablet TAKE 1 TABLET BY MOUTH EVERY DAY   . triamcinolone cream (KENALOG) 0.1 % APPLY TO THE AFFECTED AREA(S) that itch TWICE DAILY   . hydrochlorothiazide (HYDRODIURIL) 25 MG tablet Take 25 mg by mouth daily as needed.  05/19/2019: Stopped taking on her own due to increased urinary incontinence  . hydrochlorothiazide (HYDRODIURIL) 25 MG tablet Take 1 tablet (25 mg total) by mouth 2 (two) times daily. (Patient not taking: Reported on 05/19/2019)   . rosuvastatin (CRESTOR) 10 MG tablet Take 10 mg by mouth daily. 05/19/2019: Dr. Melina Copa discontinued this medication   No facility-administered encounter medications on file as of 05/19/2019.     ALLERGIES:  Allergies  Allergen Reactions  . Shrimp [Shellfish Allergy] Hives    Shrimp only  . Aspirin Nausea Only and Other (See Comments)    Tightness of chest  . Benazepril Swelling    Entire face, lips, throat  . Penicillins Hives, Itching and Rash    Abdominal area only  . Clonidine Derivatives Other (See Comments)    Dryness of mouth, lowers blood pressure and pulse   . Orange Fruit [Citrus] Rash    Fever blisters and blisters on knuckles      PHYSICAL EXAM:  ECOG Performance status: 1  Vitals:   05/19/19 1435  BP: (!) 183/65  Pulse: (!) 59  Resp: 18  Temp: 98 F (36.7 C)  SpO2: 99%   Filed Weights   05/19/19 1435  Weight: 154 lb (69.9 kg)    Physical Exam Vitals signs reviewed.  Constitutional:      Appearance: Normal appearance.  Cardiovascular:     Rate and Rhythm: Normal rate and regular rhythm.     Heart sounds: Normal heart sounds.  Pulmonary:     Effort: Pulmonary effort is normal.     Breath sounds: Normal breath sounds.  Abdominal:     General: There is no distension.     Palpations: Abdomen is soft. There is no mass.  Skin:    General: Skin is warm.  Neurological:     General: No focal deficit present.     Mental Status: She is alert and oriented to person, place, and time.  Psychiatric:  Mood and  Affect: Mood normal.        Behavior: Behavior normal.      LABORATORY DATA:  I have reviewed the labs as listed.  CBC    Component Value Date/Time   WBC 5.8 05/08/2019 1040   RBC 4.40 05/08/2019 1040   HGB 12.4 05/08/2019 1040   HCT 38.9 05/08/2019 1040   PLT 173 05/08/2019 1040   MCV 88.4 05/08/2019 1040   MCH 28.2 05/08/2019 1040   MCHC 31.9 05/08/2019 1040   RDW 13.3 05/08/2019 1040   LYMPHSABS 1.7 05/08/2019 1040   MONOABS 0.4 05/08/2019 1040   EOSABS 0.1 05/08/2019 1040   BASOSABS 0.0 05/08/2019 1040   CMP Latest Ref Rng & Units 05/08/2019 04/10/2019 10/14/2018  Glucose 70 - 99 mg/dL 104(H) - 116(H)  BUN 8 - 23 mg/dL 10 10 16   Creatinine 0.44 - 1.00 mg/dL 0.68 0.72 0.80  Sodium 135 - 145 mmol/L 138 - 137  Potassium 3.5 - 5.1 mmol/L 4.0 - 3.3(L)  Chloride 98 - 111 mmol/L 105 - 106  CO2 22 - 32 mmol/L 26 - 25  Calcium 8.9 - 10.3 mg/dL 8.6(L) - 8.5(L)  Total Protein 6.5 - 8.1 g/dL 6.6 - -  Total Bilirubin 0.3 - 1.2 mg/dL 0.7 - -  Alkaline Phos 38 - 126 U/L 55 - -  AST 15 - 41 U/L 15 - -  ALT 0 - 44 U/L 15 - -       DIAGNOSTIC IMAGING:  I have independently reviewed the scans and discussed with the patient.   I have reviewed Venita Lick LPN's note and agree with the documentation.  I personally performed a face-to-face visit, made revisions and my assessment and plan is as follows.    ASSESSMENT & PLAN:   Ductal carcinoma in situ (DCIS) of right breast 1.  Right breast DCIS: -Status post right lumpectomy in 2013 followed by XRT. -She completed tamoxifen in January 2019. - Denies any lumps in her breast.  No new onset pains. - Mammogram on 05/08/2019 shows BI-RADS Category 1. - Physical exam today did not reveal any palpable masses.  Right breast upper outer quadrant lumpectomy scar is within normal limits.  We reviewed her blood work which is within normal limits.  She will be seen back in 1 year for follow-up.  2.  Iron deficiency state: -Received  Injectafer on 11/01/2017. - Ferritin on 05/08/2019 was 36.  Hemoglobin was 12.4.      Orders placed this encounter:  Orders Placed This Encounter  Procedures  . CBC with Differential/Platelet  . Comprehensive metabolic panel  . Iron and TIBC  . Ferritin  . Vitamin D 25 hydroxy      Derek Jack, MD [place] [phone]

## 2019-06-05 ENCOUNTER — Encounter: Payer: Self-pay | Admitting: Cardiology

## 2019-06-05 ENCOUNTER — Encounter: Payer: Self-pay | Admitting: *Deleted

## 2019-06-05 ENCOUNTER — Ambulatory Visit (INDEPENDENT_AMBULATORY_CARE_PROVIDER_SITE_OTHER): Payer: Medicare Other | Admitting: Cardiology

## 2019-06-05 ENCOUNTER — Other Ambulatory Visit: Payer: Self-pay

## 2019-06-05 DIAGNOSIS — I10 Essential (primary) hypertension: Secondary | ICD-10-CM | POA: Diagnosis not present

## 2019-06-05 MED ORDER — AMLODIPINE BESYLATE 5 MG PO TABS: 5.0000 mg | ORAL_TABLET | Freq: Every day | ORAL | 1 refills | Status: DC

## 2019-06-05 NOTE — Patient Instructions (Addendum)
You have been referred to [facility] has recommended you make the following change in your medication:   START AMLODIPINE 5 MG DAILY   STOP POTASSIUM  Your physician has requested that you regularly monitor and record your blood pressure FOR 1 WEEK AND CALL us WITH READINGS  Please use the same machine at the same time of day to check your readings  Thank you for choosing [facility]!!

## 2019-06-05 NOTE — Addendum Note (Signed)
Addended by: Julian Hy T on: 06/05/2019 09:20 AM   Modules accepted: Orders

## 2019-06-05 NOTE — Progress Notes (Signed)
Clinical Summary Ms. Reissig is a 84 y.o.female seen as new consult, referred by Dr Melina Copa for HTN.    1. HTN - long history of HTN, but previously had been well controlled she reports - over last few months higher and more difficult to control bp's. - compliant with meds  - checks home bp regularly. Typically 160s/80s - limiting sodium intake, no significant NSAID use. No prior history of sleep apnea.  - incontinent with HCTZ, she stopped taking just a few months ago on her own. She stopped losartan since January for a reported side effect she does not remember - essentially now she is just on coreg    Past Medical History:  Diagnosis Date  . Anxiety   . Cancer ([place])    breast  . Cancer ([place])    thyroid  . Fracture ankle fracture 01/24/16  . GERD (gastroesophageal reflux disease)   . Hyperlipidemia   . Hypertension   . Hypothyroidism   . IBS (irritable bowel syndrome)      Allergies  Allergen Reactions  . Shrimp [Shellfish Allergy] Hives    Shrimp only  . Aspirin Nausea Only and Other (See Comments)    Tightness of chest  . Benazepril Swelling    Entire face, lips, throat  . Penicillins Hives, Itching and Rash    Abdominal area only  . Clonidine Derivatives Other (See Comments)    Dryness of mouth, lowers blood pressure and pulse   . Orange Fruit [Citrus] Rash    Fever blisters and blisters on knuckles      Current Outpatient Medications  Medication Sig Dispense Refill  . ALPRAZolam (XANAX) 0.25 MG tablet Take 0.25 mg by mouth as needed. anxiety    . CALCIUM-MAGNESIUM-ZINC PO Take 2 tablets by mouth daily.    . carvedilol (COREG) 25 MG tablet Take 25 mg by mouth 2 (two) times daily. as directed  4  . co-enzyme Q-10 50 MG capsule Take 100 mg by mouth daily.    . Glucosamine-Chondroitin (OSTEO BI-FLEX REGULAR STRENGTH PO) Take 2 tablets by mouth daily.    . hydrochlorothiazide (HYDRODIURIL) 25 MG tablet Take 25 mg by mouth daily as needed.     .  hydrochlorothiazide (HYDRODIURIL) 25 MG tablet Take 1 tablet (25 mg total) by mouth 2 (two) times daily. (Patient not taking: Reported on 05/19/2019) 30 tablet 1  . omeprazole (PRILOSEC) 20 MG capsule Take 20 mg by mouth daily.     . potassium chloride SA (K-DUR,KLOR-CON) 20 MEQ tablet Take 20 mEq by mouth daily.     . rosuvastatin (CRESTOR) 10 MG tablet Take 10 mg by mouth daily.    Marland Kitchen SYNTHROID 88 MCG tablet TAKE 1 TABLET BY MOUTH EVERY DAY 90 tablet 1  . triamcinolone cream (KENALOG) 0.1 % APPLY TO THE AFFECTED AREA(S) that itch TWICE DAILY     No current facility-administered medications for this visit.      Past Surgical History:  Procedure Laterality Date  . breast cyst surgery      x 2 - benign   . BREAST LUMPECTOMY     right breast with re-excision  . [place]   biopsy  . BUNIONECTOMY  2012   right foot  . COLONOSCOPY    . CYSTECTOMY  1983  . THYROIDECTOMY  1991   due to cancer  . TONSILLECTOMY       Allergies  Allergen Reactions  . Shrimp [Shellfish Allergy] Hives  Shrimp only  . Aspirin Nausea Only and Other (See Comments)    Tightness of chest  . Benazepril Swelling    Entire face, lips, throat  . Penicillins Hives, Itching and Rash    Abdominal area only  . Clonidine Derivatives Other (See Comments)    Dryness of mouth, lowers blood pressure and pulse   . Orange Fruit [Citrus] Rash    Fever blisters and blisters on knuckles       Family History  Problem Relation Age of Onset  . Stroke Mother   . Pneumonia Mother   . Cancer Father        bladder, prostate  . Cancer Sister        kidney  . Cancer Brother        lung  . Cancer Sister        kidney  . Cancer Sister        lung  . Colon cancer Neg Hx      Social History Ms. Giangrande reports that she has never smoked. She has never used smokeless tobacco. Ms. Salentine reports current alcohol use.   Review of Systems CONSTITUTIONAL: No weight loss, fever, chills, weakness  or fatigue.  HEENT: Eyes: No visual loss, blurred vision, double vision or yellow sclerae.No hearing loss, sneezing, congestion, runny nose or sore throat.  SKIN: No rash or itching.  CARDIOVASCULAR: per hpi RESPIRATORY: No shortness of breath, cough or sputum.  GASTROINTESTINAL: No anorexia, nausea, vomiting or diarrhea. No abdominal pain or blood.  GENITOURINARY: No burning on urination, no polyuria NEUROLOGICAL: No headache, dizziness, syncope, paralysis, ataxia, numbness or tingling in the extremities. No change in bowel or bladder control.  MUSCULOSKELETAL: No muscle, back pain, joint pain or stiffness.  LYMPHATICS: No enlarged nodes. No history of splenectomy.  PSYCHIATRIC: No history of depression or anxiety.  ENDOCRINOLOGIC: No reports of sweating, cold or heat intolerance. No polyuria or polydipsia.  Marland Kitchen   Physical Examination Today's Vitals   06/05/19 0837  BP: (!) 180/81  Pulse: 61  SpO2: 97%  Weight: 154 lb 12.8 oz (70.2 kg)  Height: 5\' 5"  (1.651 m)   Body mass index is 25.76 kg/m.  Gen: resting comfortably, no acute distress HEENT: no scleral icterus, pupils equal round and reactive, no palptable cervical adenopathy,  CV: RRR, no m/r/,g, no jvd Resp: Clear to auscultation bilaterally GI: abdomen is soft, non-tender, non-distended, normal bowel sounds, no hepatosplenomegaly MSK: extremities are warm, no edema.  Skin: warm, no rash Neuro:  no focal deficits Psych: appropriate affect    Assessment and Plan  1. HTN - more difficult to control over the last few months. At this time appears more to be related to difficulties tolerating her bp regimen as opposed to a form of resistsant HTN. Currently just on coreg for bp - incontinence of HCTZ. Unknown side effect on ARB she stopped taking. Listed allergy to benazepril with angioedema. Allergy to clonidine. - would start norvasc 5mg  daily, call in 1 week with bp's. Titrate norvasc further as needed, next agent would  consider aldactone. If on 3-4 agents over time and ongoing HTN would consider workup for primary HTN at that time - stop KCl since she stopped HCTZ on her own.   EKG today show SR, no acute ischemic changes.       Arnoldo Lenis, M.D.

## 2019-06-12 ENCOUNTER — Telehealth: Payer: Self-pay | Admitting: Cardiology

## 2019-06-12 MED ORDER — AMLODIPINE BESYLATE 10 MG PO TABS: 10.0000 mg | ORAL_TABLET | Freq: Every day | ORAL | 3 refills | Status: DC

## 2019-06-12 NOTE — Telephone Encounter (Signed)
Pt made aware, sent in new RX for increased dose amlodipine. Pt voiced understanding.

## 2019-06-12 NOTE — Telephone Encounter (Signed)
Calling to give BP readings    

## 2019-06-12 NOTE — Telephone Encounter (Signed)
BP looks better but at times still above goal of <130/80. Increase norvasc to 10mg  daily, f/u 4 months   Zandra Abts MD

## 2019-06-12 NOTE — Telephone Encounter (Signed)
Called pt. No answer, left message for pt to return call.  

## 2019-06-12 NOTE — Telephone Encounter (Signed)
Pt states readings are as follows: 127/63  74 135/75  75 157/84  59 144/85  71  137/79  63 124/72  65 137/80  66

## 2019-07-01 ENCOUNTER — Telehealth: Payer: Self-pay | Admitting: Cardiology

## 2019-07-01 NOTE — Telephone Encounter (Signed)
Pt says for the last 4-5 days has increased swelling in both legs - denies SOB/weight gain - recent increase of amlodipine 10 mg daily - BP today 118/69 HR 70 - says has been running WNL - thinks increase of amlodipine may be contributing to leg swelling

## 2019-07-01 NOTE — Telephone Encounter (Signed)
Pt voiced understanding - will update [place] in 1 week

## 2019-07-01 NOTE — Telephone Encounter (Signed)
Can go back to 5mg  daily of the norvasc, update [place] on swelling in 1 week   Zandra Abts MD

## 2019-07-01 NOTE — Telephone Encounter (Signed)
Ankles are swelling and wanting to know if this a side effect.

## 2019-07-08 ENCOUNTER — Other Ambulatory Visit: Payer: Self-pay

## 2019-07-08 DIAGNOSIS — Z20822 Contact with and (suspected) exposure to covid-19: Secondary | ICD-10-CM

## 2019-07-10 LAB — NOVEL CORONAVIRUS, NAA: SARS-CoV-2, NAA: NOT DETECTED

## 2019-08-06 ENCOUNTER — Ambulatory Visit (INDEPENDENT_AMBULATORY_CARE_PROVIDER_SITE_OTHER): Payer: Medicare Other | Admitting: Family Medicine

## 2019-08-06 ENCOUNTER — Encounter: Payer: Self-pay | Admitting: Family Medicine

## 2019-08-06 ENCOUNTER — Other Ambulatory Visit: Payer: Self-pay

## 2019-08-06 VITALS — BP 122/74 | Temp 97.2°F | Resp 15 | Ht 64.5 in | Wt 152.1 lb

## 2019-08-06 DIAGNOSIS — R32 Unspecified urinary incontinence: Secondary | ICD-10-CM | POA: Diagnosis not present

## 2019-08-06 DIAGNOSIS — E785 Hyperlipidemia, unspecified: Secondary | ICD-10-CM

## 2019-08-06 DIAGNOSIS — I10 Essential (primary) hypertension: Secondary | ICD-10-CM | POA: Diagnosis not present

## 2019-08-06 DIAGNOSIS — E890 Postprocedural hypothyroidism: Secondary | ICD-10-CM

## 2019-08-06 NOTE — Patient Instructions (Addendum)
  I appreciate the opportunity to provide you with the care for your health and wellness. Today we discussed: establish care  Follow up: July annual with no pap vision check    Labs ordered today at [facility].  It was a pleasure to meet you. I hope you have a happy, safe, and healthy Holiday season.  I will see you in the New Year.  I have attached some Kegel exercises that might help with bladder control.   Please continue to practice social distancing to keep you, your family, and our community safe.  If you must go out, please wear a mask and practice good handwashing.  It was a pleasure to see you and I look forward to continuing to work together on your health and well-being. Please do not hesitate to call the office if you need care or have questions about your care.  Have a wonderful day and week. With Gratitude, Cherly Beach, DNP, AGNP-BC     Kegel Exercises  Kegel exercises can help strengthen your pelvic floor muscles. The pelvic floor is a group of muscles that support your rectum, small intestine, and bladder. In females, pelvic floor muscles also help support the womb (uterus). These muscles help you control the flow of urine and stool. Kegel exercises are painless and simple, and they do not require any equipment. Your provider may suggest Kegel exercises to:  Improve bladder and bowel control.  Improve sexual response.  Improve weak pelvic floor muscles after surgery to remove the uterus (hysterectomy) or pregnancy (females).  Improve weak pelvic floor muscles after prostate gland removal or surgery (males). Kegel exercises involve squeezing your pelvic floor muscles, which are the same muscles you squeeze when you try to stop the flow of urine or keep from passing gas. The exercises can be done while sitting, standing, or lying down, but it is best to vary your position. Exercises How to do Kegel exercises: 1. Squeeze your pelvic floor muscles tight. You should  feel a tight lift in your rectal area. If you are a female, you should also feel a tightness in your vaginal area. Keep your stomach, buttocks, and legs relaxed. 2. Hold the muscles tight for up to 10 seconds. 3. Breathe normally. 4. Relax your muscles. 5. Repeat as told by your health care provider. Repeat this exercise daily as told by your health care provider. Continue to do this exercise for at least 4-6 weeks, or for as long as told by your health care provider. You may be referred to a physical therapist who can help you learn more about how to do Kegel exercises. Depending on your condition, your health care provider may recommend:  Varying how long you squeeze your muscles.  Doing several sets of exercises every day.  Doing exercises for several weeks.  Making Kegel exercises a part of your regular exercise routine. This information is not intended to replace advice given to you by your health care provider. Make sure you discuss any questions you have with your health care provider. Document Released: 09/04/2012 Document Revised: 05/08/2018 Document Reviewed: 05/08/2018 Elsevier Patient Education  2020 [employer].

## 2019-08-06 NOTE — Progress Notes (Signed)
Subjective:     Patient ID: Angelica Grimes, female   DOB: [birth year], 84 y.o.   MRN: [id number]  Angelica Grimes presents for Establish Care (PCP retired and needs a new provider. No concerns today )  Angelica Grimes is an 84 year old female patient who is here to establish care secondary to her provider retiring.  She has a history of irritable bowel syndrome, hypothyroidism, hypertension, hyperlipidemia, GERD, breast and thyroid cancer, benign brain tumor, anxiety.  Today she reports she has no concerns she just wants to get in with a provider so that she can get her medication refills as needed.  She is followed by neurosurgery secondary to the tumor that is benign found earlier this year.  She is also followed by oncology for ductal carcinoma of the breast.  She is followed by cardiology for hypertension.  Previously she was being followed by Dr. Dorris Fetch secondary to postsurgical hypothyroidism.  Reports taking all her medications without issue.  Does not need any refills today.  She denies any tobacco use, occasional glass of wine for special occasions.  Denies drug use.  Not currently sexually active.  Widowed.  Has grandson living with her currently.  Has 2 children who are close by in [place] and [place].  No pets in the home.  Reports she eats what she wants.  She does not really drink caffeine but will have a Coke occasionally.  Especially if she is eating a hot dog.  Drinks 3-4 bottles of water daily.  Wears her seatbelt does not really need some protection because she reports she does not go out much.  Does have smoke detectors in a security system at home.  Does not use her phone when her car.  Today patient denies signs and symptoms of COVID 19 infection including fever, chills, cough, shortness of breath, and headache.  Past Medical, Surgical, Social History, Allergies, and Medications have been Reviewed. Past Medical History:  Diagnosis Date  . Anxiety   . Brain tumor (benign)  ([place])   . Cancer ([place])    breast  . Cancer ([place])    thyroid  . Fracture ankle fracture 01/24/16  . GERD (gastroesophageal reflux disease)   . Hyperlipidemia   . Hypertension   . Hypothyroidism   . IBS (irritable bowel syndrome)    Past Surgical History:  Procedure Laterality Date  . breast cyst surgery      x 2 - benign   . BREAST LUMPECTOMY     right breast with re-excision  . [place]   biopsy  . BUNIONECTOMY  2012   right foot  . COLONOSCOPY    . CYSTECTOMY  1983  . THYROIDECTOMY  1991   due to cancer  . TONSILLECTOMY     Social History   Socioeconomic History  . Marital status: Widowed    Spouse name: Not on file  . Number of children: 2  . Years of education: 58  . Highest education level: Not on file  Occupational History  . Not on file  Social Needs  . Financial resource strain: Not hard at all  . Food insecurity    Worry: Never true    Inability: Never true  . Transportation needs    Medical: No    Non-medical: No  Tobacco Use  . Smoking status: Never Smoker  . Smokeless tobacco: Never Used  Substance and Sexual Activity  . Alcohol use: Yes    Comment: rare  .  Drug use: No  . Sexual activity: Not Currently  Lifestyle  . Physical activity    Days per week: 0 days    Minutes per session: 0 min  . Stress: Only a little  Relationships  . Social connections    Talks on phone: More than three times a week    Gets together: More than three times a week    Attends religious service: Never    Active member of club or organization: No    Attends meetings of clubs or organizations: Never    Relationship status: Widowed  . Intimate partner violence    Fear of current or ex partner: No    Emotionally abused: No    Physically abused: No    Forced sexual activity: No  Other Topics Concern  . Not on file  Social History Narrative   Lives with grandson      Widow   2 children   1: [place] Daughter-Letitia (has permission to get  info) 2 children: 1 son and daughter (son lives with her)   2: [place] Son-[name] 1 son      No pets      Diet: eats what she wants   Caffeine: none,  Maybe a coke at times   Water: 3-4 bottles of 16 oz      Wear seat belt   Wear sun protection if needed   Smoke detectors and Security system    Does not use phone in car          Outpatient Encounter Medications as of 08/06/2019  Medication Sig  . ALPRAZolam (XANAX) 0.25 MG tablet Take 0.25 mg by mouth as needed. anxiety  . amLODipine (NORVASC) 10 MG tablet Take 1 tablet (10 mg total) by mouth daily.  Marland Kitchen CALCIUM-MAGNESIUM-ZINC PO Take 2 tablets by mouth daily.  . carvedilol (COREG) 25 MG tablet Take 25 mg by mouth 2 (two) times daily. as directed  . co-enzyme Q-10 50 MG capsule Take 100 mg by mouth daily.  . Glucosamine-Chondroitin (OSTEO BI-FLEX REGULAR STRENGTH PO) Take 2 tablets by mouth daily.  Marland Kitchen omeprazole (PRILOSEC) 20 MG capsule Take 20 mg by mouth daily.   Marland Kitchen SYNTHROID 88 MCG tablet TAKE 1 TABLET BY MOUTH EVERY DAY  . FLUAD 0.5 ML SUSY Inject as directed See admin instructions.  . [DISCONTINUED] aspirin EC 81 MG tablet Take 81 mg by mouth daily.  . [DISCONTINUED] potassium chloride SA (KLOR-CON) 20 MEQ tablet TAKE 1 TABLET BY MOUTH DAILY ON saturday, sunday, tuesday AND thursday, THEN ONE TWICE DAILY ON monday, wednesday, AND friday   No facility-administered encounter medications on file as of 08/06/2019.    Allergies  Allergen Reactions  . Shrimp [Shellfish Allergy] Hives    Shrimp only  . Aspirin Nausea Only and Other (See Comments)    Tightness of chest  . Benazepril Swelling    Entire face, lips, throat  . Penicillins Hives, Itching and Rash    Abdominal area only  . Clonidine Derivatives Other (See Comments)    Dryness of mouth, lowers blood pressure and pulse   . Orange Fruit [Citrus] Rash    Fever blisters and blisters on knuckles     Review of Systems  Constitutional: Negative for chills and fever.  HENT:  Negative.   Eyes: Negative.  Negative for visual disturbance.       Glasses  Respiratory: Negative.   Cardiovascular: Positive for leg swelling.       Norvasc related ankle medication  Gastrointestinal: Negative.   Endocrine: Negative.   Genitourinary: Negative.        Depends and pad wears due to incontinence   Musculoskeletal: Negative.   Skin: Negative.   Allergic/Immunologic: Negative.   Neurological: Negative.  Negative for headaches.  Hematological: Negative.   Psychiatric/Behavioral: Negative.   All other systems reviewed and are negative.      Objective:     BP 122/74   Temp (!) 97.2 F (36.2 C) (Temporal)   Resp 15   Ht 5' 4.5" (1.638 m)   Wt 152 lb 1.9 oz (69 kg)   BMI 25.71 kg/m   Physical Exam Vitals signs and nursing note reviewed.  Constitutional:      Appearance: Normal appearance. She is well-developed, well-groomed and overweight.  HENT:     Head: Normocephalic and atraumatic.     Right Ear: External ear normal.     Left Ear: External ear normal.     Nose: Nose normal.     Mouth/Throat:     Mouth: Mucous membranes are moist.     Pharynx: Oropharynx is clear.  Eyes:     General:        Right eye: No discharge.        Left eye: No discharge.     Conjunctiva/sclera: Conjunctivae normal.     Comments: glasses  Neck:     Musculoskeletal: Normal range of motion and neck supple.  Cardiovascular:     Rate and Rhythm: Normal rate and regular rhythm.     Pulses: Normal pulses.     Heart sounds: Normal heart sounds.  Pulmonary:     Effort: Pulmonary effort is normal.     Breath sounds: Normal breath sounds.  Musculoskeletal: Normal range of motion.  Skin:    General: Skin is warm.  Neurological:     General: No focal deficit present.     Mental Status: She is alert and oriented to person, place, and time.  Psychiatric:        Attention and Perception: Attention normal.        Mood and Affect: Mood normal.        Speech: Speech normal.         Behavior: Behavior normal. Behavior is cooperative.        Thought Content: Thought content normal.        Cognition and Memory: Cognition normal.        Judgment: Judgment normal.        Assessment and Plan        1. Essential hypertension Angelica Grimes is encouraged to maintain a well balanced diet that is low in salt. Controlled, continue current medication regimen. Refills provided  Additionally, she is also reminded that exercise is beneficial for heart health and control of  Blood pressure. 30-60 minutes daily is recommended-walking was suggested. Up dated labs ordered  - CBC - COMPLETE METABOLIC PANEL WITH GFR - TSH  2. Postsurgical hypothyroidism Needs up to date TSH. Has been followed by  Dr. Dorris Fetch, not currently being seen with him No S&S of hypothyroidism at this time. Continue current medication level and change if needed based on TSH  - TSH  3. Hyperlipidemia, unspecified hyperlipidemia type Needs up dated labs  - Lipid panel   4. Urinary incontinence, unspecified type Unsure of the type of incontinence she has.  Provided with Kegel exercises and reviewed.  Is wearing a depends today.  Reports that she wears them regularly especially when she  is going out but not as much when she is at home.  Reports that that is why she cannot take fluids now because she will have continuous flow. Reports having this checked in past it was not a bladder issue.   Follow Up: July for annual    Perlie Mayo, DNP, AGNP-BC [place], [address] [place],  [id number] Office Hours: Mon-Thurs 8 am-5 pm; Fri 8 am-12 pm Office Phone:  [phone]  Office Fax: [phone]

## 2019-08-07 LAB — LIPID PANEL
Cholesterol: 284 mg/dL — ABNORMAL HIGH (ref <200)
HDL: 42 mg/dL — ABNORMAL LOW (ref >=50)
LDL Cholesterol (Calc): 210 mg/dL (calc) — ABNORMAL HIGH
Non-HDL Cholesterol (Calc): 242 mg/dL (calc) — ABNORMAL HIGH (ref <130)
Total CHOL/HDL Ratio: 6.8 (calc) — ABNORMAL HIGH (ref <5.0)
Triglycerides: 158 mg/dL — ABNORMAL HIGH (ref <150)

## 2019-08-07 LAB — COMPLETE METABOLIC PANEL WITH GFR
AG Ratio: 1.7 (calc) (ref 1.0–2.5)
ALT: 16 U/L (ref 6–29)
AST: 16 U/L (ref 10–35)
Albumin: 4.1 g/dL (ref 3.6–5.1)
Alkaline phosphatase (APISO): 57 U/L (ref 37–153)
BUN: 15 mg/dL (ref 7–25)
CO2: 31 mmol/L (ref 20–32)
Calcium: 8.9 mg/dL (ref 8.6–10.4)
Chloride: 101 mmol/L (ref 98–110)
Creat: 0.76 mg/dL (ref 0.60–0.88)
GFR, Est African American: 85 mL/min/{1.73_m2} (ref >=60)
GFR, Est Non African American: 73 mL/min/{1.73_m2} (ref >=60)
Globulin: 2.4 g/dL (calc) (ref 1.9–3.7)
Glucose, Bld: 92 mg/dL (ref 65–99)
Potassium: 3.8 mmol/L (ref 3.5–5.3)
Sodium: 141 mmol/L (ref 135–146)
Total Bilirubin: 0.6 mg/dL (ref 0.2–1.2)
Total Protein: 6.5 g/dL (ref 6.1–8.1)

## 2019-08-07 LAB — CBC
HCT: 36.4 % (ref 35.0–45.0)
Hemoglobin: 12.2 g/dL (ref 11.7–15.5)
MCH: 28.6 pg (ref 27.0–33.0)
MCHC: 33.5 g/dL (ref 32.0–36.0)
MCV: 85.4 fL (ref 80.0–100.0)
MPV: 10.2 fL (ref 7.5–12.5)
Platelets: 186 10*3/uL (ref 140–400)
RBC: 4.26 10*6/uL (ref 3.80–5.10)
RDW: 13.6 % (ref 11.0–15.0)
WBC: 5.9 10*3/uL (ref 3.8–10.8)

## 2019-08-07 LAB — TSH: TSH: 0.79 mIU/L (ref 0.40–4.50)

## 2019-08-07 NOTE — Progress Notes (Signed)
Please let Angelica Grimes know that her labs are back.  Blood levels are good. Normal kidney and liver function at this time.  Maintain hydration with water. Thyroid was in range.  Continue current medication dose. Cholesterol levels are rather elevated given that this was a fasting lab.  Would definitely benefit from having a low-dose statin medication.  All levels within the panel were elevated which can be problematic for possible heart disease causing blockages.  Please see if she is willing to start something low-dose.

## 2019-08-08 ENCOUNTER — Other Ambulatory Visit: Payer: Self-pay | Admitting: Family Medicine

## 2019-08-08 DIAGNOSIS — E785 Hyperlipidemia, unspecified: Secondary | ICD-10-CM

## 2019-08-08 MED ORDER — ROSUVASTATIN CALCIUM 10 MG PO TABS: 5.0000 mg | ORAL_TABLET | Freq: Every day | ORAL | 1 refills | Status: DC

## 2019-11-15 ENCOUNTER — Other Ambulatory Visit: Payer: Self-pay | Admitting: Cardiology

## 2019-11-21 ENCOUNTER — Other Ambulatory Visit: Payer: Self-pay | Admitting: Family Medicine

## 2019-11-21 ENCOUNTER — Telehealth: Payer: Self-pay

## 2019-11-21 DIAGNOSIS — E785 Hyperlipidemia, unspecified: Secondary | ICD-10-CM

## 2019-11-21 MED ORDER — ROSUVASTATIN CALCIUM 5 MG PO TABS: 5.0000 mg | ORAL_TABLET | Freq: Every day | ORAL | 1 refills | Status: DC

## 2019-11-21 NOTE — Telephone Encounter (Signed)
Angelica Grimes is calling from [facility] , as the pt wants a 90 day supply of the Generic crestor, and originally 10mg  was called in and she was cutting them in half, can this be changed to 5mg  and 90 days supply

## 2019-12-10 ENCOUNTER — Telehealth: Payer: Medicare Other | Admitting: Cardiology

## 2019-12-17 ENCOUNTER — Encounter: Payer: Self-pay | Admitting: Cardiology

## 2019-12-17 ENCOUNTER — Telehealth (INDEPENDENT_AMBULATORY_CARE_PROVIDER_SITE_OTHER): Payer: Medicare Other | Admitting: Cardiology

## 2019-12-17 VITALS — BP 138/80 | HR 65 | Ht 65.0 in | Wt 154.0 lb

## 2019-12-17 DIAGNOSIS — I10 Essential (primary) hypertension: Secondary | ICD-10-CM | POA: Diagnosis not present

## 2019-12-17 MED ORDER — AMLODIPINE BESYLATE 2.5 MG PO TABS: 2.5000 mg | ORAL_TABLET | Freq: Every day | ORAL | 3 refills | Status: DC

## 2019-12-17 NOTE — Addendum Note (Signed)
Addended by: Julian Hy T on: 12/17/2019 10:25 AM   Modules accepted: Orders

## 2019-12-17 NOTE — Progress Notes (Signed)
Virtual Visit via Telephone Note   This visit type was conducted due to national recommendations for restrictions regarding the COVID-19 Pandemic (e.g. social distancing) in an effort to limit this patient's exposure and mitigate transmission in our community.  Due to her co-morbid illnesses, this patient is at least at moderate risk for complications without adequate follow up.  This format is felt to be most appropriate for this patient at this time.  The patient did not have access to video technology/had technical difficulties with video requiring transitioning to audio format only (telephone).  All issues noted in this document were discussed and addressed.  No physical exam could be performed with this format.  Please refer to the patient's chart for her  consent to telehealth for [facility].   The patient was identified using 2 identifiers.  Date:  12/17/2019   ID:  Angelica Grimes, DOB [birth year], MRN [id number]  Patient Location: Home Provider Location: Office  PCP:  Perlie Mayo, NP  Cardiologist:  Carlyle Dolly, MD  Electrophysiologist:  None   Evaluation Performed:  Follow-Up Visit  Chief Complaint:  Follow up visit  History of Present Illness:    Angelica Grimes is a 85 y.o. female seen today for follow up of the following medical problems.     1. HTN - long history of HTN, but previously had been well controlled she reports - over last few months higher and more difficult to control bp's. - compliant with meds  - checks home bp regularly. Typically 160s/80s - limiting sodium intake, no significant NSAID use. No prior history of sleep apnea.  - incontinent with HCTZ, she stopped taking just a few months ago on her own. She stopped losartan since January for a reported side effect she does not remember. Has ACEI allergy with angioedema. Clonidine caused dry mouth.   - last visit we started norvasc 5mg  daily - she reports some swelling in her feet. On  meds SBP 115-120s/70s in the afternoon.     On waiting list at [facility] for covid vaccine    The patient does not have symptoms concerning for COVID-19 infection (fever, chills, cough, or new shortness of breath).    Past Medical History:  Diagnosis Date  . Anxiety   . Brain tumor (benign) ([place])   . Cancer ([place])    breast  . Cancer ([place])    thyroid  . Fracture ankle fracture 01/24/16  . GERD (gastroesophageal reflux disease)   . Hyperlipidemia   . Hypertension   . Hypothyroidism   . IBS (irritable bowel syndrome)    Past Surgical History:  Procedure Laterality Date  . breast cyst surgery      x 2 - benign   . BREAST LUMPECTOMY     right breast with re-excision  . [place]   biopsy  . BUNIONECTOMY  2012   right foot  . COLONOSCOPY    . CYSTECTOMY  1983  . THYROIDECTOMY  1991   due to cancer  . TONSILLECTOMY       No outpatient medications have been marked as taking for the 12/17/19 encounter (Appointment) with Arnoldo Lenis, MD.     Allergies:   Shrimp [shellfish allergy], Aspirin, Benazepril, Penicillins, Clonidine derivatives, and Orange fruit [citrus]   Social History   Tobacco Use  . Smoking status: Never Smoker  . Smokeless tobacco: Never Used  Substance Use Topics  . Alcohol use: Yes    Comment:  rare  . Drug use: No     Family Hx: The patient's family history includes Atrial fibrillation in her brother, brother, and brother; Cancer in her brother, father, half-sister, half-sister, and sister; Diabetes in her sister; Pneumonia in her father and mother; Sarcoidosis in her sister; Stroke in her mother. There is no history of Colon cancer.  ROS:   Please see the history of present illness.     All other systems reviewed and are negative.   Prior CV studies:   The following studies were reviewed today:   Labs/Other Tests and Data Reviewed:    EKG:  No ECG reviewed.  Recent Labs: 08/06/2019: ALT 16; BUN 15;  Creat 0.76; Hemoglobin 12.2; Platelets 186; Potassium 3.8; Sodium 141; TSH 0.79   Recent Lipid Panel Lab Results  Component Value Date/Time   CHOL 284 (H) 08/06/2019 12:15 PM   TRIG 158 (H) 08/06/2019 12:15 PM   HDL 42 (L) 08/06/2019 12:15 PM   CHOLHDL 6.8 (H) 08/06/2019 12:15 PM   LDLCALC 210 (H) 08/06/2019 12:15 PM    Wt Readings from Last 3 Encounters:  08/06/19 152 lb 1.9 oz (69 kg)  06/05/19 154 lb 12.8 oz (70.2 kg)  05/19/19 154 lb (69.9 kg)     Objective:    Vital Signs:   Today's Vitals   12/17/19 0939  BP: 138/80  Pulse: 65  Weight: 154 lb (69.9 kg)  Height: 5\' 5"  (1.651 m)   Body mass index is 25.63 kg/m. Normal affect. Normal speech pattern and tone. Comfortable, no apparent distress. No audible signs of sob or wheezing.   ASSESSMENT & PLAN:    1. HTN - more difficult to control over the last few months. At this time appears more to be related to difficulties tolerating her bp regimen as opposed to a form of resistsant HTN.  - incontinence of HCTZ. Unknown side effect on ARB she stopped taking. Listed allergy to benazepril with angioedema. Allergy to clonidine.   - some swelling on norvasc. We will lower dose to 2.5mg  daily, she will update [place] in 2 weeks on swelling and bp's. If either is an issue would stop norvasc and start aldactone. Other future options would include hydralazine.   COVID-19 Education: The signs and symptoms of COVID-19 were discussed with the patient and how to seek care for testing (follow up with PCP or arrange E-visit).  The importance of social distancing was discussed today.  Time:   Today, I have spent 21 minutes with the patient with telehealth technology discussing the above problems.     Medication Adjustments/Labs and Tests Ordered: Current medicines are reviewed at length with the patient today.  Concerns regarding medicines are outlined above.   Tests Ordered: No orders of the defined types were placed in this  encounter.   Medication Changes: No orders of the defined types were placed in this encounter.   Follow Up:  Either In Person or Virtual in 3 month(s)  Signed, Carlyle Dolly, MD  12/17/2019 8:57 AM    [place]

## 2019-12-17 NOTE — Patient Instructions (Signed)
Your physician recommends that you schedule a follow-up appointment in: [place] has recommended you make the following change in your medication:   DECREASE AMLODIPINE 2.5 MG DAILY   Your physician has requested that you regularly monitor and record your blood pressure readings at home FOR 2 WEEKS AND CALL us WITH READING AND AN UPDATE ON YOUR SWELLING - CHECK YOUR BLOOD PRESSURE AT THE SAME TIME DAILY AFTER TAKING MEDICATIONS  Thank you for choosing [facility]!!

## 2020-01-06 ENCOUNTER — Telehealth: Payer: Self-pay | Admitting: Cardiology

## 2020-01-06 ENCOUNTER — Encounter: Payer: Self-pay | Admitting: *Deleted

## 2020-01-06 NOTE — Telephone Encounter (Signed)
BP's are a little elevated before meds but after they are well controlled, this is a normal pattern. Would not make any changes   Zandra Abts MD

## 2020-01-06 NOTE — Telephone Encounter (Signed)
3/23 - 153/82  60   - 1 1/2 hour later after taking the Coreg - 125/73  63 3/24 - 143/81  62                                                                     125/69  64 3/25 - 143/78  71                                                                     129/70  61 3/26 - 133/71  60                                                                     117/64  64 3/27 - 155/82  61                                                                     130/72  62 3/28 - 158/92  63                                                                     132/71  65 3/29 - 142/80  62                                                                     124/68  64 3/30 - 143/78  70                                                                     127 70 60 3/31 - 133/70  59  117/65  63 4/1 - 154/80  62                                                                       130/71  60 4/2 - 142/77  69                                                                       127/68  59 4/3 -  149/72  61                                                                      129/69  59 4/4 -  140/77  69                                                                      128/69  60 4/5 -  133/70  62                                                                      117/62  61  

## 2020-01-06 NOTE — Telephone Encounter (Signed)
Patient called to give BP readings

## 2020-01-06 NOTE — Telephone Encounter (Signed)
Left detailed message on voicemail & sent my chart message as well.

## 2020-01-06 NOTE — Telephone Encounter (Signed)
Left message to return call 

## 2020-01-17 IMAGING — MG DIGITAL SCREENING BILATERAL MAMMOGRAM WITH TOMO AND CAD
8 series · 9 of 24 positions shown · non-contrast
Comparison: Previous exam(s).

CLINICAL DATA: Screening.

EXAM:
DIGITAL SCREENING BILATERAL MAMMOGRAM WITH TOMO AND CAD

[L MLO synth-2D]
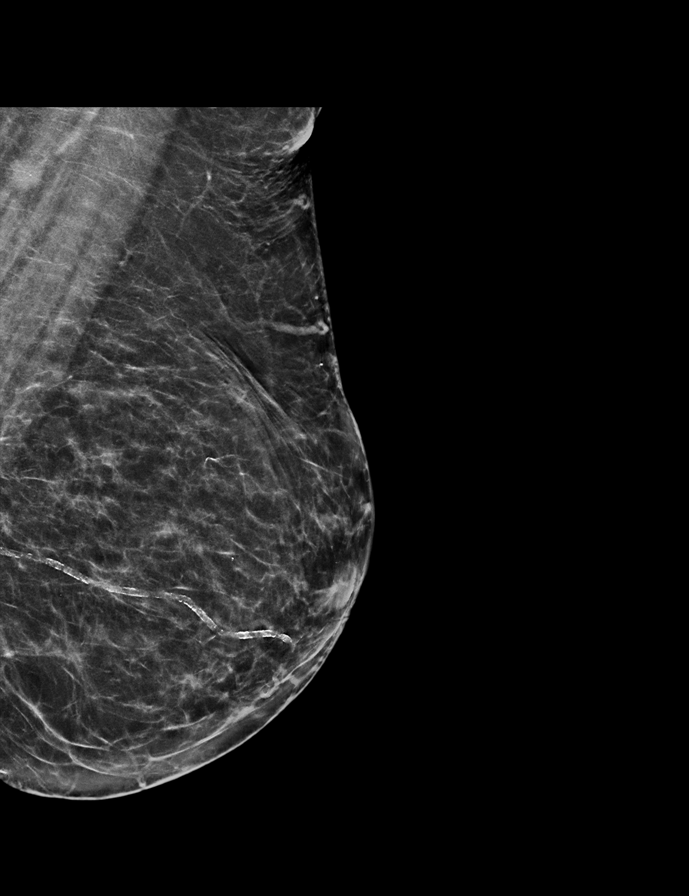

[R MLO synth-2D]
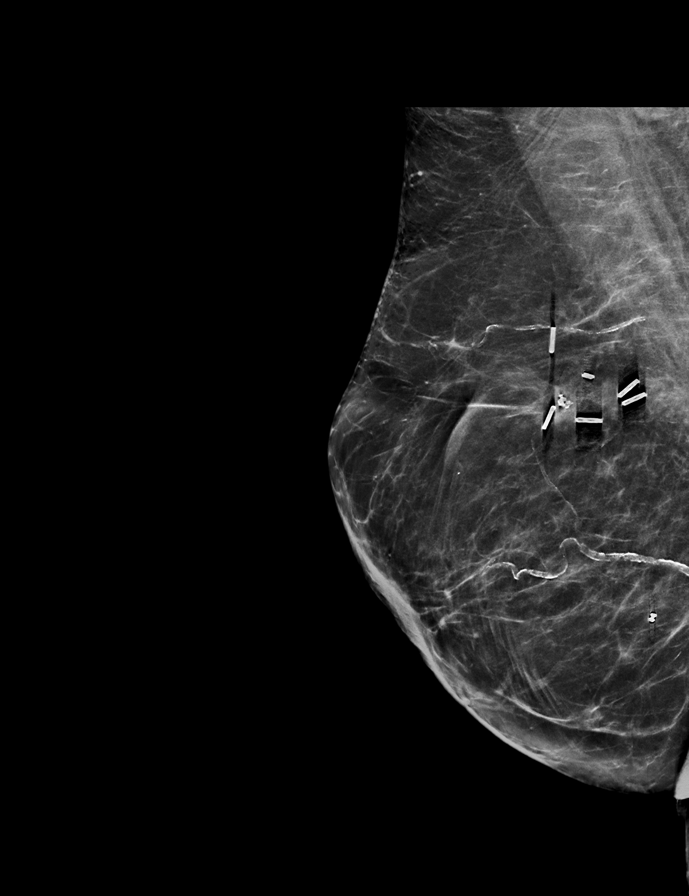

[R CC synth-2D]
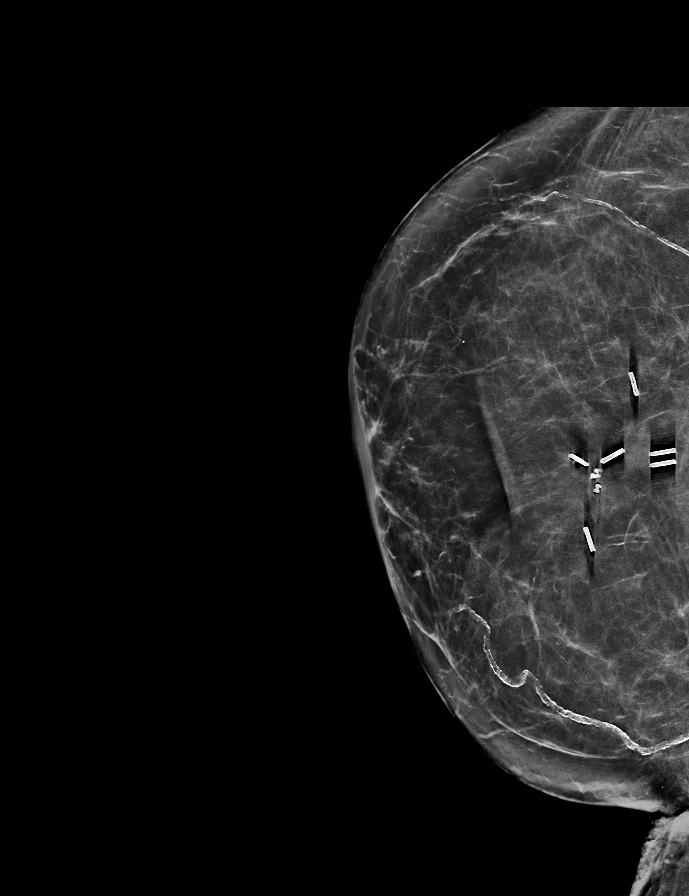

[L CC synth-2D]
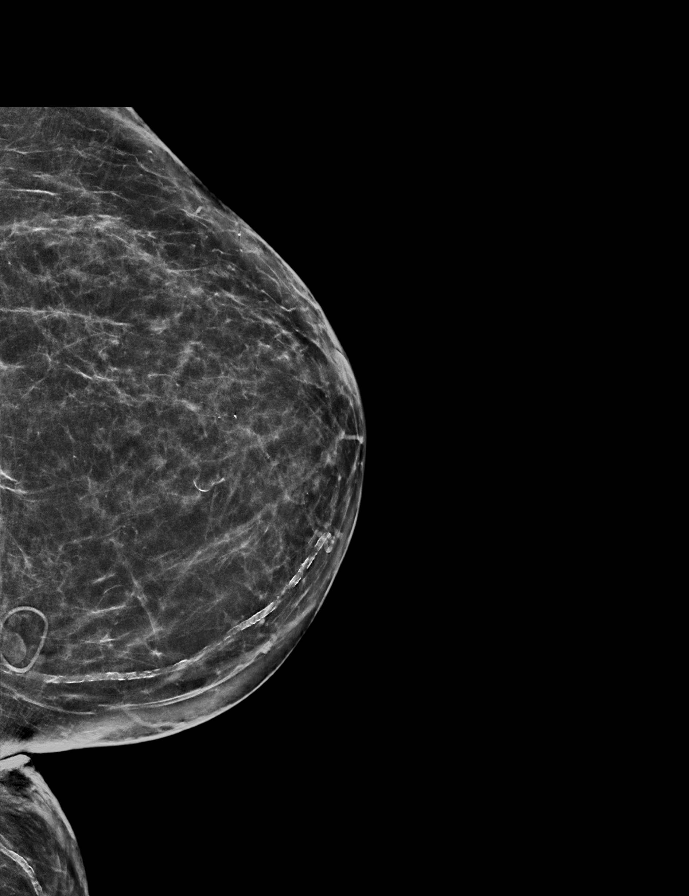

[R MLO tomo · 2 of 71 frames shown]
[frame 23/71]
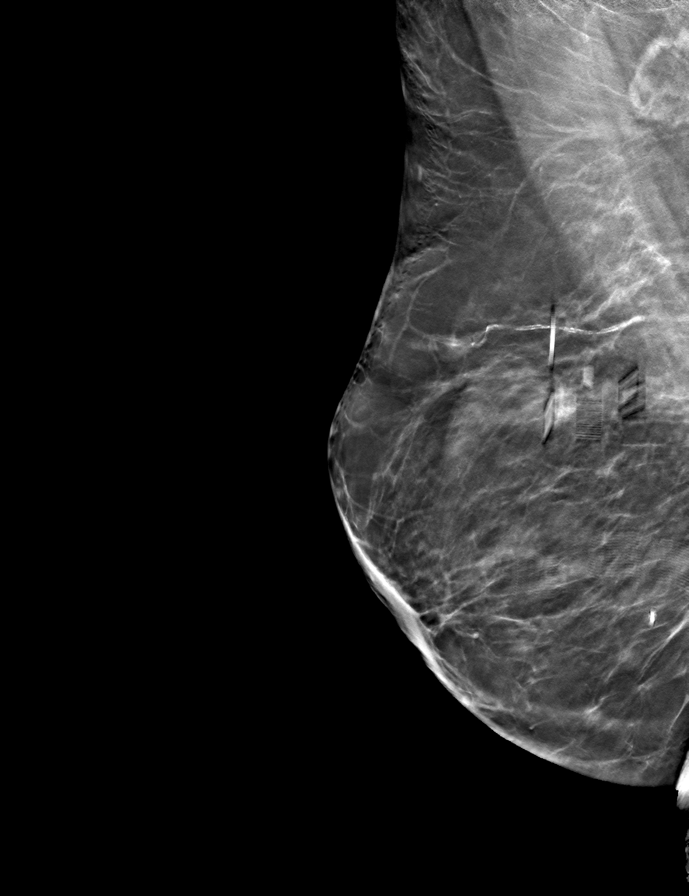
[frame 36/71]
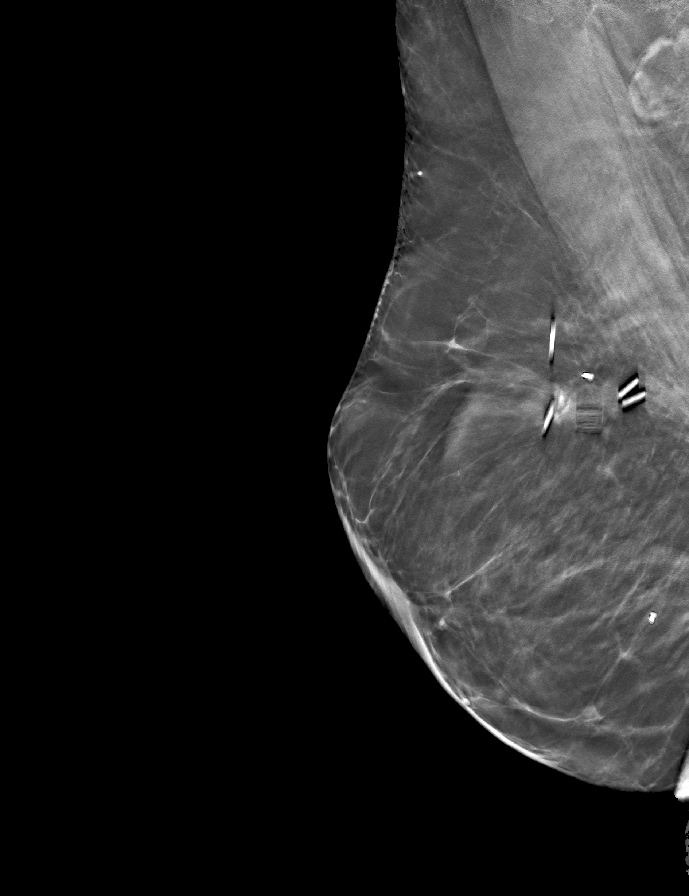

[L CC tomo · tomo slice 33/64.0]
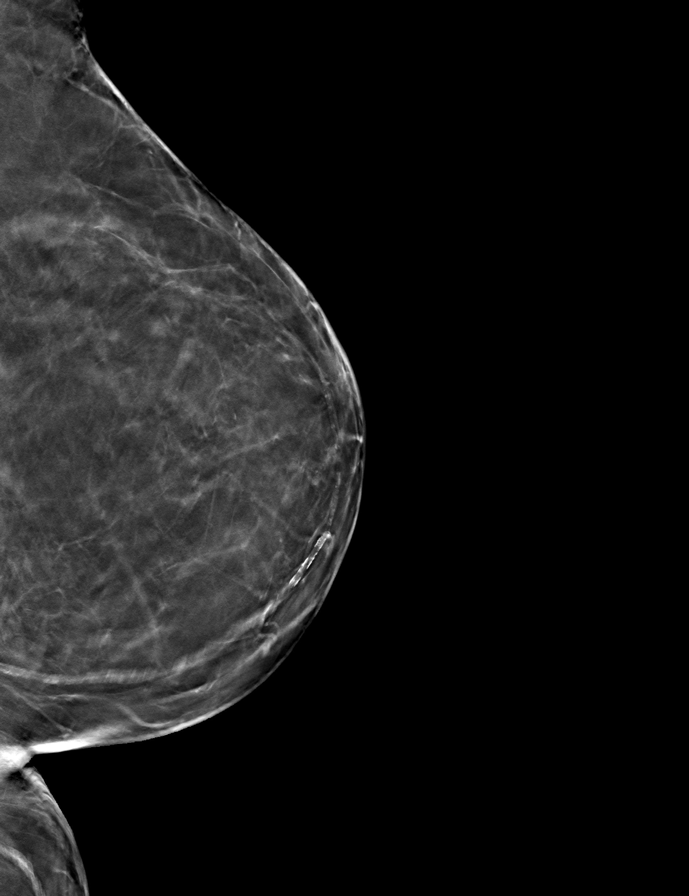

[R CC tomo · tomo slice 33/66.0]
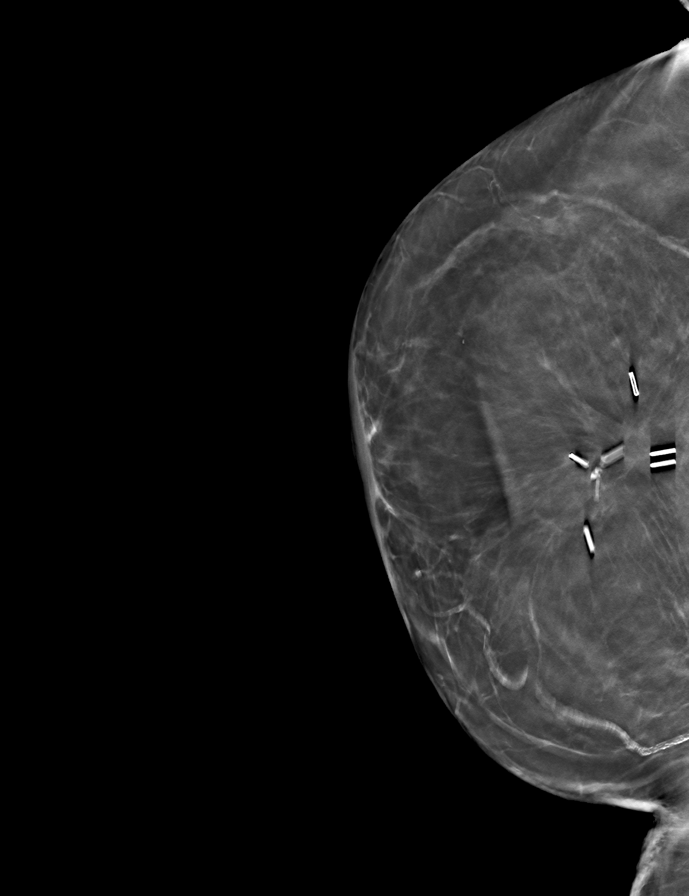

[L MLO tomo · tomo slice 33/66.0]
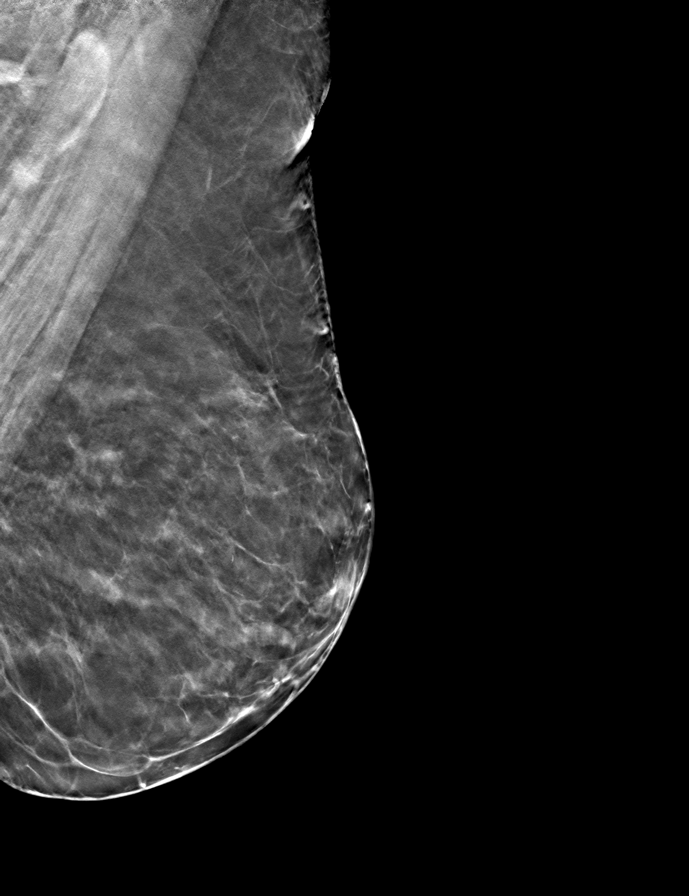

[9 of 24 positions shown; findings below may reference images not displayed]

ACR Breast Density Category b: There are scattered areas of
fibroglandular density.
FINDINGS: There are no findings suspicious for malignancy. Images were
processed with CAD.
IMPRESSION: No mammographic evidence of malignancy. A result letter of this
screening mammogram will be mailed directly to the patient.

RECOMMENDATION:
Screening mammogram in one year. (Code:[id number])

BI-RADS CATEGORY  1: Negative.

## 2020-03-04 ENCOUNTER — Telehealth: Payer: Self-pay | Admitting: Cardiology

## 2020-03-04 NOTE — Telephone Encounter (Signed)
°  Patient Consent for Virtual Visit         Angelica Grimes has provided verbal consent on 03/04/2020 for a virtual visit (video or telephone).   CONSENT FOR VIRTUAL VISIT FOR:  Angelica Grimes  By participating in this virtual visit I agree to the following:  I hereby voluntarily request, consent and authorize [facility] and its employed or contracted physicians, physician assistants, nurse practitioners or other licensed health care professionals (the Practitioner), to provide me with telemedicine health care services (the Services") as deemed necessary by the treating Practitioner. I acknowledge and consent to receive the Services by the Practitioner via telemedicine. I understand that the telemedicine visit will involve communicating with the Practitioner through live audiovisual communication technology and the disclosure of certain medical information by electronic transmission. I acknowledge that I have been given the opportunity to request an in-person assessment or other available alternative prior to the telemedicine visit and am voluntarily participating in the telemedicine visit.  I understand that I have the right to withhold or withdraw my consent to the use of telemedicine in the course of my care at any time, without affecting my right to future care or treatment, and that the Practitioner or I may terminate the telemedicine visit at any time. I understand that I have the right to inspect all information obtained and/or recorded in the course of the telemedicine visit and may receive copies of available information for a reasonable fee.  I understand that some of the potential risks of receiving the Services via telemedicine include:   Delay or interruption in medical evaluation due to technological equipment failure or disruption;  Information transmitted may not be sufficient (e.g. poor resolution of images) to allow for appropriate medical decision making by the Practitioner;  and/or   In rare instances, security protocols could fail, causing a breach of personal health information.  Furthermore, I acknowledge that it is my responsibility to provide information about my medical history, conditions and care that is complete and accurate to the best of my ability. I acknowledge that Practitioner's advice, recommendations, and/or decision may be based on factors not within their control, such as incomplete or inaccurate data provided by me or distortions of diagnostic images or specimens that may result from electronic transmissions. I understand that the practice of medicine is not an exact science and that Practitioner makes no warranties or guarantees regarding treatment outcomes. I acknowledge that a copy of this consent can be made available to me via my patient portal ([facility]), or I can request a printed copy by calling the office of [facility].    I understand that my insurance will be billed for this visit.   I have read or had this consent read to me.  I understand the contents of this consent, which adequately explains the benefits and risks of the Services being provided via telemedicine.   I have been provided ample opportunity to ask questions regarding this consent and the Services and have had my questions answered to my satisfaction.  I give my informed consent for the services to be provided through the use of telemedicine in my medical care

## 2020-03-11 ENCOUNTER — Encounter: Payer: Self-pay | Admitting: Family Medicine

## 2020-03-18 ENCOUNTER — Ambulatory Visit: Payer: Medicare Other | Admitting: Cardiology

## 2020-03-19 ENCOUNTER — Telehealth (INDEPENDENT_AMBULATORY_CARE_PROVIDER_SITE_OTHER): Payer: Medicare Other | Admitting: Cardiology

## 2020-03-19 ENCOUNTER — Other Ambulatory Visit: Payer: Self-pay

## 2020-03-19 ENCOUNTER — Encounter: Payer: Self-pay | Admitting: Cardiology

## 2020-03-19 VITALS — BP 141/73 | HR 63 | Ht 65.0 in | Wt 154.0 lb

## 2020-03-19 DIAGNOSIS — I10 Essential (primary) hypertension: Secondary | ICD-10-CM

## 2020-03-19 NOTE — Patient Instructions (Signed)
Your physician wants you to follow-up in: 6 MONTHS WITH DR BRANCH   Your physician recommends that you continue on your current medications as directed. Please refer to the Current Medication list given to you today.  Thank you for choosing [facility]!!   

## 2020-03-19 NOTE — Progress Notes (Signed)
Virtual Visit via Telephone Note   This visit type was conducted due to national recommendations for restrictions regarding the COVID-19 Pandemic (e.g. social distancing) in an effort to limit this patient's exposure and mitigate transmission in our community.  Due to her co-morbid illnesses, this patient is at least at moderate risk for complications without adequate follow up.  This format is felt to be most appropriate for this patient at this time.  The patient did not have access to video technology/had technical difficulties with video requiring transitioning to audio format only (telephone).  All issues noted in this document were discussed and addressed.  No physical exam could be performed with this format.  Please refer to the patient's chart for her  consent to telehealth for [facility].   The patient was identified using 2 identifiers.  Date:  03/19/2020   ID:  Angelica Grimes, DOB May 01, 1935, MRN [id number]  Patient Location: Home Provider Location: Office  PCP:  Perlie Mayo, NP  Cardiologist:  Carlyle Dolly, MD  Electrophysiologist:  None   Evaluation Performed:  Follow-Up Visit  Chief Complaint:  Follow up visit  History of Present Illness:    Angelica Grimes is a 85 y.o. female seen today for follow up of the following medical problems.     1. HTN - long history of HTN, but previously had been well controlledshe reports - over last few months higherandmoredifficult to control bp's. - compliant with meds  - checks home bp regularly. Typically 160s/80s - limiting sodium intake, no significant NSAID use. No prior history of sleep apnea.  - incontinent with HCTZ, she stopped taking just a few months ago on [place]. She stopped losartan since Januaryfor a reported side effect she does not remember. Has ACEI allergy with angioedema. Clonidine caused dry mouth.    - we lowered norvasc to 2.5mg  daily due to some swelling - swelling has improved.  She is taking HCTZ prn swelling.     The patient does not have symptoms concerning for COVID-19 infection (fever, chills, cough, or new shortness of breath).    Past Medical History:  Diagnosis Date  . Anxiety   . Brain tumor (benign) ([place])   . Cancer ([place])    breast  . Cancer ([place])    thyroid  . Fracture ankle fracture 01/24/16  . GERD (gastroesophageal reflux disease)   . Hyperlipidemia   . Hypertension   . Hypothyroidism   . IBS (irritable bowel syndrome)    Past Surgical History:  Procedure Laterality Date  . breast cyst surgery      x 2 - benign   . BREAST LUMPECTOMY     right breast with re-excision  . [place]   biopsy  . BUNIONECTOMY  2012   right foot  . COLONOSCOPY    . CYSTECTOMY  1983  . THYROIDECTOMY  1991   due to cancer  . TONSILLECTOMY       No outpatient medications have been marked as taking for the 03/19/20 encounter (Appointment) with Arnoldo Lenis, MD.     Allergies:   Shrimp [shellfish allergy], Aspirin, Benazepril, Penicillins, Clonidine derivatives, and Orange fruit [citrus]   Social History   Tobacco Use  . Smoking status: Never Smoker  . Smokeless tobacco: Never Used  Vaping Use  . Vaping Use: Never used  Substance Use Topics  . Alcohol use: Yes    Comment: rare  . Drug use: No  Family Hx: The patient's family history includes Atrial fibrillation in her brother, brother, and brother; Cancer in her brother, father, half-sister, half-sister, and sister; Diabetes in her sister; Pneumonia in her father and mother; Sarcoidosis in her sister; Stroke in her mother. There is no history of Colon cancer.  ROS:   Please see the history of present illness.     All other systems reviewed and are negative.   Prior CV studies:   The following studies were reviewed today:    Labs/Other Tests and Data Reviewed:    EKG:  No ECG reviewed.  Recent Labs: 08/06/2019: ALT 16; BUN 15; Creat 0.76; Hemoglobin  12.2; Platelets 186; Potassium 3.8; Sodium 141; TSH 0.79   Recent Lipid Panel Lab Results  Component Value Date/Time   CHOL 284 (H) 08/06/2019 12:15 PM   TRIG 158 (H) 08/06/2019 12:15 PM   HDL 42 (L) 08/06/2019 12:15 PM   CHOLHDL 6.8 (H) 08/06/2019 12:15 PM   LDLCALC 210 (H) 08/06/2019 12:15 PM    Wt Readings from Last 3 Encounters:  12/17/19 154 lb (69.9 kg)  08/06/19 152 lb 1.9 oz (69 kg)  06/05/19 154 lb 12.8 oz (70.2 kg)     Objective:    Vital Signs:   Today's Vitals   03/19/20 0939  BP: (!) 141/73  Pulse: 63  Weight: 154 lb (69.9 kg)  Height: 5\' 5"  (1.651 m)   Body mass index is 25.63 kg/m. Normal affect. Normal speech pattern and tone. Comfortable, no apparent distress. No audible signs of sob or wheezing.   ASSESSMENT & PLAN:    1. HTN -difficult to control based on medication side effects, she has not had resistant HTN.  - incontinence on HCTZ though taking prn swelling.. Unknown side effect on ARB she stopped taking. Listed allergy to benazepril with angioedema. Allergy to clonidine.  - current regimen has her controlled. Her AM bp's are elevated, but afernoon and evening bp's as he rmeds take effect are right at goal. Continue current therapy  COVID-19 Education: The signs and symptoms of COVID-19 were discussed with the patient and how to seek care for testing (follow up with PCP or arrange E-visit).  The importance of social distancing was discussed today.  Time:   Today, I have spent 14 minutes with the patient with telehealth technology discussing the above problems.     Medication Adjustments/Labs and Tests Ordered: Current medicines are reviewed at length with the patient today.  Concerns regarding medicines are outlined above.   Tests Ordered: No orders of the defined types were placed in this encounter.   Medication Changes: No orders of the defined types were placed in this encounter.   Follow Up:  In Person in 6  month(s)  Signed, Carlyle Dolly, MD  03/19/2020 7:52 AM    [employer]

## 2020-03-24 ENCOUNTER — Other Ambulatory Visit (HOSPITAL_COMMUNITY): Payer: Self-pay | Admitting: Family Medicine

## 2020-03-24 DIAGNOSIS — Z1231 Encounter for screening mammogram for malignant neoplasm of breast: Secondary | ICD-10-CM

## 2020-04-13 ENCOUNTER — Encounter: Payer: Self-pay | Admitting: Family Medicine

## 2020-04-13 ENCOUNTER — Other Ambulatory Visit: Payer: Self-pay

## 2020-04-13 ENCOUNTER — Ambulatory Visit (INDEPENDENT_AMBULATORY_CARE_PROVIDER_SITE_OTHER): Payer: Medicare Other | Admitting: Family Medicine

## 2020-04-13 VITALS — BP 154/78 | HR 71 | Temp 97.1°F | Resp 16 | Ht 64.5 in | Wt 153.1 lb

## 2020-04-13 DIAGNOSIS — E785 Hyperlipidemia, unspecified: Secondary | ICD-10-CM | POA: Diagnosis not present

## 2020-04-13 DIAGNOSIS — L821 Other seborrheic keratosis: Secondary | ICD-10-CM

## 2020-04-13 DIAGNOSIS — E890 Postprocedural hypothyroidism: Secondary | ICD-10-CM | POA: Diagnosis not present

## 2020-04-13 DIAGNOSIS — D0511 Intraductal carcinoma in situ of right breast: Secondary | ICD-10-CM | POA: Diagnosis not present

## 2020-04-13 DIAGNOSIS — Z0001 Encounter for general adult medical examination with abnormal findings: Secondary | ICD-10-CM

## 2020-04-13 DIAGNOSIS — I10 Essential (primary) hypertension: Secondary | ICD-10-CM | POA: Diagnosis not present

## 2020-04-13 DIAGNOSIS — Z780 Asymptomatic menopausal state: Secondary | ICD-10-CM

## 2020-04-13 DIAGNOSIS — R7301 Impaired fasting glucose: Secondary | ICD-10-CM

## 2020-04-13 NOTE — Assessment & Plan Note (Signed)
Low-fat friendly diet encouraged.  Continue current medications at this time.  Updated labs ordered

## 2020-04-13 NOTE — Assessment & Plan Note (Signed)
Noted skin changes.  Do not see any suspicious spots at this time.  But will do referral to dermatology for further follow-up and analysis and assessment.

## 2020-04-13 NOTE — Assessment & Plan Note (Signed)
-   DEXA scan ordered

## 2020-04-13 NOTE — Assessment & Plan Note (Signed)
Updated lab work ordered.  Denies have any signs or symptoms of uncontrolled hypothyroidism.

## 2020-04-13 NOTE — Patient Instructions (Addendum)
I appreciate the opportunity to provide you with care for your health and wellness. Today we discussed: overall health   Follow up: 09/01/2020    Fasting Labs today No Referrals today  Nice to see you today :)  Please continue to practice social distancing to keep you, your family, and our community safe.  If you must go out, please wear a mask and practice good handwashing.  It was a pleasure to see you and I look forward to continuing to work together on your health and well-being. Please do not hesitate to call the office if you need care or have questions about your care.  Have a wonderful day and week. With Gratitude, Cherly Beach, DNP, AGNP-BC  HEALTH MAINTENANCE RECOMMENDATIONS:  It is recommended that you get at least 30 minutes of aerobic exercise at least 5 days/week (for weight loss, you may need as much as 60-90 minutes). This can be any activity that gets your heart rate up. This can be divided in 10-15 minute intervals if needed, but try and build up your endurance at least once a week.  Weight bearing exercise is also recommended twice weekly.  Eat a healthy diet with lots of vegetables, fruits and fiber.  "Colorful" foods have a lot of vitamins (ie green vegetables, tomatoes, red peppers, etc).  Limit sweet tea, regular sodas and alcoholic beverages, all of which has a lot of calories and sugar.  Up to 1 alcoholic drink daily may be beneficial for women (unless trying to lose weight, watch sugars).  Drink a lot of water.  Calcium recommendations are 1200-1500 mg daily (1500 mg for postmenopausal women or women without ovaries), and vitamin D 1000 IU daily.  This should be obtained from diet and/or supplements (vitamins), and calcium should not be taken all at once, but in divided doses.  Monthly self breast exams and yearly mammograms for women over the age of 29 is recommended.  Sunscreen of at least SPF 30 should be used on all sun-exposed parts of the skin when outside  between the hours of 10 am and 4 pm (not just when at beach or pool, but even with exercise, golf, tennis, and yard work!)  Use a sunscreen that says "broad spectrum" so it covers both UVA and UVB rays, and make sure to reapply every 1-2 hours.  Remember to change the batteries in your smoke detectors when changing your clock times in the spring and fall.  Use your seat belt every time you are in a car, and please drive safely and not be distracted with cell phones and texting while driving.

## 2020-04-13 NOTE — Assessment & Plan Note (Signed)
Angelica Grimes is encouraged to maintain a well balanced diet that is low in salt. Not controlled, continue current medication regimen-reports medication only works when she eats with it. Additionally, she is also reminded that exercise is beneficial for heart health and control of Blood pressure. 30-60 minutes daily is recommended-walking was suggested.

## 2020-04-13 NOTE — Assessment & Plan Note (Signed)
Updated labs ordered.

## 2020-04-13 NOTE — Assessment & Plan Note (Signed)
Discussed monthly self breast exams and yearly mammograms; at least 30 minutes of aerobic activity at least 5 days/week and weight-bearing exercise 2x/week; proper sunscreen use reviewed; healthy diet, including goals of calcium and vitamin D intake and alcohol recommendations (less than or equal to 1 drink/day) reviewed; regular seatbelt use; changing batteries in smoke detectors.  Immunization recommendations discussed.  Colonoscopy recommendations reviewed.  

## 2020-04-13 NOTE — Progress Notes (Signed)
Health Maintenance reviewed -   Immunization History  Administered Date(s) Administered  . Influenza Split 06/19/2012  . Influenza,inj,Quad PF,6+ Mos 07/02/2018  . Influenza,inj,quad, With Preservative 06/27/2017  . Influenza-Unspecified 08/01/2015  . Moderna SARS-COVID-2 Vaccination 01/07/2020, 02/03/2020  . Zoster Recombinat (Shingrix) 01/29/2018   Last Pap smear: n/a Last mammogram: Oct 2021  Last colonoscopy: n/a Last DEXA: Ordered Dentist: Twice yearly Ophtho: yearly-glasses Exercise: walk some  Smoker: no  Alcohol Use: no  Other doctors caring for patient include:  Patient Care Team: Perlie Mayo, NP as PCP - General (Family Medicine) Harl Bowie, Alphonse Guild, MD as PCP - Cardiology (Cardiology) Gatha Mayer, MD as Consulting Physician (Radiation Oncology) Rolm Bookbinder, MD as Consulting Physician (General Surgery) Gari Crown, MD (Unknown Physician Specialty) Santo Held, MD (Unknown Physician Specialty) Danie Binder, MD (Inactive) as Consulting Physician (Gastroenterology) Danie Binder, MD (Inactive) as Consulting Physician (Gastroenterology)  End of Life Discussion:  Patient has a living will and medical power of attorney  Subjective:   HPI  Angelica Grimes is a 85 y.o. female who presents for annual wellness visit and follow-up on chronic medical conditions.  She has the following concerns: Some concerns about some spots that have popped up on her back over the last year.  Previously had seen Dr. Tarri Glenn for dermatology.  Has not been back in some time.  Denies having any injury or trauma to the site.  No issues or concerns with except for she does also make sure that they are okay.  She denies having any changes in her sleep habits.  Denies having any trouble chewing or swallowing or dentition changes.  Reports going to the dentist regularly.  Denies have any changes in bowel or bladder habits.  Denies have any blood in urine or stool.  Denies  have any changes in memory.  Denies having any falls or injury.  Skin concerns as stated above.  Is wearing glasses today are doing well for her.  Cataracts removed 4 years ago and doing well since then.  She denies have any chest pain, headache, shortness of breath, dizziness, appetite changes or weight changes.  Does report ankle and foot swelling.  Reports that she thinks this is related to the amlodipine.  She has her mammogram coming up in October.   Review Of Systems  Review of Systems  Constitutional: Negative.   HENT: Negative.   Eyes: Negative.   Respiratory: Negative.   Cardiovascular: Negative.   Gastrointestinal: Negative.   Endocrine: Negative.   Genitourinary: Negative.   Musculoskeletal: Negative.   Skin: Negative.        Skin changes  Allergic/Immunologic: Negative.   Neurological: Negative.   Hematological: Negative.   Psychiatric/Behavioral: Negative.   All other systems reviewed and are negative.   Objective:   PHYSICAL EXAM:  BP (!) 154/78 (BP Location: Right Arm, Patient Position: Sitting, Cuff Size: Normal)   Pulse 71   Temp (!) 97.1 F (36.2 C) (Temporal)   Resp 16   Ht 5' 4.5" (1.638 m)   Wt 153 lb 1.9 oz (69.5 kg)   SpO2 97%   BMI 25.88 kg/m   Physical Exam Vitals and nursing note reviewed.  Constitutional:      Appearance: Normal appearance. She is well-developed, well-groomed and overweight.  HENT:     Head: Normocephalic and atraumatic.     Right Ear: Hearing, tympanic membrane, ear canal and external ear normal.     Left Ear: Hearing, tympanic membrane, ear  canal and external ear normal.     Nose: Nose normal.     Mouth/Throat:     Lips: Pink.     Mouth: Mucous membranes are moist.     Pharynx: Oropharynx is clear. Uvula midline.  Eyes:     General: Lids are normal.     Extraocular Movements: Extraocular movements intact.     Conjunctiva/sclera: Conjunctivae normal.     Pupils: Pupils are equal, round, and reactive to light.    Neck:     Thyroid: No thyroid mass, thyromegaly or thyroid tenderness.     Vascular: No carotid bruit.  Cardiovascular:     Rate and Rhythm: Normal rate and regular rhythm.     Pulses: Normal pulses.          Radial pulses are 2+ on the right side and 2+ on the left side.       Dorsalis pedis pulses are 2+ on the right side and 2+ on the left side.       Posterior tibial pulses are 2+ on the right side and 2+ on the left side.     Heart sounds: Normal heart sounds.  Pulmonary:     Effort: Pulmonary effort is normal.     Breath sounds: Normal breath sounds and air entry.  Abdominal:     General: Abdomen is flat. Bowel sounds are normal.     Palpations: Abdomen is soft.     Tenderness: There is no abdominal tenderness. There is no right CVA tenderness or left CVA tenderness.     Hernia: No hernia is present.  Musculoskeletal:        General: Normal range of motion.     Cervical back: Full passive range of motion without pain, normal range of motion and neck supple.     Right lower leg: No edema.     Left lower leg: No edema.     Comments: MAE, ROM intact  With some limited in hip area  Lymphadenopathy:     Cervical: No cervical adenopathy.  Skin:    General: Skin is warm and dry.     Capillary Refill: Capillary refill takes less than 2 seconds.     Comments: Noted seborrheic keratosis on back  Neurological:     General: No focal deficit present.     Mental Status: She is alert and oriented to person, place, and time. Mental status is at baseline.     Cranial Nerves: Cranial nerves are intact.     Sensory: Sensation is intact.     Motor: Motor function is intact.     Coordination: Coordination is intact.     Gait: Gait is intact.     Deep Tendon Reflexes: Reflexes are normal and symmetric.  Psychiatric:        Attention and Perception: Attention and perception normal.        Mood and Affect: Mood and affect normal.        Speech: Speech normal.        Behavior: Behavior  normal. Behavior is cooperative.        Thought Content: Thought content normal.        Cognition and Memory: Cognition normal.        Judgment: Judgment normal.     Comments: Good eye contact and communication     Depression Screening  Depression screen [facility] 2/9 04/13/2020 08/06/2019  Decreased Interest 0 0  Down, Depressed, Hopeless 0 1  PHQ - 2 Score 0  1  Altered sleeping - 0  Tired, decreased energy - 0  Change in appetite - 0  Feeling bad or failure about yourself  - 0  Trouble concentrating - 0  Moving slowly or fidgety/restless - 0  Suicidal thoughts - 0  PHQ-9 Score - 1  Difficult doing work/chores - Not difficult at all     Falls  Fall Risk  04/13/2020 08/06/2019 09/10/2018 08/07/2014  Falls in the past year? 0 0 0 No  Number falls in past yr: 0 0 - -  Injury with Fall? 0 0 - -  Risk for fall due to : No Fall Risks - - -  Follow up Falls evaluation completed - - -    Assessment & Plan:   1. Annual visit for general adult medical examination with abnormal findings   2. Essential hypertension   3. Postsurgical hypothyroidism   4. Hyperlipidemia, unspecified hyperlipidemia type   5. Impaired fasting blood sugar   6. Seborrheic keratosis   7. Post-menopausal   8. Ductal carcinoma in situ (DCIS) of right breast     Tests ordered Orders Placed This Encounter  Procedures  . DG Bone Density  . CBC  . COMPLETE METABOLIC PANEL WITH GFR  . Lipid panel  . TSH  . Hemoglobin A1c  . Ambulatory referral to Dermatology     Plan: Please see assessment and plan per problem list above.   No orders of the defined types were placed in this encounter.  Medicare Attestation I have personally reviewed: The patient's medical and social history Their use of alcohol, tobacco or illicit drugs Their current medications and supplements The patient's functional ability including ADLs,fall risks, home safety risks, cognitive, and hearing and visual impairment Diet and  physical activities Evidence for depression or mood disorders  The patient's weight, height, BMI, and visual acuity have been recorded in the chart.  I have made referrals, counseling, and provided education to the patient based on review of the above and I have provided the patient with a written personalized care plan for preventive services.    Note: This dictation was prepared with Dragon dictation along with smaller phrase technology. Similar sounding words can be transcribed inadequately or may not be corrected upon review. Any transcriptional errors that result from this process are unintentional.      Perlie Mayo, NP   04/13/2020

## 2020-04-13 NOTE — Assessment & Plan Note (Signed)
Was being followed once yearly.  Reports she will get her mammogram and overall if there is nothing going on she does not want to have to go back and she does not need to.

## 2020-04-14 ENCOUNTER — Other Ambulatory Visit: Payer: Self-pay | Admitting: Family Medicine

## 2020-04-14 DIAGNOSIS — E890 Postprocedural hypothyroidism: Secondary | ICD-10-CM

## 2020-04-14 DIAGNOSIS — D432 Neoplasm of uncertain behavior of brain, unspecified: Secondary | ICD-10-CM | POA: Insufficient documentation

## 2020-04-14 LAB — COMPLETE METABOLIC PANEL WITH GFR
AG Ratio: 1.8 (calc) (ref 1.0–2.5)
ALT: 12 U/L (ref 6–29)
AST: 15 U/L (ref 10–35)
Albumin: 4.3 g/dL (ref 3.6–5.1)
Alkaline phosphatase (APISO): 67 U/L (ref 37–153)
BUN: 16 mg/dL (ref 7–25)
CO2: 31 mmol/L (ref 20–32)
Calcium: 9.2 mg/dL (ref 8.6–10.4)
Chloride: 100 mmol/L (ref 98–110)
Creat: 0.81 mg/dL (ref 0.60–0.88)
GFR, Est African American: 78 mL/min/{1.73_m2} (ref >=60)
GFR, Est Non African American: 68 mL/min/{1.73_m2} (ref >=60)
Globulin: 2.4 g/dL (calc) (ref 1.9–3.7)
Glucose, Bld: 91 mg/dL (ref 65–99)
Potassium: 3.7 mmol/L (ref 3.5–5.3)
Sodium: 141 mmol/L (ref 135–146)
Total Bilirubin: 0.7 mg/dL (ref 0.2–1.2)
Total Protein: 6.7 g/dL (ref 6.1–8.1)

## 2020-04-14 LAB — LIPID PANEL
Cholesterol: 171 mg/dL (ref <200)
HDL: 45 mg/dL — ABNORMAL LOW (ref >=50)
LDL Cholesterol (Calc): 103 mg/dL (calc) — ABNORMAL HIGH
Non-HDL Cholesterol (Calc): 126 mg/dL (calc) (ref <130)
Total CHOL/HDL Ratio: 3.8 (calc) (ref <5.0)
Triglycerides: 134 mg/dL (ref <150)

## 2020-04-14 LAB — CBC
HCT: 37.8 % (ref 35.0–45.0)
Hemoglobin: 12.3 g/dL (ref 11.7–15.5)
MCH: 28.1 pg (ref 27.0–33.0)
MCHC: 32.5 g/dL (ref 32.0–36.0)
MCV: 86.5 fL (ref 80.0–100.0)
MPV: 10.1 fL (ref 7.5–12.5)
Platelets: 168 10*3/uL (ref 140–400)
RBC: 4.37 10*6/uL (ref 3.80–5.10)
RDW: 13.5 % (ref 11.0–15.0)
WBC: 6 10*3/uL (ref 3.8–10.8)

## 2020-04-14 LAB — HEMOGLOBIN A1C
Hgb A1c MFr Bld: 6.1 % of total Hgb — ABNORMAL HIGH (ref <5.7)
Mean Plasma Glucose: 128 (calc)
eAG (mmol/L): 7.1 (calc)

## 2020-04-14 LAB — TSH: TSH: 0.36 mIU/L — ABNORMAL LOW (ref 0.40–4.50)

## 2020-04-14 MED ORDER — LEVOTHYROXINE SODIUM 75 MCG PO TABS: 75.0000 ug | ORAL_TABLET | Freq: Every day | ORAL | 0 refills | Status: DC

## 2020-04-21 ENCOUNTER — Encounter: Payer: Self-pay | Admitting: Family Medicine

## 2020-04-23 ENCOUNTER — Other Ambulatory Visit: Payer: Self-pay | Admitting: *Deleted

## 2020-04-23 MED ORDER — AMLODIPINE BESYLATE 2.5 MG PO TABS: 2.5000 mg | ORAL_TABLET | Freq: Every day | ORAL | 3 refills | Status: DC

## 2020-04-29 ENCOUNTER — Other Ambulatory Visit: Payer: Self-pay

## 2020-04-29 ENCOUNTER — Ambulatory Visit (HOSPITAL_COMMUNITY)
Admission: RE | Admit: 2020-04-29 | Discharge: 2020-04-29 | Disposition: A | Discharge status: Home / Self Care | Payer: Medicare Other | Source: Ambulatory Visit | Attending: Family Medicine | Admitting: Family Medicine

## 2020-04-29 DIAGNOSIS — Z780 Asymptomatic menopausal state: Secondary | ICD-10-CM | POA: Diagnosis present

## 2020-04-30 ENCOUNTER — Telehealth: Payer: Self-pay | Admitting: Family Medicine

## 2020-04-30 NOTE — Telephone Encounter (Signed)
LVM for pt returning call about lab results

## 2020-04-30 NOTE — Telephone Encounter (Signed)
Calling about lab results.

## 2020-05-10 ENCOUNTER — Other Ambulatory Visit: Payer: Self-pay

## 2020-05-10 DIAGNOSIS — E785 Hyperlipidemia, unspecified: Secondary | ICD-10-CM

## 2020-05-10 MED ORDER — ROSUVASTATIN CALCIUM 5 MG PO TABS: 5.0000 mg | ORAL_TABLET | Freq: Every day | ORAL | 1 refills | Status: DC

## 2020-05-24 ENCOUNTER — Other Ambulatory Visit (HOSPITAL_COMMUNITY): Payer: Medicare Other

## 2020-05-27 ENCOUNTER — Other Ambulatory Visit: Payer: Self-pay

## 2020-05-27 MED ORDER — OMEPRAZOLE 20 MG PO CPDR: 20.0000 mg | DELAYED_RELEASE_CAPSULE | Freq: Every day | ORAL | 0 refills | Status: DC

## 2020-05-31 ENCOUNTER — Ambulatory Visit (HOSPITAL_COMMUNITY): Payer: Medicare Other | Admitting: Hematology

## 2020-06-09 ENCOUNTER — Other Ambulatory Visit: Payer: Self-pay | Admitting: *Deleted

## 2020-06-09 MED ORDER — CARVEDILOL 25 MG PO TABS: 25.0000 mg | ORAL_TABLET | Freq: Two times a day (BID) | ORAL | 0 refills | Status: DC

## 2020-06-11 ENCOUNTER — Other Ambulatory Visit: Payer: Self-pay | Admitting: *Deleted

## 2020-06-11 DIAGNOSIS — E890 Postprocedural hypothyroidism: Secondary | ICD-10-CM

## 2020-06-11 MED ORDER — OMEPRAZOLE 20 MG PO CPDR: 20.0000 mg | DELAYED_RELEASE_CAPSULE | Freq: Every day | ORAL | 0 refills | Status: DC

## 2020-06-11 MED ORDER — LEVOTHYROXINE SODIUM 75 MCG PO TABS: 75.0000 ug | ORAL_TABLET | Freq: Every day | ORAL | 0 refills | Status: DC

## 2020-06-16 ENCOUNTER — Other Ambulatory Visit: Payer: Self-pay | Admitting: *Deleted

## 2020-06-16 ENCOUNTER — Other Ambulatory Visit: Payer: Self-pay | Admitting: Family Medicine

## 2020-06-16 DIAGNOSIS — E890 Postprocedural hypothyroidism: Secondary | ICD-10-CM

## 2020-06-28 ENCOUNTER — Encounter: Payer: Self-pay | Admitting: Family Medicine

## 2020-07-12 ENCOUNTER — Other Ambulatory Visit: Payer: Self-pay

## 2020-07-12 ENCOUNTER — Ambulatory Visit (HOSPITAL_COMMUNITY)
Admission: RE | Admit: 2020-07-12 | Discharge: 2020-07-12 | Disposition: A | Discharge status: Home / Self Care | Payer: Medicare Other | Source: Ambulatory Visit | Attending: Family Medicine | Admitting: Family Medicine

## 2020-07-12 DIAGNOSIS — Z1231 Encounter for screening mammogram for malignant neoplasm of breast: Secondary | ICD-10-CM | POA: Diagnosis present

## 2020-08-30 ENCOUNTER — Other Ambulatory Visit: Payer: Self-pay | Admitting: Family Medicine

## 2020-08-31 ENCOUNTER — Other Ambulatory Visit: Payer: Self-pay

## 2020-08-31 DIAGNOSIS — E785 Hyperlipidemia, unspecified: Secondary | ICD-10-CM

## 2020-08-31 MED ORDER — ROSUVASTATIN CALCIUM 5 MG PO TABS: 5.0000 mg | ORAL_TABLET | Freq: Every day | ORAL | 1 refills | Status: DC

## 2020-09-01 ENCOUNTER — Encounter: Payer: Self-pay | Admitting: Family Medicine

## 2020-09-01 ENCOUNTER — Ambulatory Visit (INDEPENDENT_AMBULATORY_CARE_PROVIDER_SITE_OTHER): Payer: Medicare Other | Admitting: Family Medicine

## 2020-09-01 ENCOUNTER — Other Ambulatory Visit: Payer: Self-pay

## 2020-09-01 VITALS — BP 138/78 | HR 76 | Temp 98.0°F | Ht 65.0 in | Wt 139.0 lb

## 2020-09-01 DIAGNOSIS — I10 Essential (primary) hypertension: Secondary | ICD-10-CM

## 2020-09-01 DIAGNOSIS — E890 Postprocedural hypothyroidism: Secondary | ICD-10-CM

## 2020-09-01 DIAGNOSIS — R32 Unspecified urinary incontinence: Secondary | ICD-10-CM | POA: Insufficient documentation

## 2020-09-01 NOTE — Progress Notes (Signed)
Subjective:  Patient ID: Angelica Grimes, female    DOB: Mar 05, 1935  Age: 85 y.o. MRN: [id number]  CC:  Chief Complaint  Patient presents with  . Follow-up      HPI  HPI Angelica Grimes is a 85 year old female patient who presents today for follow-up on thyroid.  Previous labs demonstrated overcorrection her dose was adjusted and she presents today to follow-up on that and get updated labs.  She denies having any signs symptoms of hypo or hyperthyroidism today in the office.  Overall doing well, but does mention some overactive bladder issues and is willing to try Kegel exercises to see if that helps.  In addition she reports that her blood pressure is high in the mornings but when she takes her blood pressure medicine it goes down.  She denies having any chest pain, cough, shortness of breath, fever, chills, sinus symptoms, headaches or dizziness.  Today patient denies signs and symptoms of COVID 19 infection including fever, chills, cough, shortness of breath, and headache. Past Medical, Surgical, Social History, Allergies, and Medications have been Reviewed.   Past Medical History:  Diagnosis Date  . Anxiety   . Brain tumor (benign) ([place])   . Cancer ([place])    breast  . Cancer ([place])    thyroid  . Fracture ankle fracture 01/24/16  . GERD (gastroesophageal reflux disease)   . Hyperlipidemia   . Hypertension   . Hypothyroidism   . IBS (irritable bowel syndrome)   . Low iron stores 11/08/2016  . Positive colorectal cancer screening using Cologuard test 05/01/2018    Current Meds  Medication Sig  . ALPRAZolam (XANAX) 0.25 MG tablet Take 0.25 mg by mouth as needed. anxiety  . CALCIUM-MAGNESIUM-ZINC PO Take 2 tablets by mouth daily.  . carvedilol (COREG) 25 MG tablet Take 1 tablet (25 mg total) by mouth 2 (two) times daily. as directed  . co-enzyme Q-10 50 MG capsule Take 100 mg by mouth daily.  . Cyanocobalamin (VITAMIN B 12 PO) Take 1,000 mcg by mouth.  Marland Kitchen FLUAD 0.5 ML SUSY  Inject as directed See admin instructions.  . Glucosamine-Chondroitin (OSTEO BI-FLEX REGULAR STRENGTH PO) Take 2 tablets by mouth daily.  . hydrochlorothiazide (HYDRODIURIL) 25 MG tablet Take 25 mg by mouth. As needed for swelling  . levothyroxine (SYNTHROID) 75 MCG tablet Take 1 tablet (75 mcg total) by mouth daily.  Marland Kitchen omeprazole (PRILOSEC) 20 MG capsule Take 1 capsule (20 mg total) by mouth daily.  . rosuvastatin (CRESTOR) 5 MG tablet Take 1 tablet (5 mg total) by mouth daily.    ROS:  Review of Systems  Constitutional: Negative.   HENT: Negative.   Eyes: Negative.   Respiratory: Negative.   Cardiovascular: Negative.   Gastrointestinal: Negative.   Genitourinary: Negative.   Musculoskeletal: Negative.   Skin: Negative.   Neurological: Negative.   Endo/Heme/Allergies: Negative.   Psychiatric/Behavioral: Negative.      Objective:   Today's Vitals: BP 138/78 (BP Location: Left Arm, Patient Position: Sitting, Cuff Size: Normal)   Pulse 76   Temp 98 F (36.7 C) (Temporal)   Ht 5\' 5"  (1.651 m)   Wt 139 lb (63 kg)   SpO2 98%   BMI 23.13 kg/m  Vitals with BMI 09/01/2020 04/13/2020 03/19/2020  Height 5\' 5"  5' 4.5" 5\' 5"   Weight 139 lbs 153 lbs 2 oz 154 lbs  BMI 23.13 68.12 75.17  Systolic 001 749 449  Diastolic 78 78 73  Pulse 76  71 63     Physical Exam Vitals and nursing note reviewed.  Constitutional:      Appearance: Normal appearance. She is well-developed, well-groomed and normal weight.  HENT:     Head: Normocephalic and atraumatic.     Right Ear: External ear normal.     Left Ear: External ear normal.     Mouth/Throat:     Comments: Mask in place  Eyes:     General:        Right eye: No discharge.        Left eye: No discharge.     Conjunctiva/sclera: Conjunctivae normal.  Cardiovascular:     Rate and Rhythm: Normal rate and regular rhythm.     Pulses: Normal pulses.     Heart sounds: Normal heart sounds.  Pulmonary:     Effort: Pulmonary effort is  normal.     Breath sounds: Normal breath sounds.  Musculoskeletal:        General: Normal range of motion.     Cervical back: Normal range of motion and neck supple.  Skin:    General: Skin is warm.  Neurological:     General: No focal deficit present.     Mental Status: She is alert and oriented to person, place, and time.  Psychiatric:        Attention and Perception: Attention normal.        Mood and Affect: Mood normal.        Speech: Speech normal.        Behavior: Behavior normal. Behavior is cooperative.        Thought Content: Thought content normal.        Cognition and Memory: Cognition normal.        Judgment: Judgment normal.     Assessment   1. Postsurgical hypothyroidism   2. Primary hypertension   3. Urinary incontinence, unspecified type     Tests ordered Orders Placed This Encounter  Procedures  . TSH     Plan: Please see assessment and plan per problem list above.   No orders of the defined types were placed in this encounter.   Patient to follow-up in July CPE 2022 .  Note: This dictation was prepared with Dragon dictation along with smaller phrase technology. Similar sounding words can be transcribed inadequately or may not be corrected upon review. Any transcriptional errors that result from this process are unintentional.      Perlie Mayo, NP

## 2020-09-01 NOTE — Assessment & Plan Note (Signed)
Angelica Grimes is encouraged to maintain a well balanced diet that is low in salt. Controlled, continue current medication regimen.  Additionally, she is also reminded that exercise is beneficial for heart health and control of  Blood pressure. 30-60 minutes daily is recommended-walking was suggested.

## 2020-09-01 NOTE — Assessment & Plan Note (Signed)
[place] exercises provided today.

## 2020-09-01 NOTE — Assessment & Plan Note (Signed)
Updated labs ordered secondary to dose change due to overcorrection. She denies S&S of poor control.

## 2020-09-01 NOTE — Patient Instructions (Addendum)
I appreciate the opportunity to provide you with care for your health and wellness. Today we discussed: overall health  Follow up: July for CPE- fasting labs same day  Lab- today No referrals today  Have a wonderful Christmas and New Year!  Call if you need anything prior to your next appt.  Please continue to practice social distancing to keep you, your family, and our community safe.  If you must go out, please wear a mask and practice good handwashing.  It was a pleasure to see you and I look forward to continuing to work together on your health and well-being. Please do not hesitate to call the office if you need care or have questions about your care.  Have a wonderful day and week. With Gratitude, Cherly Beach, DNP, AGNP-BC     Kegel Exercises  Kegel exercises can help strengthen your pelvic floor muscles. The pelvic floor is a group of muscles that support your rectum, small intestine, and bladder. In females, pelvic floor muscles also help support the womb (uterus). These muscles help you control the flow of urine and stool. Kegel exercises are painless and simple, and they do not require any equipment. Your provider may suggest Kegel exercises to:  Improve bladder and bowel control.  Improve sexual response.  Improve weak pelvic floor muscles after surgery to remove the uterus (hysterectomy) or pregnancy (females).  Improve weak pelvic floor muscles after prostate gland removal or surgery (males). Kegel exercises involve squeezing your pelvic floor muscles, which are the same muscles you squeeze when you try to stop the flow of urine or keep from passing gas. The exercises can be done while sitting, standing, or lying down, but it is best to vary your position. Exercises How to do Kegel exercises: 1. Squeeze your pelvic floor muscles tight. You should feel a tight lift in your rectal area. If you are a female, you should also feel a tightness in your vaginal area.  Keep your stomach, buttocks, and legs relaxed. 2. Hold the muscles tight for up to 10 seconds. 3. Breathe normally. 4. Relax your muscles. 5. Repeat as told by your health care provider. Repeat this exercise daily as told by your health care provider. Continue to do this exercise for at least 4-6 weeks, or for as long as told by your health care provider. You may be referred to a physical therapist who can help you learn more about how to do Kegel exercises. Depending on your condition, your health care provider may recommend:  Varying how long you squeeze your muscles.  Doing several sets of exercises every day.  Doing exercises for several weeks.  Making Kegel exercises a part of your regular exercise routine. This information is not intended to replace advice given to you by your health care provider. Make sure you discuss any questions you have with your health care provider. Document Revised: 05/08/2018 Document Reviewed: 05/08/2018 Elsevier Patient Education  [place].

## 2020-09-02 LAB — TSH: TSH: 6.6 u[IU]/mL — ABNORMAL HIGH (ref 0.450–4.500)

## 2020-09-07 ENCOUNTER — Other Ambulatory Visit: Payer: Self-pay | Admitting: Family Medicine

## 2020-09-07 DIAGNOSIS — E890 Postprocedural hypothyroidism: Secondary | ICD-10-CM

## 2020-09-07 DIAGNOSIS — R7303 Prediabetes: Secondary | ICD-10-CM

## 2020-09-07 MED ORDER — LEVOTHYROXINE SODIUM 88 MCG PO TABS: 88.0000 ug | ORAL_TABLET | Freq: Every day | ORAL | 3 refills | Status: DC

## 2020-09-21 ENCOUNTER — Encounter: Payer: Self-pay | Admitting: Family Medicine

## 2020-09-22 ENCOUNTER — Other Ambulatory Visit: Payer: Self-pay | Admitting: Family Medicine

## 2020-09-22 DIAGNOSIS — E890 Postprocedural hypothyroidism: Secondary | ICD-10-CM

## 2020-09-23 ENCOUNTER — Other Ambulatory Visit: Payer: Self-pay | Admitting: Family Medicine

## 2020-09-28 ENCOUNTER — Encounter: Payer: Self-pay | Admitting: Family Medicine

## 2020-09-28 ENCOUNTER — Other Ambulatory Visit: Payer: Self-pay

## 2020-09-28 MED ORDER — OMEPRAZOLE 20 MG PO CPDR
20.0000 mg | DELAYED_RELEASE_CAPSULE | Freq: Every day | ORAL | 0 refills | Status: DC
Start: 2020-09-28 — End: 2020-12-22

## 2020-09-28 MED ORDER — CARVEDILOL 25 MG PO TABS
25.0000 mg | ORAL_TABLET | Freq: Two times a day (BID) | ORAL | 0 refills | Status: DC
Start: 2020-09-28 — End: 2020-12-22

## 2020-09-28 NOTE — Telephone Encounter (Signed)
Have you seen this come? I was not here yesterday, and didn't see anything last week. Please refill these for her. Thank you and lets see why we keep having trouble with [facility] request.

## 2020-10-14 LAB — CBC
Hematocrit: 37.4 % (ref 34.0–46.6)
Hemoglobin: 12.3 g/dL (ref 11.1–15.9)
MCH: 28.7 pg (ref 26.6–33.0)
MCHC: 32.9 g/dL (ref 31.5–35.7)
MCV: 87 fL (ref 79–97)
Platelets: 161 10*3/uL (ref 150–450)
RBC: 4.28 x10E6/uL (ref 3.77–5.28)
RDW: 13.4 % (ref 11.7–15.4)
WBC: 4.9 10*3/uL (ref 3.4–10.8)

## 2020-10-14 LAB — BASIC METABOLIC PANEL
BUN/Creatinine Ratio: 16 (ref 12–28)
BUN: 13 mg/dL (ref 8–27)
CO2: 25 mmol/L (ref 20–29)
Calcium: 9 mg/dL (ref 8.7–10.3)
Chloride: 101 mmol/L (ref 96–106)
Creatinine, Ser: 0.8 mg/dL (ref 0.57–1.00)
GFR calc Af Amer: 79 mL/min/{1.73_m2} (ref >59)
GFR calc non Af Amer: 68 mL/min/{1.73_m2} (ref >59)
Glucose: 102 mg/dL — ABNORMAL HIGH (ref 65–99)
Potassium: 4.1 mmol/L (ref 3.5–5.2)
Sodium: 143 mmol/L (ref 134–144)

## 2020-10-14 LAB — HEMOGLOBIN A1C
Est. average glucose Bld gHb Est-mCnc: 126 mg/dL
Hgb A1c MFr Bld: 6 % — ABNORMAL HIGH (ref 4.8–5.6)

## 2020-10-14 LAB — TSH: TSH: 1.68 u[IU]/mL (ref 0.450–4.500)

## 2020-10-14 NOTE — Progress Notes (Signed)
Left message

## 2020-12-20 ENCOUNTER — Other Ambulatory Visit: Payer: Self-pay | Admitting: Family Medicine

## 2020-12-20 DIAGNOSIS — E890 Postprocedural hypothyroidism: Secondary | ICD-10-CM

## 2020-12-22 ENCOUNTER — Other Ambulatory Visit: Payer: Self-pay | Admitting: Family Medicine

## 2021-02-24 ENCOUNTER — Other Ambulatory Visit: Payer: Self-pay

## 2021-02-24 ENCOUNTER — Encounter: Payer: Self-pay | Admitting: Nurse Practitioner

## 2021-02-24 ENCOUNTER — Ambulatory Visit (INDEPENDENT_AMBULATORY_CARE_PROVIDER_SITE_OTHER): Payer: Medicare Other | Admitting: Nurse Practitioner

## 2021-02-24 VITALS — BP 168/78 | HR 78 | Temp 97.9°F | Resp 20 | Ht 64.5 in | Wt 152.0 lb

## 2021-02-24 DIAGNOSIS — I10 Essential (primary) hypertension: Secondary | ICD-10-CM | POA: Diagnosis not present

## 2021-02-24 DIAGNOSIS — R7301 Impaired fasting glucose: Secondary | ICD-10-CM

## 2021-02-24 DIAGNOSIS — E785 Hyperlipidemia, unspecified: Secondary | ICD-10-CM | POA: Diagnosis not present

## 2021-02-24 DIAGNOSIS — E890 Postprocedural hypothyroidism: Secondary | ICD-10-CM

## 2021-02-24 MED ORDER — LOSARTAN POTASSIUM 50 MG PO TABS: 50.0000 mg | ORAL_TABLET | Freq: Every day | ORAL | 1 refills | Status: DC

## 2021-02-24 NOTE — Assessment & Plan Note (Addendum)
BP Readings from Last 3 Encounters:  02/24/21 (!) 168/78  09/01/20 138/78  04/13/20 (!) 154/78   -Rx. Losartan -she will check her BP at least once per day and as needed for headaches or lightheadedness -med check at her lab appt in 6 weeks

## 2021-02-24 NOTE — Assessment & Plan Note (Signed)
-  will check labs prior to next visit

## 2021-02-24 NOTE — Assessment & Plan Note (Signed)
-  checking labs with next visit

## 2021-02-24 NOTE — Patient Instructions (Signed)
Please have fasting labs drawn 2-3 days prior to your appointment so we can discuss the results during your office visit.  

## 2021-02-24 NOTE — Assessment & Plan Note (Signed)
-  check A1c with next set of labs

## 2021-02-24 NOTE — Progress Notes (Signed)
Acute Office Visit  Subjective:    Patient ID: Angelica Grimes, female    DOB: 16-Jan-1935, 86 y.o.   MRN: [id number]  Chief Complaint  Patient presents with  . Hypertension    B/P readings have been high x 2 weeks.     HPI Patient is in today for BP check. She has had elevated readings for the last 2 weeks. Her home readings are as high as 165/83 with most SBP s in the mid 140s-150s. Asymptomatic.  Past Medical History:  Diagnosis Date  . Anxiety   . Brain tumor (benign) ([place])   . Cancer ([place])    breast  . Cancer ([place])    thyroid  . Fracture ankle fracture 01/24/16  . GERD (gastroesophageal reflux disease)   . Hyperlipidemia   . Hypertension   . Hypothyroidism   . IBS (irritable bowel syndrome)   . Low iron stores 11/08/2016  . Positive colorectal cancer screening using Cologuard test 05/01/2018    Past Surgical History:  Procedure Laterality Date  . breast cyst surgery      x 2 - benign   . BREAST LUMPECTOMY     right breast with re-excision  . [place]   biopsy  . BUNIONECTOMY  2012   right foot  . COLONOSCOPY    . CYSTECTOMY  1983  . THYROIDECTOMY  1991   due to cancer  . TONSILLECTOMY      Family History  Problem Relation Age of Onset  . Stroke Mother   . Pneumonia Mother   . Cancer Father        bladder, prostate  . Pneumonia Father   . Cancer Sister        kidney  . Cancer Brother        lung  . Diabetes Sister   . Sarcoidosis Sister   . Cancer Half-Sister        kidney cancer  . Cancer Half-Sister        lung cancer  . Atrial fibrillation Brother   . Atrial fibrillation Brother   . Atrial fibrillation Brother   . Colon cancer Neg Hx     Social History   Socioeconomic History  . Marital status: Widowed    Spouse name: Not on file  . Number of children: 2  . Years of education: 5  . Highest education level: Not on file  Occupational History  . Not on file  Tobacco Use  . Smoking status: Never Smoker  .  Smokeless tobacco: Never Used  Vaping Use  . Vaping Use: Never used  Substance and Sexual Activity  . Alcohol use: Yes    Comment: rare  . Drug use: No  . Sexual activity: Not Currently  Other Topics Concern  . Not on file  Social History Narrative   Lives with grandson      Widow   2 children   1: [place] Daughter-Letitia (has permission to get info) 2 children: 1 son and daughter (son lives with her)   2: [place] Son-[name] 1 son      No pets      Diet: eats what she wants   Caffeine: none,  Maybe a coke at times   Water: 3-4 bottles of 16 oz      Wear seat belt   Wear sun protection if needed   Smoke detectors and Security system    Does not use phone in car  Social Determinants of Health   Financial Resource Strain: Not on file  Food Insecurity: Not on file  Transportation Needs: Not on file  Physical Activity: Not on file  Stress: Not on file  Social Connections: Not on file  Intimate Partner Violence: Not on file    Outpatient Medications Prior to Visit  Medication Sig Dispense Refill  . ALPRAZolam (XANAX) 0.25 MG tablet Take 0.25 mg by mouth as needed. anxiety    . CALCIUM-MAGNESIUM-ZINC PO Take 2 tablets by mouth daily.    . carvedilol (COREG) 25 MG tablet TAKE 1 TABLET BY MOUTH TWICE DAILY AS DIRECTED 180 tablet 0  . co-enzyme Q-10 50 MG capsule Take 100 mg by mouth daily.    . Cyanocobalamin (VITAMIN B 12 PO) Take 1,000 mcg by mouth.    Marland Kitchen FLUAD 0.5 ML SUSY Inject as directed See admin instructions.    . Glucosamine-Chondroitin (OSTEO BI-FLEX REGULAR STRENGTH PO) Take 2 tablets by mouth daily.    . hydrochlorothiazide (HYDRODIURIL) 25 MG tablet Take 25 mg by mouth. As needed for swelling    . omeprazole (PRILOSEC) 20 MG capsule TAKE ONE CAPSULE BY MOUTH DAILY 90 capsule 0  . rosuvastatin (CRESTOR) 5 MG tablet Take 1 tablet (5 mg total) by mouth daily. 90 tablet 1  . SYNTHROID 88 MCG tablet TAKE 1 TABLET BY MOUTH EVERY DAY 30 tablet 3  . amLODipine  (NORVASC) 2.5 MG tablet Take 1 tablet (2.5 mg total) by mouth daily. 90 tablet 3   No facility-administered medications prior to visit.    Allergies  Allergen Reactions  . Shrimp [Shellfish Allergy] Hives    Shrimp only  . Aspirin Nausea Only and Other (See Comments)    Tightness of chest  . Benazepril Swelling    Entire face, lips, throat  . Other   . Penicillins Hives, Itching and Rash    Abdominal area only  . Clonidine Derivatives Other (See Comments)    Dryness of mouth, lowers blood pressure and pulse   . Orange Fruit [Citrus] Rash    Fever blisters and blisters on knuckles     Review of Systems  Constitutional: Negative.   Respiratory: Negative.   Cardiovascular: Negative.   Psychiatric/Behavioral: Negative.        Objective:    Physical Exam Constitutional:      Appearance: Normal appearance.  Cardiovascular:     Rate and Rhythm: Normal rate and regular rhythm.     Pulses: Normal pulses.     Heart sounds: Normal heart sounds.  Pulmonary:     Effort: Pulmonary effort is normal.     Breath sounds: Normal breath sounds.  Neurological:     Mental Status: She is alert.  Psychiatric:        Mood and Affect: Mood normal.        Behavior: Behavior normal.        Thought Content: Thought content normal.        Judgment: Judgment normal.     BP (!) 168/78   Pulse 78   Temp 97.9 F (36.6 C)   Resp 20   Ht 5' 4.5" (1.638 m)   Wt 152 lb (68.9 kg)   SpO2 95%   BMI 25.69 kg/m  Wt Readings from Last 3 Encounters:  02/24/21 152 lb (68.9 kg)  09/01/20 139 lb (63 kg)  04/13/20 153 lb 1.9 oz (69.5 kg)    Health Maintenance Due  Topic Date Due  . TETANUS/TDAP  Never done  .  PNA vac Low Risk Adult (1 of 2 - PCV13) Never done  . COVID-19 Vaccine (3 - Moderna risk 4-dose series) 03/02/2020    There are no preventive care reminders to display for this patient.   Lab Results  Component Value Date   TSH 1.680 10/13/2020   Lab Results  Component Value  Date   WBC 4.9 10/13/2020   HGB 12.3 10/13/2020   HCT 37.4 10/13/2020   MCV 87 10/13/2020   PLT 161 10/13/2020   Lab Results  Component Value Date   NA 143 10/13/2020   K 4.1 10/13/2020   CO2 25 10/13/2020   GLUCOSE 102 (H) 10/13/2020   BUN 13 10/13/2020   CREATININE 0.80 10/13/2020   BILITOT 0.7 04/13/2020   ALKPHOS 55 05/08/2019   AST 15 04/13/2020   ALT 12 04/13/2020   PROT 6.7 04/13/2020   ALBUMIN 3.8 05/08/2019   CALCIUM 9.0 10/13/2020   ANIONGAP 7 05/08/2019   Lab Results  Component Value Date   CHOL 171 04/13/2020   Lab Results  Component Value Date   HDL 45 (L) 04/13/2020   Lab Results  Component Value Date   LDLCALC 103 (H) 04/13/2020   Lab Results  Component Value Date   TRIG 134 04/13/2020   Lab Results  Component Value Date   CHOLHDL 3.8 04/13/2020   Lab Results  Component Value Date   HGBA1C 6.0 (H) 10/13/2020       Assessment & Plan:   Problem List Items Addressed This Visit      Cardiovascular and Mediastinum   Hypertension    BP Readings from Last 3 Encounters:  02/24/21 (!) 168/78  09/01/20 138/78  04/13/20 (!) 154/78   -Rx. Losartan -she will check her BP at least once per day and as needed for headaches or lightheadedness -med check at her lab appt in 6 weeks      Relevant Medications   losartan (COZAAR) 50 MG tablet   Other Relevant Orders   CBC with Differential/Platelet   CMP14+EGFR   Lipid Panel With LDL/HDL Ratio     Endocrine   Postsurgical hypothyroidism - Primary    -will check labs prior to next visit      Relevant Orders   TSH + free T4   Impaired fasting blood sugar    -check A1c with next set of labs      Relevant Orders   Hemoglobin A1c     Other   Hyperlipidemia    -checking labs with next visit      Relevant Medications   losartan (COZAAR) 50 MG tablet       Meds ordered this encounter  Medications  . losartan (COZAAR) 50 MG tablet    Sig: Take 1 tablet (50 mg total) by mouth daily.     Dispense:  90 tablet    Refill:  [place], NP

## 2021-03-16 ENCOUNTER — Other Ambulatory Visit: Payer: Self-pay

## 2021-03-16 ENCOUNTER — Ambulatory Visit (INDEPENDENT_AMBULATORY_CARE_PROVIDER_SITE_OTHER): Payer: Medicare Other

## 2021-03-16 DIAGNOSIS — Z0000 Encounter for general adult medical examination without abnormal findings: Secondary | ICD-10-CM

## 2021-03-16 DIAGNOSIS — Z0001 Encounter for general adult medical examination with abnormal findings: Secondary | ICD-10-CM | POA: Diagnosis not present

## 2021-03-16 NOTE — Patient Instructions (Signed)
Angelica Grimes , Thank you for taking time to come for your Medicare Wellness Visit. I appreciate your ongoing commitment to your health goals. Please review the following plan we discussed and let me know if I can assist you in the future.   Screening recommendations/referrals: Colonoscopy: No longer required Mammogram: Up to date, next due 07/12/2021 Bone Density: Up to date, next due 04/29/2025 Recommended yearly ophthalmology/optometry visit for glaucoma screening and checkup Recommended yearly dental visit for hygiene and checkup  Vaccinations: Influenza vaccine: Up to date, next due fall 2022  Pneumococcal vaccine: Completed series  Tdap vaccine: You may due, please let us know if you find record of your previous TDAP Shingles vaccine: Completed series     Advanced directives: Please bring in copies of your advanced directives so that we may scan into your chart  Conditions/risks identified: None   Next appointment: none    Preventive Care 65 Years and Older, Female Preventive care refers to lifestyle choices and visits with your health care provider that can promote health and wellness. What does preventive care include? A yearly physical exam. This is also called an annual well check. Dental exams once or twice a year. Routine eye exams. Ask your health care provider how often you should have your eyes checked. Personal lifestyle choices, including: Daily care of your teeth and gums. Regular physical activity. Eating a healthy diet. Avoiding tobacco and drug use. Limiting alcohol use. Practicing safe sex. Taking low-dose aspirin every day. Taking vitamin and mineral supplements as recommended by your health care provider. What happens during an annual well check? The services and screenings done by your health care provider during your annual well check will depend on your age, overall health, lifestyle risk factors, and family history of disease. Counseling  Your health  care provider may ask you questions about your: Alcohol use. Tobacco use. Drug use. Emotional well-being. Home and relationship well-being. Sexual activity. Eating habits. History of falls. Memory and ability to understand (cognition). Work and work Statistician. Reproductive health. Screening  You may have the following tests or measurements: Height, weight, and BMI. Blood pressure. Lipid and cholesterol levels. These may be checked every 5 years, or more frequently if you are over 22 years old. Skin check. Lung cancer screening. You may have this screening every year starting at age 36 if you have a 30-pack-year history of smoking and currently smoke or have quit within the past 15 years. Fecal occult blood test (FOBT) of the stool. You may have this test every year starting at age 58. Flexible sigmoidoscopy or colonoscopy. You may have a sigmoidoscopy every 5 years or a colonoscopy every 10 years starting at age 29. Hepatitis C blood test. Hepatitis B blood test. Sexually transmitted disease (STD) testing. Diabetes screening. This is done by checking your blood sugar (glucose) after you have not eaten for a while (fasting). You may have this done every 1-3 years. Bone density scan. This is done to screen for osteoporosis. You may have this done starting at age 55. Mammogram. This may be done every 1-2 years. Talk to your health care provider about how often you should have regular mammograms. Talk with your health care provider about your test results, treatment options, and if necessary, the need for more tests. Vaccines  Your health care provider may recommend certain vaccines, such as: Influenza vaccine. This is recommended every year. Tetanus, diphtheria, and acellular pertussis (Tdap, Td) vaccine. You may need a Td booster every 10 years. Zoster  vaccine. You may need this after age 93. Pneumococcal 13-valent conjugate (PCV13) vaccine. One dose is recommended after age  20. Pneumococcal polysaccharide (PPSV23) vaccine. One dose is recommended after age 77. Talk to your health care provider about which screenings and vaccines you need and how often you need them. This information is not intended to replace advice given to you by your health care provider. Make sure you discuss any questions you have with your health care provider. Document Released: 10/15/2015 Document Revised: 06/07/2016 Document Reviewed: 07/20/2015 Elsevier Interactive Patient Education  2017 [employer] Prevention in the Home Falls can cause injuries. They can happen to people of all ages. There are many things you can do to make your home safe and to help prevent falls. What can I do on the outside of my home? Regularly fix the edges of walkways and driveways and fix any cracks. Remove anything that might make you trip as you walk through a door, such as a raised step or threshold. Trim any bushes or trees on the path to your home. Use bright outdoor lighting. Clear any walking paths of anything that might make someone trip, such as rocks or tools. Regularly check to see if handrails are loose or broken. Make sure that both sides of any steps have handrails. Any raised decks and porches should have guardrails on the edges. Have any leaves, snow, or ice cleared regularly. Use sand or salt on walking paths during winter. Clean up any spills in your garage right away. This includes oil or grease spills. What can I do in the bathroom? Use night lights. Install grab bars by the toilet and in the tub and shower. Do not use towel bars as grab bars. Use non-skid mats or decals in the tub or shower. If you need to sit down in the shower, use a plastic, non-slip stool. Keep the floor dry. Clean up any water that spills on the floor as soon as it happens. Remove soap buildup in the tub or shower regularly. Attach bath mats securely with double-sided non-slip rug tape. Do not have throw  rugs and other things on the floor that can make you trip. What can I do in the bedroom? Use night lights. Make sure that you have a light by your bed that is easy to reach. Do not use any sheets or blankets that are too big for your bed. They should not hang down onto the floor. Have a firm chair that has side arms. You can use this for support while you get dressed. Do not have throw rugs and other things on the floor that can make you trip. What can I do in the kitchen? Clean up any spills right away. Avoid walking on wet floors. Keep items that you use a lot in easy-to-reach places. If you need to reach something above you, use a strong step stool that has a grab bar. Keep electrical cords out of the way. Do not use floor polish or wax that makes floors slippery. If you must use wax, use non-skid floor wax. Do not have throw rugs and other things on the floor that can make you trip. What can I do with my stairs? Do not leave any items on the stairs. Make sure that there are handrails on both sides of the stairs and use them. Fix handrails that are broken or loose. Make sure that handrails are as long as the stairways. Check any carpeting to make sure that it  is firmly attached to the stairs. Fix any carpet that is loose or worn. Avoid having throw rugs at the top or bottom of the stairs. If you do have throw rugs, attach them to the floor with carpet tape. Make sure that you have a light switch at the top of the stairs and the bottom of the stairs. If you do not have them, ask someone to add them for you. What else can I do to help prevent falls? Wear shoes that: Do not have high heels. Have rubber bottoms. Are comfortable and fit you well. Are closed at the toe. Do not wear sandals. If you use a stepladder: Make sure that it is fully opened. Do not climb a closed stepladder. Make sure that both sides of the stepladder are locked into place. Ask someone to hold it for you, if  possible. Clearly mark and make sure that you can see: Any grab bars or handrails. First and last steps. Where the edge of each step is. Use tools that help you move around (mobility aids) if they are needed. These include: Canes. Walkers. Scooters. Crutches. Turn on the lights when you go into a dark area. Replace any light bulbs as soon as they burn out. Set up your furniture so you have a clear path. Avoid moving your furniture around. If any of your floors are uneven, fix them. If there are any pets around you, be aware of where they are. Review your medicines with your doctor. Some medicines can make you feel dizzy. This can increase your chance of falling. Ask your doctor what other things that you can do to help prevent falls. This information is not intended to replace advice given to you by your health care provider. Make sure you discuss any questions you have with your health care provider. Document Released: 07/15/2009 Document Revised: 02/24/2016 Document Reviewed: 10/23/2014 Elsevier Interactive Patient Education  2017 [employer].

## 2021-03-16 NOTE — Progress Notes (Addendum)
Subjective:   Angelica Grimes is a 86 y.o. female who presents for Medicare Annual (Subsequent) preventive examination.  I connected with Angelica Grimes today by telephone and verified that I am speaking with the correct person using two identifiers. Location patient: home Location provider: work Persons participating in the virtual visit: patient, provider.   I discussed the limitations, risks, security and privacy concerns of performing an evaluation and management service by telephone and the availability of in person appointments. I also discussed with the patient that there may be a patient responsible charge related to this service. The patient expressed understanding and verbally consented to this telephonic visit.    Interactive audio and video telecommunications were attempted between this provider and patient, however failed, due to patient having technical difficulties OR patient did not have access to video capability.  We continued and completed visit with audio only.     Review of Systems    N/A  Cardiac Risk Factors include: advanced age (>19men, >41 women);hypertension;dyslipidemia     Objective:    There were no vitals filed for this visit. There is no height or weight on file to calculate BMI.  Advanced Directives 03/16/2021 05/19/2019 06/18/2018 11/01/2017 10/18/2017 05/01/2017 10/03/2016  Does Patient Have a Medical Advance Directive? Yes No;Yes Yes Yes Yes Yes No;Yes  Type of Paramedic of [place];Living will [place];Living will [place];Living will [place];Living will [place];Living will [place];Living will [place];Living will  Does patient want to make changes to medical advance directive? No - Patient declined - No - Patient declined - No - Patient declined - No - Patient declined  Copy of [place] in  Chart? No - copy requested No - copy requested No - copy requested No - copy requested No - copy requested No - copy requested No - copy requested  Would patient like information on creating a medical advance directive? - No - Patient declined - - - - No - Patient declined  Pre-existing out of facility DNR order (yellow form or pink MOST form) - - - - - - -    Current Medications (verified) Outpatient Encounter Medications as of 03/16/2021  Medication Sig   carvedilol (COREG) 25 MG tablet TAKE 1 TABLET BY MOUTH TWICE DAILY AS DIRECTED   co-enzyme Q-10 50 MG capsule Take 100 mg by mouth daily.   Cyanocobalamin (VITAMIN B 12 PO) Take 1,000 mcg by mouth.   Glucosamine-Chondroitin (OSTEO BI-FLEX REGULAR STRENGTH PO) Take 2 tablets by mouth daily.   hydrochlorothiazide (HYDRODIURIL) 25 MG tablet Take 25 mg by mouth. As needed for swelling   losartan (COZAAR) 50 MG tablet Take 1 tablet (50 mg total) by mouth daily.   omeprazole (PRILOSEC) 20 MG capsule TAKE ONE CAPSULE BY MOUTH DAILY   rosuvastatin (CRESTOR) 5 MG tablet Take 1 tablet (5 mg total) by mouth daily.   SYNTHROID 88 MCG tablet TAKE 1 TABLET BY MOUTH EVERY DAY   zinc gluconate 50 MG tablet Take 50 mg by mouth daily.   ALPRAZolam (XANAX) 0.25 MG tablet Take 0.25 mg by mouth as needed. anxiety (Patient not taking: Reported on 03/16/2021)   amLODipine (NORVASC) 2.5 MG tablet Take 1 tablet (2.5 mg total) by mouth daily. (Patient not taking: Reported on 03/16/2021)   [DISCONTINUED] CALCIUM-MAGNESIUM-ZINC PO Take 2 tablets by mouth daily.   [DISCONTINUED] FLUAD 0.5 ML SUSY Inject as directed See  admin instructions.   No facility-administered encounter medications on file as of 03/16/2021.    Allergies (verified) Shrimp [shellfish allergy], Aspirin, Benazepril, Other, Penicillins, Clonidine derivatives, and Orange fruit [citrus]   History: Past Medical History:  Diagnosis Date   Anxiety    Brain tumor (benign) ([place])    Cancer ([place])     breast   Cancer ([place])    thyroid   Fracture ankle fracture 01/24/16   GERD (gastroesophageal reflux disease)    Hyperlipidemia    Hypertension    Hypothyroidism    IBS (irritable bowel syndrome)    Low iron stores 11/08/2016   Positive colorectal cancer screening using Cologuard test 05/01/2018   Past Surgical History:  Procedure Laterality Date   breast cyst surgery      x 2 - benign    BREAST LUMPECTOMY     right breast with re-excision   BREAST SURGERY  1966, 1991, 1998   biopsy   BUNIONECTOMY  2012   right foot   COLONOSCOPY     [place]   due to cancer   TONSILLECTOMY     Family History  Problem Relation Age of Onset   Stroke Mother    Pneumonia Mother    Cancer Father        bladder, prostate   Pneumonia Father    Cancer Sister        kidney   Cancer Brother        lung   Diabetes Sister    Sarcoidosis Sister    Cancer Half-Sister        kidney cancer   Cancer Half-Sister        lung cancer   Atrial fibrillation Brother    Atrial fibrillation Brother    Atrial fibrillation Brother    Colon cancer Neg Hx    Social History   Socioeconomic History   Marital status: Widowed    Spouse name: Not on file   Number of children: 2   Years of education: 47   Highest education level: Not on file  Occupational History   Not on file  Tobacco Use   Smoking status: Never   Smokeless tobacco: Never  Vaping Use   Vaping Use: Never used  Substance and Sexual Activity   Alcohol use: Yes    Comment: rare   Drug use: No   Sexual activity: Not Currently  Other Topics Concern   Not on file  Social History Narrative   Lives with grandson      Widow   2 children   1: Angelica Grimes (has permission to get info) 2 children: 1 son and daughter (son lives with her)   2: [place] Son-Angelica Grimes 1 son      No pets      Diet: eats what she wants   Caffeine: none,  Maybe a coke at times   Water: 3-4 bottles of 16 oz      Wear seat belt    Wear sun protection if needed   Oceanographer and Security system    Does not use phone in car         Social Determinants of Health   Financial Resource Strain: Low Risk    Difficulty of Paying Living Expenses: Not hard at all  Food Insecurity: No Food Insecurity   Worried About Charity fundraiser in the Last Year: Never true   [place] in the Last Year:  Never true  Transportation Needs: No Transportation Needs   Lack of Transportation (Medical): No   Lack of Transportation (Non-Medical): No  Physical Activity: Inactive   Days of Exercise per Week: 0 days   Minutes of Exercise per Session: 0 min  Stress: No Stress Concern Present   Feeling of Stress : Not at all  Social Connections: Moderately Isolated   Frequency of Communication with Friends and Family: More than three times a week   Frequency of Social Gatherings with Friends and Family: Three times a week   Attends Religious Services: More than 4 times per year   Active Member of Clubs or Organizations: No   Attends Archivist Meetings: Never   Marital Status: Widowed    Tobacco Counseling Counseling given: Not Answered   Clinical Intake:  Pre-visit preparation completed: Yes  Pain : No/denies pain     Nutritional Risks: None Diabetes: No  How often do you need to have someone help you when you read instructions, pamphlets, or other written materials from your doctor or pharmacy?: 1 - Never  Diabetic?No  Interpreter Needed?: No  Information entered by :: [place] of Daily Living In your present state of health, do you have any difficulty performing the following activities: 03/16/2021 04/13/2020  Hearing? N N  Vision? N N  Difficulty concentrating or making decisions? N N  Walking or climbing stairs? Tempie Donning  Comment gets sob when climbing stairs -  Dressing or bathing? N N  Doing errands, shopping? N Y  Conservation officer, nature and eating ? N -  Using the Toilet? N -  In the  past six months, have you accidently leaked urine? Y -  Do you have problems with loss of bowel control? N -  Managing your Medications? N -  Managing your Finances? N -  Housekeeping or managing your Housekeeping? N -  Some recent data might be hidden    Patient Care Team: Perlie Mayo, NP as PCP - General (Family Medicine) Harl Bowie, Alphonse Guild, MD as PCP - Cardiology (Cardiology) Gatha Mayer, MD as Consulting Physician (Radiation Oncology) Rolm Bookbinder, MD as Consulting Physician (General Surgery) Gari Crown, MD (Unknown Physician Specialty) Santo Held, MD (Unknown Physician Specialty) Danie Binder, MD (Inactive) as Consulting Physician (Gastroenterology) Danie Binder, MD (Inactive) as Consulting Physician (Gastroenterology)  Indicate any recent Medical Services you may have received from other than [facility] providers in the past year (date may be approximate).     Assessment:   This is a routine wellness examination for Yesli.  Hearing/Vision screen Vision Screening - Comments:: Patient states she gets eyes examined once per year. Has hx of cataracts surgery  Dietary issues and exercise activities discussed: Current Exercise Habits: The patient does not participate in regular exercise at present, Exercise limited by: None identified   Goals Addressed             This Visit's Progress    Blood Pressure < 140/90       Prevent falls         Depression Screen PHQ 2/9 Scores 03/16/2021 02/24/2021 09/01/2020 04/13/2020 08/06/2019  PHQ - 2 Score 0 0 0 0 1  PHQ- 9 Score - - - - 1    Fall Risk Fall Risk  03/16/2021 02/24/2021 09/01/2020 04/13/2020 08/06/2019  Falls in the past year? 1 0 0 0 0  Comment patient fell out of the bed while rolling over - - - -  Number falls in  past yr: 0 0 0 0 0  Injury with Fall? 0 0 0 0 0  Risk for fall due to : No Fall Risks No Fall Risks No Fall Risks No Fall Risks -  Follow up Falls evaluation completed;Falls prevention  discussed Falls evaluation completed Falls evaluation completed Falls evaluation completed -    FALL RISK PREVENTION PERTAINING TO THE HOME:  Any stairs in or around the home? Yes  If so, are there any without handrails? No  Home free of loose throw rugs in walkways, pet beds, electrical cords, etc? Yes  Adequate lighting in your home to reduce risk of falls? Yes   ASSISTIVE DEVICES UTILIZED TO PREVENT FALLS:  Life alert? No  Use of a cane, walker or w/c? No  Grab bars in the bathroom? Yes  Shower chair or bench in shower? No  Elevated toilet seat or a handicapped toilet? No     Cognitive Function:  Normal cognitive status assessed by direct observation by this Nurse Health Advisor. No abnormalities found.        Immunizations Immunization History  Administered Date(s) Administered   Fluad Quad(high Dose 65+) 07/05/2020   Influenza Split 06/19/2012   Influenza,inj,Quad PF,6+ Mos 07/02/2018   Influenza,inj,quad, With Preservative 06/27/2017   Influenza-Unspecified 08/01/2015   Moderna Sars-Covid-2 Vaccination 01/07/2020, 02/03/2020   Zoster Recombinat (Shingrix) 01/29/2018    TDAP status: Due, Education has been provided regarding the importance of this vaccine. Advised may receive this vaccine at local pharmacy or Health Dept. Aware to provide a copy of the vaccination record if obtained from local pharmacy or Health Dept. Verbalized acceptance and understanding.  Flu Vaccine status: Up to date  Pneumococcal vaccine status: Due, Education has been provided regarding the importance of this vaccine. Advised may receive this vaccine at local pharmacy or Health Dept. Aware to provide a copy of the vaccination record if obtained from local pharmacy or Health Dept. Verbalized acceptance and understanding.  Covid-19 vaccine status: Completed vaccines  Qualifies for Shingles Vaccine? Yes   Zostavax completed No   Shingrix Completed?: Yes  Screening Tests Health Maintenance   Topic Date Due   TETANUS/TDAP  Never done   PNA vac Low Risk Adult (1 of 2 - PCV13) Never done   Zoster Vaccines- Shingrix (2 of 2) 03/26/2018   COVID-19 Vaccine (3 - Moderna risk series) 03/02/2020   INFLUENZA VACCINE  05/02/2021   DEXA SCAN  Completed   HPV VACCINES  Aged Out    Health Maintenance  Health Maintenance Due  Topic Date Due   TETANUS/TDAP  Never done   PNA vac Low Risk Adult (1 of 2 - PCV13) Never done   Zoster Vaccines- Shingrix (2 of 2) 03/26/2018   COVID-19 Vaccine (3 - Moderna risk series) 03/02/2020    Colorectal cancer screening: No longer required.   Mammogram status: Completed 07/12/2020. Repeat every year  Bone Density status: Completed 04/29/2020. Results reflect: Bone density results: OSTEOPENIA. Repeat every 5 years.  Lung Cancer Screening: (Low Dose CT Chest recommended if Age 13-80 years, 30 pack-year currently smoking OR have quit w/in 15years.) does not qualify.   Lung Cancer Screening Referral: N/A   Additional Screening:  Hepatitis C Screening: does not qualify;   Vision Screening: Recommended annual ophthalmology exams for early detection of glaucoma and other disorders of the eye. Is the patient up to date with their annual eye exam?  Yes  Who is the provider or what is the name of the office in which  the patient attends annual eye exams? [facility] [place]  If pt is not established with a provider, would they like to be referred to a provider to establish care? No .   Dental Screening: Recommended annual dental exams for proper oral hygiene  Community Resource Referral / Chronic Care Management: CRR required this visit?  No   CCM required this visit?  No      Plan:     I have personally reviewed and noted the following in the patient's chart:   Medical and social history Use of alcohol, tobacco or illicit drugs  Current medications and supplements including opioid prescriptions.  Functional ability and  status Nutritional status Physical activity Advanced directives List of other physicians Hospitalizations, surgeries, and ER visits in previous 12 months Vitals Screenings to include cognitive, depression, and falls Referrals and appointments  In addition, I have reviewed and discussed with patient certain preventive protocols, quality metrics, and best practice recommendations. A written personalized care plan for preventive services as well as general preventive health recommendations were provided to patient.     Ofilia Neas, LPN   [id number]   Nurse Notes: None

## 2021-03-25 ENCOUNTER — Other Ambulatory Visit: Payer: Self-pay | Admitting: Nurse Practitioner

## 2021-03-30 ENCOUNTER — Other Ambulatory Visit: Payer: Self-pay | Admitting: *Deleted

## 2021-03-30 MED ORDER — CARVEDILOL 25 MG PO TABS
25.0000 mg | ORAL_TABLET | Freq: Two times a day (BID) | ORAL | 0 refills | Status: DC
Start: 2021-03-30 — End: 2021-06-20

## 2021-04-12 LAB — CBC WITH DIFFERENTIAL/PLATELET
Basophils Absolute: 0 10*3/uL (ref 0.0–0.2)
Basos: 1 %
EOS (ABSOLUTE): 0.1 10*3/uL (ref 0.0–0.4)
Eos: 2 %
Hematocrit: 34.4 % (ref 34.0–46.6)
Hemoglobin: 11.7 g/dL (ref 11.1–15.9)
Immature Grans (Abs): 0 10*3/uL (ref 0.0–0.1)
Immature Granulocytes: 0 %
Lymphocytes Absolute: 1.4 10*3/uL (ref 0.7–3.1)
Lymphs: 26 %
MCH: 28.6 pg (ref 26.6–33.0)
MCHC: 34 g/dL (ref 31.5–35.7)
MCV: 84 fL (ref 79–97)
Monocytes Absolute: 0.4 10*3/uL (ref 0.1–0.9)
Monocytes: 7 %
Neutrophils Absolute: 3.5 10*3/uL (ref 1.4–7.0)
Neutrophils: 64 %
Platelets: 162 10*3/uL (ref 150–450)
RBC: 4.09 x10E6/uL (ref 3.77–5.28)
RDW: 13.2 % (ref 11.7–15.4)
WBC: 5.5 10*3/uL (ref 3.4–10.8)

## 2021-04-12 LAB — CMP14+EGFR
ALT: 16 IU/L (ref 0–32)
AST: 16 IU/L (ref 0–40)
Albumin/Globulin Ratio: 2.4 — ABNORMAL HIGH (ref 1.2–2.2)
Albumin: 4.4 g/dL (ref 3.6–4.6)
Alkaline Phosphatase: 61 IU/L (ref 44–121)
BUN/Creatinine Ratio: 11 — ABNORMAL LOW (ref 12–28)
BUN: 8 mg/dL (ref 8–27)
Bilirubin Total: 0.6 mg/dL (ref 0.0–1.2)
CO2: 25 mmol/L (ref 20–29)
Calcium: 8.6 mg/dL — ABNORMAL LOW (ref 8.7–10.3)
Chloride: 103 mmol/L (ref 96–106)
Creatinine, Ser: 0.75 mg/dL (ref 0.57–1.00)
Globulin, Total: 1.8 g/dL (ref 1.5–4.5)
Glucose: 101 mg/dL — ABNORMAL HIGH (ref 65–99)
Potassium: 3.3 mmol/L — ABNORMAL LOW (ref 3.5–5.2)
Sodium: 142 mmol/L (ref 134–144)
Total Protein: 6.2 g/dL (ref 6.0–8.5)
eGFR: 79 mL/min/{1.73_m2} (ref >59)

## 2021-04-12 LAB — LIPID PANEL WITH LDL/HDL RATIO
Cholesterol, Total: 128 mg/dL (ref 100–199)
HDL: 45 mg/dL (ref >39)
LDL Chol Calc (NIH): 65 mg/dL (ref 0–99)
LDL/HDL Ratio: 1.4 ratio (ref 0.0–3.2)
Triglycerides: 95 mg/dL (ref 0–149)
VLDL Cholesterol Cal: 18 mg/dL (ref 5–40)

## 2021-04-12 LAB — HEMOGLOBIN A1C
Est. average glucose Bld gHb Est-mCnc: 126 mg/dL
Hgb A1c MFr Bld: 6 % — ABNORMAL HIGH (ref 4.8–5.6)

## 2021-04-12 LAB — TSH+FREE T4
Free T4: 2.47 ng/dL — ABNORMAL HIGH (ref 0.82–1.77)
TSH: 0.74 u[IU]/mL (ref 0.450–4.500)

## 2021-04-12 NOTE — Progress Notes (Signed)
We will discuss labs on 7/18, but labs are good overall. T4, a thyroid hormone, was elevated, but we will talk about that at the appointment.

## 2021-04-13 ENCOUNTER — Ambulatory Visit: Payer: Medicare Other | Admitting: Nurse Practitioner

## 2021-04-18 ENCOUNTER — Encounter: Payer: Self-pay | Admitting: Nurse Practitioner

## 2021-04-18 ENCOUNTER — Ambulatory Visit: Payer: Medicare Other

## 2021-04-18 ENCOUNTER — Other Ambulatory Visit: Payer: Self-pay

## 2021-04-18 ENCOUNTER — Ambulatory Visit (INDEPENDENT_AMBULATORY_CARE_PROVIDER_SITE_OTHER): Payer: Medicare Other | Admitting: Nurse Practitioner

## 2021-04-18 VITALS — BP 150/73 | HR 58 | Temp 97.3°F | Ht 64.5 in | Wt 144.0 lb

## 2021-04-18 DIAGNOSIS — I10 Essential (primary) hypertension: Secondary | ICD-10-CM

## 2021-04-18 DIAGNOSIS — E890 Postprocedural hypothyroidism: Secondary | ICD-10-CM | POA: Diagnosis not present

## 2021-04-18 DIAGNOSIS — M7989 Other specified soft tissue disorders: Secondary | ICD-10-CM | POA: Insufficient documentation

## 2021-04-18 DIAGNOSIS — Z0000 Encounter for general adult medical examination without abnormal findings: Secondary | ICD-10-CM

## 2021-04-18 MED ORDER — LOSARTAN POTASSIUM 50 MG PO TABS: 50.0000 mg | ORAL_TABLET | Freq: Two times a day (BID) | ORAL | 1 refills | Status: DC

## 2021-04-18 MED ORDER — LEVOTHYROXINE SODIUM 75 MCG PO TABS: 75.0000 ug | ORAL_TABLET | Freq: Every day | ORAL | 1 refills | Status: DC

## 2021-04-18 NOTE — Assessment & Plan Note (Signed)
Lab Results  Component Value Date   TSH 0.740 04/11/2021   -T4 was elevated -DECREASE synthroid to 75 mcg from 88 mcg -recheck in 1 month

## 2021-04-18 NOTE — Patient Instructions (Signed)
Please have labs drawn 2-3 days prior to your appointment so we can discuss the results during your office visit.  

## 2021-04-18 NOTE — Assessment & Plan Note (Addendum)
-  take HCTZ as needed -if there is pitting edema, take the HCTZ -may need cardiology referral to r/o CHF; will consider this at next OV

## 2021-04-18 NOTE — Progress Notes (Signed)
AWV and lab follow-up  Acute Office Visit  Subjective:    Patient ID: Angelica Grimes, female    DOB: [birth year], 86 y.o.   MRN: [id number]  Chief Complaint  Patient presents with   Follow-up    Recent labs.    HPI Patient is in today for AWV and lab follow-up. She states BP has been elevated 160-170s/90s without medicine. After she takes her medicine, her BP drops to 150s/70s. She has been taking carvedilol, losartan, and HCTZ (PRN for leg swelling). She took amlodipine in the past, but it caused leg swelling.  Past Medical History:  Diagnosis Date   Anxiety    Brain tumor (benign) ([place])    Cancer ([place])    breast   Cancer ([place])    thyroid   Fracture ankle fracture 01/24/16   GERD (gastroesophageal reflux disease)    Hyperlipidemia    Hypertension    Hypothyroidism    IBS (irritable bowel syndrome)    Low iron stores 11/08/2016   Positive colorectal cancer screening using Cologuard test 05/01/2018    Past Surgical History:  Procedure Laterality Date   breast cyst surgery      x 2 - benign    BREAST LUMPECTOMY     right breast with re-excision   BREAST SURGERY  1966, 1991, 1998   biopsy   BUNIONECTOMY  2012   right foot   COLONOSCOPY     [place]   due to cancer   TONSILLECTOMY      Family History  Problem Relation Age of Onset   Stroke Mother    Pneumonia Mother    Cancer Father        bladder, prostate   Pneumonia Father    Cancer Sister        kidney   Cancer Brother        lung   Diabetes Sister    Sarcoidosis Sister    Cancer Half-Sister        kidney cancer   Cancer Half-Sister        lung cancer   Atrial fibrillation Brother    Atrial fibrillation Brother    Atrial fibrillation Brother    Colon cancer Neg Hx     Social History   Socioeconomic History   Marital status: Widowed    Spouse name: Not on file   Number of children: 2   Years of education: 31   Highest education level: Not on file  Occupational  History   Not on file  Tobacco Use   Smoking status: Never   Smokeless tobacco: Never  Vaping Use   Vaping Use: Never used  Substance and Sexual Activity   Alcohol use: Yes    Comment: rare   Drug use: No   Sexual activity: Not Currently  Other Topics Concern   Not on file  Social History Narrative   Lives with grandson      Widow   2 children   1: [place] Daughter-Letitia (has permission to get info) 2 children: 1 son and daughter (son lives with her)   2: [facility] Son-[name] 1 son      No pets      Diet: eats what she wants   Caffeine: none,  Maybe a coke at times   Water: 3-4 bottles of 16 oz      Wear seat belt   Wear sun protection if needed   Oceanographer and Security system  Does not use phone in car         Social Determinants of Health   Financial Resource Strain: Low Risk    Difficulty of Paying Living Expenses: Not hard at all  Food Insecurity: No Food Insecurity   Worried About Charity fundraiser in the Last Year: Never true   Arboriculturist in the Last Year: Never true  Transportation Needs: No Transportation Needs   Lack of Transportation (Medical): No   Lack of Transportation (Non-Medical): No  Physical Activity: Insufficiently Active   Days of Exercise per Week: 7 days   Minutes of Exercise per Session: 20 min  Stress: No Stress Concern Present   Feeling of Stress : Only a little  Social Connections: Moderately Isolated   Frequency of Communication with Friends and Family: More than three times a week   Frequency of Social Gatherings with Friends and Family: More than three times a week   Attends Religious Services: More than 4 times per year   Active Member of [employer] or Organizations: No   Attends Archivist Meetings: Never   Marital Status: Widowed  Human resources officer Violence: Not At Risk   Fear of Current or Ex-Partner: No   Emotionally Abused: No   Physically Abused: No   Sexually Abused: No    Outpatient Medications Prior to  Visit  Medication Sig Dispense Refill   ALPRAZolam (XANAX) 0.25 MG tablet Take 0.25 mg by mouth as needed. anxiety     amLODipine (NORVASC) 2.5 MG tablet Take 1 tablet (2.5 mg total) by mouth daily. 90 tablet 3   carvedilol (COREG) 25 MG tablet Take 1 tablet (25 mg total) by mouth 2 (two) times daily. as directed 180 tablet 0   co-enzyme Q-10 50 MG capsule Take 100 mg by mouth daily.     Cyanocobalamin (VITAMIN B 12 PO) Take 1,000 mcg by mouth.     Glucosamine-Chondroitin (OSTEO BI-FLEX REGULAR STRENGTH PO) Take 2 tablets by mouth daily.     hydrochlorothiazide (HYDRODIURIL) 25 MG tablet Take 25 mg by mouth. As needed for swelling     losartan (COZAAR) 50 MG tablet Take 1 tablet (50 mg total) by mouth 2 (two) times daily. 60 tablet 1   omeprazole (PRILOSEC) 20 MG capsule TAKE ONE CAPSULE BY MOUTH DAILY 90 capsule 0   rosuvastatin (CRESTOR) 5 MG tablet Take 1 tablet (5 mg total) by mouth daily. 90 tablet 1   zinc gluconate 50 MG tablet Take 50 mg by mouth daily.     losartan (COZAAR) 50 MG tablet Take 1 tablet (50 mg total) by mouth daily. 90 tablet 1   SYNTHROID 88 MCG tablet TAKE 1 TABLET BY MOUTH EVERY DAY 30 tablet 3   No facility-administered medications prior to visit.    Allergies  Allergen Reactions   Shrimp [Shellfish Allergy] Hives    Shrimp only   Aspirin Nausea Only and Other (See Comments)    Tightness of chest   Benazepril Swelling    Entire face, lips, throat   Other    Penicillins Hives, Itching and Rash    Abdominal area only   Clonidine Derivatives Other (See Comments)    Dryness of mouth, lowers blood pressure and pulse    Orange Fruit [Citrus] Rash    Fever blisters and blisters on knuckles     Review of Systems  Constitutional: Negative.   Respiratory: Negative.    Cardiovascular:  Positive for leg swelling.  Psychiatric/Behavioral: Negative.  Objective:    Physical Exam Constitutional:      Appearance: Normal appearance.  Cardiovascular:      Rate and Rhythm: Normal rate and regular rhythm.     Pulses: Normal pulses.     Heart sounds: Normal heart sounds.     Comments: 1+ pitting edema Pulmonary:     Effort: Pulmonary effort is normal.     Breath sounds: Normal breath sounds.  Neurological:     Mental Status: She is alert.  Psychiatric:        Mood and Affect: Mood normal.        Behavior: Behavior normal.        Thought Content: Thought content normal.        Judgment: Judgment normal.    BP (!) 150/73 (BP Location: Right Arm, Patient Position: Sitting, Cuff Size: Large)   Pulse (!) 58   Temp (!) 97.3 F (36.3 C) (Temporal)   Ht 5' 4.5" (1.638 m)   Wt 144 lb (65.3 kg)   SpO2 98%   BMI 24.34 kg/m  Wt Readings from Last 3 Encounters:  04/18/21 144 lb (65.3 kg)  04/18/21 144 lb (65.3 kg)  02/24/21 152 lb (68.9 kg)    Health Maintenance Due  Topic Date Due   TETANUS/TDAP  Never done   PNA vac Low Risk Adult (1 of 2 - PCV13) Never done   Zoster Vaccines- Shingrix (2 of 2) 03/26/2018   COVID-19 Vaccine (3 - Moderna risk series) 03/02/2020    There are no preventive care reminders to display for this patient.   Lab Results  Component Value Date   TSH 0.740 04/11/2021   Lab Results  Component Value Date   WBC 5.5 04/11/2021   HGB 11.7 04/11/2021   HCT 34.4 04/11/2021   MCV 84 04/11/2021   PLT 162 04/11/2021   Lab Results  Component Value Date   NA 142 04/11/2021   K 3.3 (L) 04/11/2021   CO2 25 04/11/2021   GLUCOSE 101 (H) 04/11/2021   BUN 8 04/11/2021   CREATININE 0.75 04/11/2021   BILITOT 0.6 04/11/2021   ALKPHOS 61 04/11/2021   AST 16 04/11/2021   ALT 16 04/11/2021   PROT 6.2 04/11/2021   ALBUMIN 4.4 04/11/2021   CALCIUM 8.6 (L) 04/11/2021   ANIONGAP 7 05/08/2019   EGFR 79 04/11/2021   Lab Results  Component Value Date   CHOL 128 04/11/2021   Lab Results  Component Value Date   HDL 45 04/11/2021   Lab Results  Component Value Date   LDLCALC 65 04/11/2021   Lab Results   Component Value Date   TRIG 95 04/11/2021   Lab Results  Component Value Date   CHOLHDL 3.8 04/13/2020   Lab Results  Component Value Date   HGBA1C 6.0 (H) 04/11/2021       Assessment & Plan:   Problem List Items Addressed This Visit       Cardiovascular and Mediastinum   Hypertension - Primary    -INCREASE losartan to 50 mg BID (from daily) -continue HCTZ as needed for leg swelling (may make this daily) -f/u in 1 month       Relevant Orders   CMP14+EGFR     Endocrine   Postsurgical hypothyroidism    Lab Results  Component Value Date   TSH 0.740 04/11/2021  -T4 was elevated -DECREASE synthroid to 75 mcg from 88 mcg -recheck in 1 month      Relevant Medications   levothyroxine (  SYNTHROID) 75 MCG tablet   Other Relevant Orders   TSH + free T4     Other   Leg swelling    -take HCTZ as needed -if there is pitting edema, take the HCTZ -may need cardiology referral to r/o CHF; will consider this at next OV         Meds ordered this encounter  Medications   levothyroxine (SYNTHROID) 75 MCG tablet    Sig: Take 1 tablet (75 mcg total) by mouth daily.    Dispense:  30 tablet    Refill:  1    She only takes branded synthroid.     Noreene Larsson, NP

## 2021-04-18 NOTE — Assessment & Plan Note (Signed)
-  INCREASE losartan to 50 mg BID (from daily) -continue HCTZ as needed for leg swelling (may make this daily) -f/u in 1 month

## 2021-04-18 NOTE — Progress Notes (Signed)
Subjective:   Angelica Grimes is a 86 y.o. female who presents for Medicare Annual (Subsequent) preventive examination.  Review of Systems       Objective:    There were no vitals filed for this visit. There is no height or weight on file to calculate BMI.  Advanced Directives 03/16/2021 05/19/2019 06/18/2018 11/01/2017 10/18/2017 05/01/2017 10/03/2016  Does Patient Have a Medical Advance Directive? Yes No;Yes Yes Yes Yes Yes No;Yes  Type of Paramedic of [place];Living will [place];Living will [place];Living will [place];Living will [place];Living will [place];Living will [place];Living will  Does patient want to make changes to medical advance directive? No - Patient declined - No - Patient declined - No - Patient declined - No - Patient declined  Copy of [place] in Chart? No - copy requested No - copy requested No - copy requested No - copy requested No - copy requested No - copy requested No - copy requested  Would patient like information on creating a medical advance directive? - No - Patient declined - - - - No - Patient declined  Pre-existing out of facility DNR order (yellow form or pink MOST form) - - - - - - -    Current Medications (verified) Outpatient Encounter Medications as of 04/18/2021  Medication Sig   ALPRAZolam (XANAX) 0.25 MG tablet Take 0.25 mg by mouth as needed. anxiety (Patient not taking: Reported on 03/16/2021)   amLODipine (NORVASC) 2.5 MG tablet Take 1 tablet (2.5 mg total) by mouth daily. (Patient not taking: Reported on 03/16/2021)   carvedilol (COREG) 25 MG tablet Take 1 tablet (25 mg total) by mouth 2 (two) times daily. as directed   co-enzyme Q-10 50 MG capsule Take 100 mg by mouth daily.   Cyanocobalamin (VITAMIN B 12 PO) Take 1,000 mcg by mouth.   Glucosamine-Chondroitin (OSTEO BI-FLEX  REGULAR STRENGTH PO) Take 2 tablets by mouth daily.   hydrochlorothiazide (HYDRODIURIL) 25 MG tablet Take 25 mg by mouth. As needed for swelling   losartan (COZAAR) 50 MG tablet Take 1 tablet (50 mg total) by mouth daily.   omeprazole (PRILOSEC) 20 MG capsule TAKE ONE CAPSULE BY MOUTH DAILY   rosuvastatin (CRESTOR) 5 MG tablet Take 1 tablet (5 mg total) by mouth daily.   SYNTHROID 88 MCG tablet TAKE 1 TABLET BY MOUTH EVERY DAY   zinc gluconate 50 MG tablet Take 50 mg by mouth daily.   No facility-administered encounter medications on file as of 04/18/2021.    Allergies (verified) Shrimp [shellfish allergy], Aspirin, Benazepril, Other, Penicillins, Clonidine derivatives, and Orange fruit [citrus]   History: Past Medical History:  Diagnosis Date   Anxiety    Brain tumor (benign) ([place])    Cancer ([place])    breast   Cancer ([place])    thyroid   Fracture ankle fracture 01/24/16   GERD (gastroesophageal reflux disease)    Hyperlipidemia    Hypertension    Hypothyroidism    IBS (irritable bowel syndrome)    Low iron stores 11/08/2016   Positive colorectal cancer screening using Cologuard test 05/01/2018   Past Surgical History:  Procedure Laterality Date   breast cyst surgery      x 2 - benign    BREAST LUMPECTOMY     right breast with re-excision   [place]   biopsy   BUNIONECTOMY  2012  right foot   COLONOSCOPY     [place]   due to cancer   TONSILLECTOMY     Family History  Problem Relation Age of Onset   Stroke Mother    Pneumonia Mother    Cancer Father        bladder, prostate   Pneumonia Father    Cancer Sister        kidney   Cancer Brother        lung   Diabetes Sister    Sarcoidosis Sister    Cancer Half-Sister        kidney cancer   Cancer Half-Sister        lung cancer   Atrial fibrillation Brother    Atrial fibrillation Brother    Atrial fibrillation Brother    Colon cancer Neg Hx    Social  History   Socioeconomic History   Marital status: Widowed    Spouse name: Not on file   Number of children: 2   Years of education: 61   Highest education level: Not on file  Occupational History   Not on file  Tobacco Use   Smoking status: Never   Smokeless tobacco: Never  Vaping Use   Vaping Use: Never used  Substance and Sexual Activity   Alcohol use: Yes    Comment: rare   Drug use: No   Sexual activity: Not Currently  Other Topics Concern   Not on file  Social History Narrative   Lives with grandson      Widow   2 children   1: [place] Daughter-Letitia (has permission to get info) 2 children: 1 son and daughter (son lives with her)   2: [place] Son-[name] 1 son      No pets      Diet: eats what she wants   Caffeine: none,  Maybe a coke at times   Water: 3-4 bottles of 16 oz      Wear seat belt   Wear sun protection if needed   Oceanographer and Security system    Does not use phone in car         Social Determinants of Health   Financial Resource Strain: Low Risk    Difficulty of Paying Living Expenses: Not hard at all  Food Insecurity: No Food Insecurity   Worried About Charity fundraiser in the Last Year: Never true   Arboriculturist in the Last Year: Never true  Transportation Needs: No Transportation Needs   Lack of Transportation (Medical): No   Lack of Transportation (Non-Medical): No  Physical Activity: Inactive   Days of Exercise per Week: 0 days   Minutes of Exercise per Session: 0 min  Stress: No Stress Concern Present   Feeling of Stress : Not at all  Social Connections: Moderately Isolated   Frequency of Communication with Friends and Family: More than three times a week   Frequency of Social Gatherings with Friends and Family: Three times a week   Attends Religious Services: More than 4 times per year   Active Member of Clubs or Organizations: No   Attends Archivist Meetings: Never   Marital Status: Widowed    Tobacco  Counseling Counseling given: Not Answered   Clinical Intake:                 Diabetic? no         Activities of Daily Living  In your present state of health, do you have any difficulty performing the following activities: 03/16/2021  Hearing? N  Vision? N  Difficulty concentrating or making decisions? N  Walking or climbing stairs? Y  Comment gets sob when climbing stairs  Dressing or bathing? N  Doing errands, shopping? N  Preparing Food and eating ? N  Using the Toilet? N  In the past six months, have you accidently leaked urine? Y  Do you have problems with loss of bowel control? N  Managing your Medications? N  Managing your Finances? N  Housekeeping or managing your Housekeeping? N  Some recent data might be hidden    Patient Care Team: Perlie Mayo, NP as PCP - General (Family Medicine) Harl Bowie, Alphonse Guild, MD as PCP - Cardiology (Cardiology) Gatha Mayer, MD as Consulting Physician (Radiation Oncology) Rolm Bookbinder, MD as Consulting Physician (General Surgery) Gari Crown, MD (Unknown Physician Specialty) Santo Held, MD (Unknown Physician Specialty) Danie Binder, MD (Inactive) as Consulting Physician (Gastroenterology) Danie Binder, MD (Inactive) as Consulting Physician (Gastroenterology)  Indicate any recent Medical Services you may have received from other than [facility] providers in the past year (date may be approximate).     Assessment:   This is a routine wellness examination for Angelica Grimes.  Hearing/Vision screen No results found.  Dietary issues and exercise activities discussed:     Goals Addressed   None   Depression Screen PHQ 2/9 Scores 03/16/2021 02/24/2021 09/01/2020 04/13/2020 08/06/2019  PHQ - 2 Score 0 0 0 0 1  PHQ- 9 Score - - - - 1    Fall Risk Fall Risk  03/16/2021 02/24/2021 09/01/2020 04/13/2020 08/06/2019  Falls in the past year? 1 0 0 0 0  Comment patient fell out of the bed while rolling over - - - -  Number  falls in past yr: 0 0 0 0 0  Injury with Fall? 0 0 0 0 0  Risk for fall due to : No Fall Risks No Fall Risks No Fall Risks No Fall Risks -  Follow up Falls evaluation completed;Falls prevention discussed Falls evaluation completed Falls evaluation completed Falls evaluation completed -    FALL RISK PREVENTION PERTAINING TO THE HOME:  Any stairs in or around the home? Yes  If so, are there any without handrails? No  Home free of loose throw rugs in walkways, pet beds, electrical cords, etc? Yes  Adequate lighting in your home to reduce risk of falls? Yes   ASSISTIVE DEVICES UTILIZED TO PREVENT FALLS:  Life alert? No  Use of a cane, walker or w/c? No  Grab bars in the bathroom? Yes  Shower chair or bench in shower? Yes  Elevated toilet seat or a handicapped toilet? Yes   TIMED UP AND GO:  Was the test performed? No .  Length of time to ambulate: n/a   Cognitive Function:        Immunizations Immunization History  Administered Date(s) Administered   Fluad Quad(high Dose 65+) 07/05/2020   Influenza Split 06/19/2012   Influenza,inj,Quad PF,6+ Mos 07/02/2018   Influenza,inj,quad, With Preservative 06/27/2017   Influenza-Unspecified 08/01/2015   Moderna Sars-Covid-2 Vaccination 01/07/2020, 02/03/2020   Zoster Recombinat (Shingrix) 01/29/2018    TDAP status: Due, Education has been provided regarding the importance of this vaccine. Advised may receive this vaccine at local pharmacy or Health Dept. Aware to provide a copy of the vaccination record if obtained from local pharmacy or Health Dept. Verbalized acceptance and understanding.  Flu  Vaccine status: Up to date  Pneumococcal vaccine status: Up to date  Covid-19 vaccine status: Completed vaccines  Qualifies for Shingles Vaccine? Yes   Zostavax completed No   Shingrix Completed?: Yes  Screening Tests Health Maintenance  Topic Date Due   TETANUS/TDAP  Never done   PNA vac Low Risk Adult (1 of 2 - PCV13) Never done    Zoster Vaccines- Shingrix (2 of 2) 03/26/2018   COVID-19 Vaccine (3 - Moderna risk series) 03/02/2020   INFLUENZA VACCINE  05/02/2021   DEXA SCAN  Completed   HPV VACCINES  Aged Out    Health Maintenance  Health Maintenance Due  Topic Date Due   TETANUS/TDAP  Never done   PNA vac Low Risk Adult (1 of 2 - PCV13) Never done   Zoster Vaccines- Shingrix (2 of 2) 03/26/2018   COVID-19 Vaccine (3 - Moderna risk series) 03/02/2020    Colorectal cancer screening: No longer required.   Mammogram status: Completed 11/21. Repeat every year  Bone Density status: Completed 04/29/20. Results reflect: Bone density results: NORMAL. Repeat every 5 years.  Lung Cancer Screening: (Low Dose CT Chest recommended if Age 46-80 years, 30 pack-year currently smoking OR have quit w/in 15years.) does not qualify.   Lung Cancer Screening Referral: n/a   Additional Screening:  Hepatitis C Screening: does not qualify; Completed   Vision Screening: Recommended annual ophthalmology exams for early detection of glaucoma and other disorders of the eye. Is the patient up to date with their annual eye exam?  Yes  Who is the provider or what is the name of the office in which the patient attends annual eye exams? Dr. Vanita Ingles in [place], [place] If pt is not established with a provider, would they like to be referred to a provider to establish care?  N/a .   Dental Screening: Recommended annual dental exams for proper oral hygiene  Community Resource Referral / Chronic Care Management: CRR required this visit?  No   CCM required this visit?  No      Plan:     I have personally reviewed and noted the following in the patient's chart:   Medical and social history Use of alcohol, tobacco or illicit drugs  Current medications and supplements including opioid prescriptions.  Functional ability and status Nutritional status Physical activity Advanced directives List of other  physicians Hospitalizations, surgeries, and ER visits in previous 12 months Vitals Screenings to include cognitive, depression, and falls Referrals and appointments  In addition, I have reviewed and discussed with patient certain preventive protocols, quality metrics, and best practice recommendations. A written personalized care plan for preventive services as well as general preventive health recommendations were provided to patient.     Laretta Bolster, [place]   9/37/9024   Nurse Notes: AWV completed by nurse in office. Patient gave verbal consent for this visit. Patient and provider both present in the office today. Visit took 35 minutes to complete.

## 2021-04-18 NOTE — Patient Instructions (Addendum)
Angelica Grimes , Thank you for taking time to come for your Medicare Wellness Visit. I appreciate your ongoing commitment to your health goals. Please review the following plan we discussed and let me know if I can assist you in the future.   Screening recommendations/referrals: Colonoscopy: No longer required Mammogram: 11/22 Bone Density: Complete Recommended yearly ophthalmology/optometry visit for glaucoma screening and checkup Recommended yearly dental visit for hygiene and checkup  Vaccinations: Influenza vaccine: Fall 2022 Pneumococcal vaccine: Complete Tdap vaccine: Due Shingles vaccine: Complete    Advanced directives: POA and Living Will  Conditions/risks identified: None  Next appointment: AWV in 1 year. No future office appointment scheduled at this time.    Preventive Care 25 Years and Older, Female Preventive care refers to lifestyle choices and visits with your health care provider that can promote health and wellness. What does preventive care include? A yearly physical exam. This is also called an annual well check. Dental exams once or twice a year. Routine eye exams. Ask your health care provider how often you should have your eyes checked. Personal lifestyle choices, including: Daily care of your teeth and gums. Regular physical activity. Eating a healthy diet. Avoiding tobacco and drug use. Limiting alcohol use. Practicing safe sex. Taking low-dose aspirin every day. Taking vitamin and mineral supplements as recommended by your health care provider. What happens during an annual well check? The services and screenings done by your health care provider during your annual well check will depend on your age, overall health, lifestyle risk factors, and family history of disease. Counseling  Your health care provider may ask you questions about your: Alcohol use. Tobacco use. Drug use. Emotional well-being. Home and relationship well-being. Sexual  activity. Eating habits. History of falls. Memory and ability to understand (cognition). Work and work Statistician. Reproductive health. Screening  You may have the following tests or measurements: Height, weight, and BMI. Blood pressure. Lipid and cholesterol levels. These may be checked every 5 years, or more frequently if you are over 50 years old. Skin check. Lung cancer screening. You may have this screening every year starting at age 69 if you have a 30-pack-year history of smoking and currently smoke or have quit within the past 15 years. Fecal occult blood test (FOBT) of the stool. You may have this test every year starting at age 25. Flexible sigmoidoscopy or colonoscopy. You may have a sigmoidoscopy every 5 years or a colonoscopy every 10 years starting at age 93. Hepatitis C blood test. Hepatitis B blood test. Sexually transmitted disease (STD) testing. Diabetes screening. This is done by checking your blood sugar (glucose) after you have not eaten for a while (fasting). You may have this done every 1-3 years. Bone density scan. This is done to screen for osteoporosis. You may have this done starting at age 78. Mammogram. This may be done every 1-2 years. Talk to your health care provider about how often you should have regular mammograms. Talk with your health care provider about your test results, treatment options, and if necessary, the need for more tests. Vaccines  Your health care provider may recommend certain vaccines, such as: Influenza vaccine. This is recommended every year. Tetanus, diphtheria, and acellular pertussis (Tdap, Td) vaccine. You may need a Td booster every 10 years. Zoster vaccine. You may need this after age 70. Pneumococcal 13-valent conjugate (PCV13) vaccine. One dose is recommended after age 63. Pneumococcal polysaccharide (PPSV23) vaccine. One dose is recommended after age 85. Talk to your health care  provider about which screenings and vaccines  you need and how often you need them. This information is not intended to replace advice given to you by your health care provider. Make sure you discuss any questions you have with your health care provider. Document Released: 10/15/2015 Document Revised: 06/07/2016 Document Reviewed: 07/20/2015 Elsevier Interactive Patient Education  2017 [place] Prevention in the Home Falls can cause injuries. They can happen to people of all ages. There are many things you can do to make your home safe and to help prevent falls. What can I do on the outside of my home? Regularly fix the edges of walkways and driveways and fix any cracks. Remove anything that might make you trip as you walk through a door, such as a raised step or threshold. Trim any bushes or trees on the path to your home. Use bright outdoor lighting. Clear any walking paths of anything that might make someone trip, such as rocks or tools. Regularly check to see if handrails are loose or broken. Make sure that both sides of any steps have handrails. Any raised decks and porches should have guardrails on the edges. Have any leaves, snow, or ice cleared regularly. Use sand or salt on walking paths during winter. Clean up any spills in your garage right away. This includes oil or grease spills. What can I do in the bathroom? Use night lights. Install grab bars by the toilet and in the tub and shower. Do not use towel bars as grab bars. Use non-skid mats or decals in the tub or shower. If you need to sit down in the shower, use a plastic, non-slip stool. Keep the floor dry. Clean up any water that spills on the floor as soon as it happens. Remove soap buildup in the tub or shower regularly. Attach bath mats securely with double-sided non-slip rug tape. Do not have throw rugs and other things on the floor that can make you trip. What can I do in the bedroom? Use night lights. Make sure that you have a light by your bed that  is easy to reach. Do not use any sheets or blankets that are too big for your bed. They should not hang down onto the floor. Have a firm chair that has side arms. You can use this for support while you get dressed. Do not have throw rugs and other things on the floor that can make you trip. What can I do in the kitchen? Clean up any spills right away. Avoid walking on wet floors. Keep items that you use a lot in easy-to-reach places. If you need to reach something above you, use a strong step stool that has a grab bar. Keep electrical cords out of the way. Do not use floor polish or wax that makes floors slippery. If you must use wax, use non-skid floor wax. Do not have throw rugs and other things on the floor that can make you trip. What can I do with my stairs? Do not leave any items on the stairs. Make sure that there are handrails on both sides of the stairs and use them. Fix handrails that are broken or loose. Make sure that handrails are as long as the stairways. Check any carpeting to make sure that it is firmly attached to the stairs. Fix any carpet that is loose or worn. Avoid having throw rugs at the top or bottom of the stairs. If you do have throw rugs, attach them to the  floor with carpet tape. Make sure that you have a light switch at the top of the stairs and the bottom of the stairs. If you do not have them, ask someone to add them for you. What else can I do to help prevent falls? Wear shoes that: Do not have high heels. Have rubber bottoms. Are comfortable and fit you well. Are closed at the toe. Do not wear sandals. If you use a stepladder: Make sure that it is fully opened. Do not climb a closed stepladder. Make sure that both sides of the stepladder are locked into place. Ask someone to hold it for you, if possible. Clearly mark and make sure that you can see: Any grab bars or handrails. First and last steps. Where the edge of each step is. Use tools that help you  move around (mobility aids) if they are needed. These include: Canes. Walkers. Scooters. Crutches. Turn on the lights when you go into a dark area. Replace any light bulbs as soon as they burn out. Set up your furniture so you have a clear path. Avoid moving your furniture around. If any of your floors are uneven, fix them. If there are any pets around you, be aware of where they are. Review your medicines with your doctor. Some medicines can make you feel dizzy. This can increase your chance of falling. Ask your doctor what other things that you can do to help prevent falls. This information is not intended to replace advice given to you by your health care provider. Make sure you discuss any questions you have with your health care provider. Document Released: 07/15/2009 Document Revised: 02/24/2016 Document Reviewed: 10/23/2014 Elsevier Interactive Patient Education  2017 [employer].

## 2021-04-18 NOTE — Progress Notes (Signed)
i

## 2021-05-16 ENCOUNTER — Other Ambulatory Visit: Payer: Self-pay | Admitting: Nurse Practitioner

## 2021-05-16 DIAGNOSIS — E785 Hyperlipidemia, unspecified: Secondary | ICD-10-CM

## 2021-05-17 LAB — CMP14+EGFR
ALT: 14 IU/L (ref 0–32)
AST: 16 IU/L (ref 0–40)
Albumin/Globulin Ratio: 2.6 — ABNORMAL HIGH (ref 1.2–2.2)
Albumin: 4.5 g/dL (ref 3.6–4.6)
Alkaline Phosphatase: 65 IU/L (ref 44–121)
BUN/Creatinine Ratio: 22 (ref 12–28)
BUN: 16 mg/dL (ref 8–27)
Bilirubin Total: 0.7 mg/dL (ref 0.0–1.2)
CO2: 21 mmol/L (ref 20–29)
Calcium: 8.8 mg/dL (ref 8.7–10.3)
Chloride: 103 mmol/L (ref 96–106)
Creatinine, Ser: 0.74 mg/dL (ref 0.57–1.00)
Globulin, Total: 1.7 g/dL (ref 1.5–4.5)
Glucose: 96 mg/dL (ref 65–99)
Potassium: 3.7 mmol/L (ref 3.5–5.2)
Sodium: 141 mmol/L (ref 134–144)
Total Protein: 6.2 g/dL (ref 6.0–8.5)
eGFR: 80 mL/min/{1.73_m2} (ref >59)

## 2021-05-17 LAB — TSH+FREE T4
Free T4: 1.87 ng/dL — ABNORMAL HIGH (ref 0.82–1.77)
TSH: 2.78 u[IU]/mL (ref 0.450–4.500)

## 2021-05-17 NOTE — Progress Notes (Signed)
Her labs look good. Her Free T4 is slightly elevated, but no need to change medicines at this time. We will discuss this at her appt tomorrow.

## 2021-05-18 ENCOUNTER — Other Ambulatory Visit: Payer: Self-pay

## 2021-05-18 ENCOUNTER — Encounter: Payer: Self-pay | Admitting: Nurse Practitioner

## 2021-05-18 ENCOUNTER — Ambulatory Visit (INDEPENDENT_AMBULATORY_CARE_PROVIDER_SITE_OTHER): Payer: Medicare Other | Admitting: Nurse Practitioner

## 2021-05-18 VITALS — BP 190/78 | HR 60 | Ht 64.5 in | Wt 146.0 lb

## 2021-05-18 DIAGNOSIS — E890 Postprocedural hypothyroidism: Secondary | ICD-10-CM | POA: Diagnosis not present

## 2021-05-18 DIAGNOSIS — I10 Essential (primary) hypertension: Secondary | ICD-10-CM | POA: Diagnosis not present

## 2021-05-18 DIAGNOSIS — R229 Localized swelling, mass and lump, unspecified: Secondary | ICD-10-CM | POA: Diagnosis not present

## 2021-05-18 NOTE — Assessment & Plan Note (Addendum)
BP Readings from Last 3 Encounters:  05/18/21 (!) 190/78  04/18/21 (!) 150/73  04/18/21 (!) 150/73  -BP significantly elevated today -she has been taking amlodipine PRN for elevated BP and HCTZ PRN for leg swelling related to amlodipine use, but states she hasn't used them in weeks -we discussed taking amlodipine in the evening and HCTZ in the AM

## 2021-05-18 NOTE — Patient Instructions (Addendum)
Continue to take carvedilol and losartan twice per day (one in AM and one in PM). Please start taking the amlodipine in the evening and hydrochlorothiazide in the morning.'  If your BP is elevated and you have intense headache or neurological issues like numbness, tingling, facial droop, or slurred speech, go to the emergency department immediately.

## 2021-05-18 NOTE — Progress Notes (Signed)
Acute Office Visit  Subjective:    Patient ID: Angelica Grimes, female    DOB: 12-Jun-1935, 86 y.o.   MRN: [id number]  Chief Complaint  Patient presents with   Hypertension    Follow up    Hypertension  Patient is in today for follow-up for HTN and hypothyroidism.  At her last OV, we decreased her levothyroxine. She had labs drawn prior to this appt.  At her last OV, we increased her losartan to 50 mg BID (from daily). Her home BPs have been elevated at home. She states it was 157/82 at home when she woke up.  She states that she has been stressed d/t moving from [place] to [place] (off [employer]).  BP elevated today, but she is asymptomatic.  Past Medical History:  Diagnosis Date   Anxiety    Brain tumor (benign) ([facility])    Cancer ([place])    breast   Cancer ([place])    thyroid   Fracture ankle fracture 01/24/16   GERD (gastroesophageal reflux disease)    Hyperlipidemia    Hypertension    Hypothyroidism    IBS (irritable bowel syndrome)    Low iron stores 11/08/2016   Positive colorectal cancer screening using Cologuard test 05/01/2018    Past Surgical History:  Procedure Laterality Date   breast cyst surgery      x 2 - benign    BREAST LUMPECTOMY     right breast with re-excision   BREAST SURGERY  1966, 1991, 1998   biopsy   BUNIONECTOMY  2012   right foot   COLONOSCOPY     [place]   due to cancer   TONSILLECTOMY      Family History  Problem Relation Age of Onset   Stroke Mother    Pneumonia Mother    Cancer Father        bladder, prostate   Pneumonia Father    Cancer Sister        kidney   Cancer Brother        lung   Diabetes Sister    Sarcoidosis Sister    Cancer Half-Sister        kidney cancer   Cancer Half-Sister        lung cancer   Atrial fibrillation Brother    Atrial fibrillation Brother    Atrial fibrillation Brother    Colon cancer Neg Hx     Social History   Socioeconomic History   Marital  status: Widowed    Spouse name: Not on file   Number of children: 2   Years of education: 35   Highest education level: Not on file  Occupational History   Not on file  Tobacco Use   Smoking status: Never   Smokeless tobacco: Never  Vaping Use   Vaping Use: Never used  Substance and Sexual Activity   Alcohol use: Yes    Comment: rare   Drug use: No   Sexual activity: Not Currently  Other Topics Concern   Not on file  Social History Narrative   Lives with grandson      Widow   2 children   1:  Daughter-Letitia (has permission to get info) 2 children: 1 son and daughter (son lives with her)   2: [place] Son-[name] 1 son      No pets      Diet: eats what she wants   Caffeine: none,  Maybe a coke at  times   Water: 3-4 bottles of 16 oz      Wear seat belt   Wear sun protection if needed   Smoke detectors and Security system    Does not use phone in car         Social Determinants of Health   Financial Resource Strain: Low Risk    Difficulty of Paying Living Expenses: Not hard at all  Food Insecurity: No Food Insecurity   Worried About Charity fundraiser in the Last Year: Never true   Arboriculturist in the Last Year: Never true  Transportation Needs: No Transportation Needs   Lack of Transportation (Medical): No   Lack of Transportation (Non-Medical): No  Physical Activity: Insufficiently Active   Days of Exercise per Week: 7 days   Minutes of Exercise per Session: 20 min  Stress: No Stress Concern Present   Feeling of Stress : Only a little  Social Connections: Moderately Isolated   Frequency of Communication with Friends and Family: More than three times a week   Frequency of Social Gatherings with Friends and Family: More than three times a week   Attends Religious Services: More than 4 times per year   Active Member of [employer] or Organizations: No   Attends Archivist Meetings: Never   Marital Status: Widowed  Human resources officer Violence: Not At Risk    Fear of Current or Ex-Partner: No   Emotionally Abused: No   Physically Abused: No   Sexually Abused: No    Outpatient Medications Prior to Visit  Medication Sig Dispense Refill   ALPRAZolam (XANAX) 0.25 MG tablet Take 0.25 mg by mouth as needed. anxiety     amLODipine (NORVASC) 2.5 MG tablet Take 1 tablet (2.5 mg total) by mouth daily. 90 tablet 3   carvedilol (COREG) 25 MG tablet Take 1 tablet (25 mg total) by mouth 2 (two) times daily. as directed 180 tablet 0   co-enzyme Q-10 50 MG capsule Take 100 mg by mouth daily.     Cyanocobalamin (VITAMIN B 12 PO) Take 1,000 mcg by mouth.     Glucosamine-Chondroitin (OSTEO BI-FLEX REGULAR STRENGTH PO) Take 2 tablets by mouth daily.     hydrochlorothiazide (HYDRODIURIL) 25 MG tablet Take 25 mg by mouth. As needed for swelling     levothyroxine (SYNTHROID) 75 MCG tablet Take 1 tablet (75 mcg total) by mouth daily. 30 tablet 1   losartan (COZAAR) 50 MG tablet Take 1 tablet (50 mg total) by mouth 2 (two) times daily. 60 tablet 1   omeprazole (PRILOSEC) 20 MG capsule TAKE ONE CAPSULE BY MOUTH DAILY 90 capsule 0   rosuvastatin (CRESTOR) 5 MG tablet TAKE 1 TABLET BY MOUTH DAILY 90 tablet 1   zinc gluconate 50 MG tablet Take 50 mg by mouth daily.     No facility-administered medications prior to visit.    Allergies  Allergen Reactions   Shrimp [Shellfish Allergy] Hives    Shrimp only   Aspirin Nausea Only and Other (See Comments)    Tightness of chest   Benazepril Swelling    Entire face, lips, throat   Other    Penicillins Hives, Itching and Rash    Abdominal area only   Clonidine Derivatives Other (See Comments)    Dryness of mouth, lowers blood pressure and pulse    Orange Fruit [Citrus] Rash    Fever blisters and blisters on knuckles     Review of Systems  Constitutional: Negative.  Respiratory: Negative.    Cardiovascular: Negative.        Elevated home BP readings  Skin:        Swelling to soft tissue above right clavicle       Objective:    Physical Exam Constitutional:      Appearance: Normal appearance.  Neck:     Comments: Swelling to soft tissue about right clavicle; no palpable lymph nodes Cardiovascular:     Rate and Rhythm: Normal rate and regular rhythm.     Pulses: Normal pulses.     Heart sounds: Normal heart sounds.  Pulmonary:     Effort: Pulmonary effort is normal.     Breath sounds: Normal breath sounds.  Musculoskeletal:     Cervical back: Normal range of motion and neck supple.  Neurological:     Mental Status: She is alert.    BP (!) 190/78 (BP Location: Left Arm, Patient Position: Sitting, Cuff Size: Normal)   Pulse 60   Ht 5' 4.5" (1.638 m)   Wt 146 lb (66.2 kg)   SpO2 98%   BMI 24.67 kg/m  Wt Readings from Last 3 Encounters:  05/18/21 146 lb (66.2 kg)  04/18/21 144 lb (65.3 kg)  04/18/21 144 lb (65.3 kg)    Health Maintenance Due  Topic Date Due   PNA vac Low Risk Adult (1 of 2 - PCV13) Never done   Zoster Vaccines- Shingrix (2 of 2) 03/26/2018   INFLUENZA VACCINE  05/02/2021    There are no preventive care reminders to display for this patient.   Lab Results  Component Value Date   TSH 2.780 05/16/2021   Lab Results  Component Value Date   WBC 5.5 04/11/2021   HGB 11.7 04/11/2021   HCT 34.4 04/11/2021   MCV 84 04/11/2021   PLT 162 04/11/2021   Lab Results  Component Value Date   NA 141 05/16/2021   K 3.7 05/16/2021   CO2 21 05/16/2021   GLUCOSE 96 05/16/2021   BUN 16 05/16/2021   CREATININE 0.74 05/16/2021   BILITOT 0.7 05/16/2021   ALKPHOS 65 05/16/2021   AST 16 05/16/2021   ALT 14 05/16/2021   PROT 6.2 05/16/2021   ALBUMIN 4.5 05/16/2021   CALCIUM 8.8 05/16/2021   ANIONGAP 7 05/08/2019   EGFR 80 05/16/2021   Lab Results  Component Value Date   CHOL 128 04/11/2021   Lab Results  Component Value Date   HDL 45 04/11/2021   Lab Results  Component Value Date   LDLCALC 65 04/11/2021   Lab Results  Component Value Date   TRIG 95  04/11/2021   Lab Results  Component Value Date   CHOLHDL 3.8 04/13/2020   Lab Results  Component Value Date   HGBA1C 6.0 (H) 04/11/2021       Assessment & Plan:   Problem List Items Addressed This Visit       Cardiovascular and Mediastinum   Hypertension - Primary    BP Readings from Last 3 Encounters:  05/18/21 (!) 190/78  04/18/21 (!) 150/73  04/18/21 (!) 150/73  -BP significantly elevated today -she has been taking amlodipine PRN for elevated BP and HCTZ PRN for leg swelling related to amlodipine use, but states she hasn't used them in weeks -we discussed taking amlodipine in the evening and HCTZ in the AM          Endocrine   Postsurgical hypothyroidism    Lab Results  Component Value Date   TSH  2.780 05/16/2021  -TSH looks great -T4 was slightly elevated, but no changes to meds today        Other   Localized skin mass, lump, or swelling    -above right clavicle -U/S ordered; no palpable mass -She had ammogram last year and has one upcoming      Relevant Orders   US Soft Tissue Head/Neck (NON-THYROID)     No orders of the defined types were placed in this encounter.    Noreene Larsson, NP

## 2021-05-18 NOTE — Assessment & Plan Note (Signed)
Lab Results  Component Value Date   TSH 2.780 05/16/2021   -TSH looks great -T4 was slightly elevated, but no changes to meds today

## 2021-05-18 NOTE — Assessment & Plan Note (Signed)
-  above right clavicle -U/S ordered; no palpable mass -She had ammogram last year and has one upcoming

## 2021-06-09 ENCOUNTER — Other Ambulatory Visit (HOSPITAL_COMMUNITY): Payer: Self-pay | Admitting: Nurse Practitioner

## 2021-06-09 DIAGNOSIS — Z1231 Encounter for screening mammogram for malignant neoplasm of breast: Secondary | ICD-10-CM

## 2021-06-11 ENCOUNTER — Other Ambulatory Visit: Payer: Self-pay | Admitting: Nurse Practitioner

## 2021-06-15 ENCOUNTER — Encounter: Payer: Self-pay | Admitting: Nurse Practitioner

## 2021-06-15 ENCOUNTER — Other Ambulatory Visit: Payer: Self-pay

## 2021-06-15 ENCOUNTER — Ambulatory Visit (INDEPENDENT_AMBULATORY_CARE_PROVIDER_SITE_OTHER): Payer: Medicare Other | Admitting: Nurse Practitioner

## 2021-06-15 VITALS — BP 155/69 | HR 57 | Temp 97.7°F | Ht 64.5 in | Wt 143.0 lb

## 2021-06-15 DIAGNOSIS — E785 Hyperlipidemia, unspecified: Secondary | ICD-10-CM

## 2021-06-15 DIAGNOSIS — I10 Essential (primary) hypertension: Secondary | ICD-10-CM | POA: Diagnosis not present

## 2021-06-15 DIAGNOSIS — R229 Localized swelling, mass and lump, unspecified: Secondary | ICD-10-CM | POA: Diagnosis not present

## 2021-06-15 DIAGNOSIS — E890 Postprocedural hypothyroidism: Secondary | ICD-10-CM | POA: Diagnosis not present

## 2021-06-15 NOTE — Assessment & Plan Note (Signed)
BP Readings from Last 3 Encounters:  06/15/21 (!) 155/69  05/18/21 (!) 190/78  04/18/21 (!) 150/73   -taking amlodipine, carvedilol, losartan -she STOPPED taking HCTZ d/t concerned with urinary urgency/incontinence -she takes amlodipine PRN for 5 pm BP > 150 -we discussed taking amlodipine each evening -otherwise, no change to meds -discussed hold parameters for BP meds of SBP < 100 and DBP < 60

## 2021-06-15 NOTE — Progress Notes (Signed)
Acute Office Visit  Subjective:    Patient ID: Angelica Grimes, female    DOB: 1935-07-18, 86 y.o.   MRN: [id number]  Chief Complaint  Patient presents with   Hypertension    Follow up    Hypertension  Patient is in today for BP check. At her alst OV, her BP was 190/78, and she had stopped taking amlodipine and HCTZ. She was instructed to restart AM HCTZ and PM amlodipine. She states her home BP was 126/69 last night. She states she is only taking amlodipine if her evening SBP is > 150.  She didn't start taking HCTZ because she is concerned with incontinence/urinary frequency.  Past Medical History:  Diagnosis Date   Anxiety    Brain tumor (benign) ([place])    Cancer ([place])    breast   Cancer ([place])    thyroid   Fracture ankle fracture 01/24/16   GERD (gastroesophageal reflux disease)    Hyperlipidemia    Hypertension    Hypothyroidism    IBS (irritable bowel syndrome)    Low iron stores 11/08/2016   Positive colorectal cancer screening using Cologuard test 05/01/2018    Past Surgical History:  Procedure Laterality Date   breast cyst surgery      x 2 - benign    BREAST LUMPECTOMY     right breast with re-excision   BREAST SURGERY  1966, 1991, 1998   biopsy   BUNIONECTOMY  2012   right foot   COLONOSCOPY     [place]   due to cancer   TONSILLECTOMY      Family History  Problem Relation Age of Onset   Stroke Mother    Pneumonia Mother    Cancer Father        bladder, prostate   Pneumonia Father    Cancer Sister        kidney   Cancer Brother        lung   Diabetes Sister    Sarcoidosis Sister    Cancer Half-Sister        kidney cancer   Cancer Half-Sister        lung cancer   Atrial fibrillation Brother    Atrial fibrillation Brother    Atrial fibrillation Brother    Colon cancer Neg Hx     Social History   Socioeconomic History   Marital status: Widowed    Spouse name: Not on file   Number of children: 2   Years of  education: 46   Highest education level: Not on file  Occupational History   Not on file  Tobacco Use   Smoking status: Never   Smokeless tobacco: Never  Vaping Use   Vaping Use: Never used  Substance and Sexual Activity   Alcohol use: Yes    Comment: rare   Drug use: No   Sexual activity: Not Currently  Other Topics Concern   Not on file  Social History Narrative   Lives with grandson      Widow   2 children   1: [place] Daughter-Letitia (has permission to get info) 2 children: 1 son and daughter (son lives with her)   2: [place] Son-[name] 1 son      No pets      Diet: eats what she wants   Caffeine: none,  Maybe a coke at times   Water: 3-4 bottles of 16 oz      Wear seat belt  Wear sun protection if needed   Smoke detectors and Security system    Does not use phone in car         Social Determinants of Health   Financial Resource Strain: Low Risk    Difficulty of Paying Living Expenses: Not hard at all  Food Insecurity: No Food Insecurity   Worried About Charity fundraiser in the Last Year: Never true   Arboriculturist in the Last Year: Never true  Transportation Needs: No Transportation Needs   Lack of Transportation (Medical): No   Lack of Transportation (Non-Medical): No  Physical Activity: Insufficiently Active   Days of Exercise per Week: 7 days   Minutes of Exercise per Session: 20 min  Stress: No Stress Concern Present   Feeling of Stress : Only a little  Social Connections: Moderately Isolated   Frequency of Communication with Friends and Family: More than three times a week   Frequency of Social Gatherings with Friends and Family: More than three times a week   Attends Religious Services: More than 4 times per year   Active Member of [employer] or Organizations: No   Attends Archivist Meetings: Never   Marital Status: Widowed  Human resources officer Violence: Not At Risk   Fear of Current or Ex-Partner: No   Emotionally Abused: No   Physically  Abused: No   Sexually Abused: No    Outpatient Medications Prior to Visit  Medication Sig Dispense Refill   ALPRAZolam (XANAX) 0.25 MG tablet Take 0.25 mg by mouth as needed. anxiety     carvedilol (COREG) 25 MG tablet Take 1 tablet (25 mg total) by mouth 2 (two) times daily. as directed 180 tablet 0   co-enzyme Q-10 50 MG capsule Take 100 mg by mouth daily.     Cyanocobalamin (VITAMIN B 12 PO) Take 1,000 mcg by mouth.     Glucosamine-Chondroitin (OSTEO BI-FLEX REGULAR STRENGTH PO) Take 2 tablets by mouth daily.     hydrochlorothiazide (HYDRODIURIL) 25 MG tablet Take 25 mg by mouth. As needed for swelling     losartan (COZAAR) 50 MG tablet Take 1 tablet (50 mg total) by mouth 2 (two) times daily. 60 tablet 1   omeprazole (PRILOSEC) 20 MG capsule TAKE ONE CAPSULE BY MOUTH DAILY 90 capsule 0   rosuvastatin (CRESTOR) 5 MG tablet TAKE 1 TABLET BY MOUTH DAILY 90 tablet 1   SYNTHROID 75 MCG tablet TAKE 1 TABLET BY MOUTH EVERY DAY 30 tablet 1   zinc gluconate 50 MG tablet Take 50 mg by mouth daily.     amLODipine (NORVASC) 2.5 MG tablet Take 1 tablet (2.5 mg total) by mouth daily. 90 tablet 3   No facility-administered medications prior to visit.    Allergies  Allergen Reactions   Shrimp [Shellfish Allergy] Hives    Shrimp only   Aspirin Nausea Only and Other (See Comments)    Tightness of chest   Benazepril Swelling    Entire face, lips, throat   Other    Penicillins Hives, Itching and Rash    Abdominal area only   Clonidine Derivatives Other (See Comments)    Dryness of mouth, lowers blood pressure and pulse    Orange Fruit [Citrus] Rash    Fever blisters and blisters on knuckles     Review of Systems  Constitutional: Negative.   Respiratory: Negative.    Cardiovascular: Negative.   Psychiatric/Behavioral: Negative.        Objective:  Physical Exam Constitutional:      Appearance: Normal appearance.  Cardiovascular:     Rate and Rhythm: Normal rate and regular rhythm.      Pulses: Normal pulses.     Heart sounds: Normal heart sounds.  Pulmonary:     Effort: Pulmonary effort is normal.     Breath sounds: Normal breath sounds.  Neurological:     Mental Status: She is alert.  Psychiatric:        Mood and Affect: Mood normal.        Behavior: Behavior normal.        Thought Content: Thought content normal.        Judgment: Judgment normal.    BP (!) 155/69 (BP Location: Left Arm, Patient Position: Sitting, Cuff Size: Large)   Pulse (!) 57   Temp 97.7 F (36.5 C) (Oral)   Ht 5' 4.5" (1.638 m)   Wt 143 lb (64.9 kg)   SpO2 96%   BMI 24.17 kg/m  Wt Readings from Last 3 Encounters:  06/15/21 143 lb (64.9 kg)  05/18/21 146 lb (66.2 kg)  04/18/21 144 lb (65.3 kg)    Health Maintenance Due  Topic Date Due   PNA vac Low Risk Adult (1 of 2 - PCV13) Never done   Zoster Vaccines- Shingrix (2 of 2) 03/26/2018    There are no preventive care reminders to display for this patient.   Lab Results  Component Value Date   TSH 2.780 05/16/2021   Lab Results  Component Value Date   WBC 5.5 04/11/2021   HGB 11.7 04/11/2021   HCT 34.4 04/11/2021   MCV 84 04/11/2021   PLT 162 04/11/2021   Lab Results  Component Value Date   NA 141 05/16/2021   K 3.7 05/16/2021   CO2 21 05/16/2021   GLUCOSE 96 05/16/2021   BUN 16 05/16/2021   CREATININE 0.74 05/16/2021   BILITOT 0.7 05/16/2021   ALKPHOS 65 05/16/2021   AST 16 05/16/2021   ALT 14 05/16/2021   PROT 6.2 05/16/2021   ALBUMIN 4.5 05/16/2021   CALCIUM 8.8 05/16/2021   ANIONGAP 7 05/08/2019   EGFR 80 05/16/2021   Lab Results  Component Value Date   CHOL 128 04/11/2021   Lab Results  Component Value Date   HDL 45 04/11/2021   Lab Results  Component Value Date   LDLCALC 65 04/11/2021   Lab Results  Component Value Date   TRIG 95 04/11/2021   Lab Results  Component Value Date   CHOLHDL 3.8 04/13/2020   Lab Results  Component Value Date   HGBA1C 6.0 (H) 04/11/2021        Assessment & Plan:   Problem List Items Addressed This Visit       Cardiovascular and Mediastinum   Hypertension - Primary    BP Readings from Last 3 Encounters:  06/15/21 (!) 155/69  05/18/21 (!) 190/78  04/18/21 (!) 150/73  -taking amlodipine, carvedilol, losartan -she STOPPED taking HCTZ d/t concerned with urinary urgency/incontinence -she takes amlodipine PRN for 5 pm BP > 150 -we discussed taking amlodipine each evening -otherwise, no change to meds -discussed hold parameters for BP meds of SBP < 100 and DBP < 60      Relevant Orders   CMP14+EGFR   CBC with Differential/Platelet   Lipid Panel With LDL/HDL Ratio     Endocrine   Postsurgical hypothyroidism   Relevant Orders   TSH + free T4     Other  Hyperlipidemia   Relevant Orders   CMP14+EGFR   Lipid Panel With LDL/HDL Ratio   Localized skin mass, lump, or swelling    -she states she wasn't called for U/S of her right neck, but this improved and she no longer want to get the u/s        No orders of the defined types were placed in this encounter.    Noreene Larsson, NP

## 2021-06-15 NOTE — Assessment & Plan Note (Signed)
-  she states she wasn't called for U/S of her right neck, but this improved and she no longer want to get the u/s

## 2021-06-15 NOTE — Patient Instructions (Signed)
Please have fasting labs drawn 2-3 days prior to your appointment so we can discuss the results during your office visit.  

## 2021-06-20 ENCOUNTER — Telehealth: Payer: Self-pay

## 2021-06-20 ENCOUNTER — Other Ambulatory Visit: Payer: Self-pay

## 2021-06-20 ENCOUNTER — Other Ambulatory Visit: Payer: Self-pay | Admitting: Nurse Practitioner

## 2021-06-20 MED ORDER — LEVOTHYROXINE SODIUM 75 MCG PO TABS: 75.0000 ug | ORAL_TABLET | Freq: Every day | ORAL | 1 refills | Status: DC

## 2021-06-20 NOTE — Telephone Encounter (Signed)
Sent!

## 2021-06-20 NOTE — Telephone Encounter (Signed)
Patient called asked can this be switched from 30 day to a 90 day supply  SYNTHROID 75 MCG tablet  Pharmacy: [facility]

## 2021-06-27 ENCOUNTER — Other Ambulatory Visit: Payer: Self-pay | Admitting: Nurse Practitioner

## 2021-07-15 ENCOUNTER — Ambulatory Visit (HOSPITAL_COMMUNITY): Payer: Medicare Other

## 2021-07-18 ENCOUNTER — Ambulatory Visit (HOSPITAL_COMMUNITY): Payer: Medicare Other

## 2021-07-19 ENCOUNTER — Other Ambulatory Visit: Payer: Self-pay | Admitting: Internal Medicine

## 2021-07-19 ENCOUNTER — Encounter: Payer: Self-pay | Admitting: Nurse Practitioner

## 2021-07-19 DIAGNOSIS — I10 Essential (primary) hypertension: Secondary | ICD-10-CM

## 2021-07-19 MED ORDER — AMLODIPINE BESYLATE 5 MG PO TABS: 5.0000 mg | ORAL_TABLET | Freq: Every day | ORAL | 0 refills | Status: DC

## 2021-07-22 ENCOUNTER — Other Ambulatory Visit: Payer: Self-pay

## 2021-07-22 ENCOUNTER — Ambulatory Visit (HOSPITAL_COMMUNITY)
Admission: RE | Admit: 2021-07-22 | Discharge: 2021-07-22 | Disposition: A | Discharge status: Home / Self Care | Payer: Medicare Other | Source: Ambulatory Visit | Attending: Nurse Practitioner | Admitting: Nurse Practitioner

## 2021-07-22 DIAGNOSIS — Z1231 Encounter for screening mammogram for malignant neoplasm of breast: Secondary | ICD-10-CM

## 2021-07-24 ENCOUNTER — Other Ambulatory Visit: Payer: Self-pay | Admitting: Nurse Practitioner

## 2021-08-19 ENCOUNTER — Encounter: Payer: Self-pay | Admitting: Nurse Practitioner

## 2021-08-31 ENCOUNTER — Encounter: Payer: Self-pay | Admitting: Internal Medicine

## 2021-08-31 ENCOUNTER — Other Ambulatory Visit: Payer: Self-pay

## 2021-08-31 ENCOUNTER — Ambulatory Visit (INDEPENDENT_AMBULATORY_CARE_PROVIDER_SITE_OTHER): Payer: Medicare Other | Admitting: Internal Medicine

## 2021-08-31 VITALS — BP 152/82 | HR 62 | Resp 18 | Ht 65.0 in | Wt 142.0 lb

## 2021-08-31 DIAGNOSIS — E890 Postprocedural hypothyroidism: Secondary | ICD-10-CM

## 2021-08-31 DIAGNOSIS — R55 Syncope and collapse: Secondary | ICD-10-CM | POA: Diagnosis not present

## 2021-08-31 DIAGNOSIS — I10 Essential (primary) hypertension: Secondary | ICD-10-CM | POA: Diagnosis not present

## 2021-08-31 MED ORDER — FELODIPINE ER 5 MG PO TB24: 5.0000 mg | ORAL_TABLET | Freq: Every day | ORAL | 2 refills | Status: DC

## 2021-08-31 NOTE — Assessment & Plan Note (Signed)
Lab Results  Component Value Date   TSH 2.780 05/16/2021   Takes brand Synthroid as she did not tolerate generic Levothyroxine in the past

## 2021-08-31 NOTE — Patient Instructions (Signed)
Please start taking Felodipine instead of Amlodipine.  Please continue to take other medications as prescribed.  Please bring the BP cuff in the next visit.

## 2021-08-31 NOTE — Assessment & Plan Note (Signed)
BP Readings from Last 1 Encounters:  08/31/21 (!) 152/82   uncontrolled with Losartan 50 mg BID and Coreg 25 mg BID Takes Amlodine 5 mg PRN for SBP > 150, but had syncope when she took it last time, prefers to change it as she is not comfortable with the manufacturer of Amlodipine Switched to Felodipine 5 mg QD Advised to bring the BP cuff in the next visit Takes HCTZ for leg swelling Counseled for compliance with the medications Advised DASH diet and moderate exercise/walking, at least 150 mins/week

## 2021-08-31 NOTE — Progress Notes (Signed)
Established Patient Office Visit  Subjective:  Patient ID: Angelica Grimes, female    DOB: [birth year]  Age: 86 y.o. MRN: [id number]  CC:  Chief Complaint  Patient presents with   Hypertension    Pt has been checking bp at home and it runs in the 190s has been running high for about a month     HPI Angelica Grimes is a 86 y.o. female with past medical history of HTN, hypothyroidism, DCIS of right breast and urge incontinence who presents for f/u of HTN.  HTN: She reports that her blood pressure runs high in the 190s to 200s over 90s at home at times.  She has been taking losartan 50 mg twice daily and Coreg 25 mg twice daily.  She had been taking amlodipine 5 mg as needed for SBP > 150.  She recently had an episode of syncope at home when she got a new supply of amlodipine.  She states that this was a new manufacturer of amlodipine and thinks that she had syncopal episode due to it.  She prefers to change her medicine for now.  Syncope: She states that she went to kitchen to drink water and had felt lightheaded and ringing in her ears before the fall.  She denies LOC, seizures or nausea after the fall.  She denies any major injury from the fall.  Of note, chart review suggest that she has HCTZ on the list, but she had not taken it on the day of syncope.  She takes it as needed for leg swelling, but does not take it every day due to incontinence problem.  She denies any dysuria or hematuria currently.  Past Medical History:  Diagnosis Date   Anxiety    Brain tumor (benign) ([place])    Cancer ([place])    breast   Cancer ([place])    thyroid   Fracture ankle fracture 01/24/16   GERD (gastroesophageal reflux disease)    Hyperlipidemia    Hypertension    Hypothyroidism    IBS (irritable bowel syndrome)    Low iron stores 11/08/2016   Positive colorectal cancer screening using Cologuard test 05/01/2018    Past Surgical History:  Procedure Laterality Date   breast cyst surgery      x 2 - benign     BREAST LUMPECTOMY     right breast with re-excision   BREAST SURGERY  1966, 1991, 1998   biopsy   BUNIONECTOMY  2012   right foot   COLONOSCOPY     [place]   due to cancer   TONSILLECTOMY      Family History  Problem Relation Age of Onset   Stroke Mother    Pneumonia Mother    Cancer Father        bladder, prostate   Pneumonia Father    Cancer Sister        kidney   Cancer Brother        lung   Diabetes Sister    Sarcoidosis Sister    Cancer Half-Sister        kidney cancer   Cancer Half-Sister        lung cancer   Atrial fibrillation Brother    Atrial fibrillation Brother    Atrial fibrillation Brother    Colon cancer Neg Hx     Social History   Socioeconomic History   Marital status: Widowed    Spouse name: Not on file  Number of children: 2   Years of education: 12   Highest education level: Not on file  Occupational History   Not on file  Tobacco Use   Smoking status: Never   Smokeless tobacco: Never  Vaping Use   Vaping Use: Never used  Substance and Sexual Activity   Alcohol use: Yes    Comment: rare   Drug use: No   Sexual activity: Not Currently  Other Topics Concern   Not on file  Social History Narrative   Lives with grandson      Widow   2 children   1: [place] Daughter-Letitia (has permission to get info) 2 children: 1 son and daughter (son lives with her)   2: [place] Son-[name] 1 son      No pets      Diet: eats what she wants   Caffeine: none,  Maybe a coke at times   Water: 3-4 bottles of 16 oz      Wear seat belt   Wear sun protection if needed   Oceanographer and Security system    Does not use phone in car         Social Determinants of Health   Financial Resource Strain: Low Risk    Difficulty of Paying Living Expenses: Not hard at all  Food Insecurity: No Food Insecurity   Worried About Charity fundraiser in the Last Year: Never true   Arboriculturist in the Last Year: Never true   Transportation Needs: No Transportation Needs   Lack of Transportation (Medical): No   Lack of Transportation (Non-Medical): No  Physical Activity: Insufficiently Active   Days of Exercise per Week: 7 days   Minutes of Exercise per Session: 20 min  Stress: No Stress Concern Present   Feeling of Stress : Only a little  Social Connections: Moderately Isolated   Frequency of Communication with Friends and Family: More than three times a week   Frequency of Social Gatherings with Friends and Family: More than three times a week   Attends Religious Services: More than 4 times per year   Active Member of [employer] or Organizations: No   Attends Archivist Meetings: Never   Marital Status: Widowed  Human resources officer Violence: Not At Risk   Fear of Current or Ex-Partner: No   Emotionally Abused: No   Physically Abused: No   Sexually Abused: No    Outpatient Medications Prior to Visit  Medication Sig Dispense Refill   ALPRAZolam (XANAX) 0.25 MG tablet Take 0.25 mg by mouth as needed. anxiety     carvedilol (COREG) 25 MG tablet TAKE 1 TABLET BY MOUTH TWICE DAILY AS DIRECTED 180 tablet 0   co-enzyme Q-10 50 MG capsule Take 100 mg by mouth daily.     Cyanocobalamin (VITAMIN B 12 PO) Take 1,000 mcg by mouth.     Glucosamine-Chondroitin (OSTEO BI-FLEX REGULAR STRENGTH PO) Take 2 tablets by mouth daily.     hydrochlorothiazide (HYDRODIURIL) 25 MG tablet Take 25 mg by mouth. As needed for swelling     levothyroxine (SYNTHROID) 75 MCG tablet Take 1 tablet (75 mcg total) by mouth daily. 90 tablet 1   losartan (COZAAR) 50 MG tablet TAKE 1 TABLET BY MOUTH TWICE DAILY 60 tablet 2   omeprazole (PRILOSEC) 20 MG capsule TAKE ONE CAPSULE BY MOUTH DAILY 90 capsule 0   rosuvastatin (CRESTOR) 5 MG tablet TAKE 1 TABLET BY MOUTH DAILY 90 tablet 1   zinc gluconate  50 MG tablet Take 50 mg by mouth daily.     amLODipine (NORVASC) 5 MG tablet Take 1 tablet (5 mg total) by mouth daily. (Patient not taking:  Reported on 08/31/2021) 90 tablet 0   No facility-administered medications prior to visit.    Allergies  Allergen Reactions   Shrimp [Shellfish Allergy] Hives    Shrimp only   Aspirin Nausea Only and Other (See Comments)    Tightness of chest   Benazepril Swelling    Entire face, lips, throat   Other    Penicillins Hives, Itching and Rash    Abdominal area only   Clonidine Derivatives Other (See Comments)    Dryness of mouth, lowers blood pressure and pulse    Orange Fruit [Citrus] Rash    Fever blisters and blisters on knuckles     ROS Review of Systems  Constitutional:  Negative for chills and fever.  HENT:  Negative for congestion, sinus pressure, sinus pain and sore throat.   Eyes:  Negative for pain and discharge.  Respiratory:  Negative for cough and shortness of breath.   Cardiovascular:  Negative for chest pain and palpitations.  Gastrointestinal:  Negative for abdominal pain, constipation, diarrhea, nausea and vomiting.  Endocrine: Negative for polydipsia and polyuria.  Genitourinary:  Negative for dysuria and hematuria.  Musculoskeletal:  Negative for neck pain and neck stiffness.  Skin:  Negative for rash.  Neurological:  Positive for syncope and light-headedness. Negative for weakness.  Psychiatric/Behavioral:  Negative for agitation and behavioral problems.      Objective:    Physical Exam Vitals reviewed.  Constitutional:      General: She is not in acute distress.    Appearance: She is not diaphoretic.  HENT:     Head: Normocephalic and atraumatic.     Nose: Nose normal. No congestion.     Mouth/Throat:     Mouth: Mucous membranes are moist.     Pharynx: No posterior oropharyngeal erythema.  Eyes:     General: No scleral icterus.    Extraocular Movements: Extraocular movements intact.  Cardiovascular:     Rate and Rhythm: Normal rate and regular rhythm.     Pulses: Normal pulses.     Heart sounds: Normal heart sounds. No murmur  heard. Pulmonary:     Breath sounds: Normal breath sounds. No wheezing or rales.  Musculoskeletal:     Cervical back: Neck supple. No tenderness.     Right lower leg: No edema.     Left lower leg: No edema.  Skin:    General: Skin is warm.     Findings: No rash.  Neurological:     General: No focal deficit present.     Mental Status: She is alert and oriented to person, place, and time.     Sensory: No sensory deficit.     Motor: No weakness.  Psychiatric:        Mood and Affect: Mood normal.        Behavior: Behavior normal.    BP (!) 152/82 (BP Location: Left Arm, Patient Position: Sitting, Cuff Size: Normal)   Pulse 62   Resp 18   Ht 5' 5"  (1.651 m)   Wt 142 lb 0.6 oz (64.4 kg)   SpO2 98%   BMI 23.64 kg/m  Wt Readings from Last 3 Encounters:  08/31/21 142 lb 0.6 oz (64.4 kg)  06/15/21 143 lb (64.9 kg)  05/18/21 146 lb (66.2 kg)    Lab Results  Component Value  Date   TSH 2.780 05/16/2021   Lab Results  Component Value Date   WBC 5.5 04/11/2021   HGB 11.7 04/11/2021   HCT 34.4 04/11/2021   MCV 84 04/11/2021   PLT 162 04/11/2021   Lab Results  Component Value Date   NA 141 05/16/2021   K 3.7 05/16/2021   CO2 21 05/16/2021   GLUCOSE 96 05/16/2021   BUN 16 05/16/2021   CREATININE 0.74 05/16/2021   BILITOT 0.7 05/16/2021   ALKPHOS 65 05/16/2021   AST 16 05/16/2021   ALT 14 05/16/2021   PROT 6.2 05/16/2021   ALBUMIN 4.5 05/16/2021   CALCIUM 8.8 05/16/2021   ANIONGAP 7 05/08/2019   EGFR 80 05/16/2021   Lab Results  Component Value Date   CHOL 128 04/11/2021   Lab Results  Component Value Date   HDL 45 04/11/2021   Lab Results  Component Value Date   LDLCALC 65 04/11/2021   Lab Results  Component Value Date   TRIG 95 04/11/2021   Lab Results  Component Value Date   CHOLHDL 3.8 04/13/2020   Lab Results  Component Value Date   HGBA1C 6.0 (H) 04/11/2021      Assessment & Plan:   Problem List Items Addressed This Visit        Cardiovascular and Mediastinum   Hypertension - Primary    BP Readings from Last 1 Encounters:  08/31/21 (!) 152/82  uncontrolled with Losartan 50 mg BID and Coreg 25 mg BID Takes Amlodine 5 mg PRN for SBP > 150, but had syncope when she took it last time, prefers to change it as she is not comfortable with the manufacturer of Amlodipine Switched to Felodipine 5 mg QD Advised to bring the BP cuff in the next visit Takes HCTZ for leg swelling Counseled for compliance with the medications Advised DASH diet and moderate exercise/walking, at least 150 mins/week       Relevant Medications   felodipine (PLENDIL) 5 MG 24 hr tablet   Other Relevant Orders   EKG 12-Lead (Completed)     Endocrine   Postsurgical hypothyroidism    Lab Results  Component Value Date   TSH 2.780 05/16/2021  Takes brand Synthroid as she did not tolerate generic Levothyroxine in the past        Other   Syncope    EKG: Sinus rhythm. No signs of active ischemia.  Orthostatics negative  Episode could be due to orthostatic hypotension on that day, advised to maintain adequate hydration for now      Relevant Orders   EKG 12-Lead (Completed)    Meds ordered this encounter  Medications   felodipine (PLENDIL) 5 MG 24 hr tablet    Sig: Take 1 tablet (5 mg total) by mouth daily.    Dispense:  30 tablet    Refill:  2    Follow-up: Return in about 3 months (around 11/29/2021) for HTN and hypothyroidism.    Lindell Spar, MD

## 2021-08-31 NOTE — Assessment & Plan Note (Addendum)
EKG: Sinus rhythm. No signs of active ischemia.  Orthostatics negative  Episode could be due to orthostatic hypotension on that day, advised to maintain adequate hydration for now

## 2021-09-01 ENCOUNTER — Telehealth: Payer: Self-pay | Admitting: Nurse Practitioner

## 2021-09-01 NOTE — Telephone Encounter (Signed)
Called and spoke with pharmacist at [facility]. They are aware the amlodipine has been discontinued and patient is to start on Felodipine.

## 2021-09-01 NOTE — Telephone Encounter (Signed)
Juliann Pulse with [facility] called in on pt behalf   Pharm states that pt has new prescription for FELODIPINE  And is currently taking Amlodipine  Pharm want to know if they need to D/C the Amlodipine    Call back [facility]  [phone]

## 2021-09-07 NOTE — Progress Notes (Signed)
Mammogram negative, repeat in 1 year.

## 2021-09-12 ENCOUNTER — Other Ambulatory Visit: Payer: Self-pay | Admitting: Nurse Practitioner

## 2021-11-18 ENCOUNTER — Other Ambulatory Visit: Payer: Self-pay | Admitting: Internal Medicine

## 2021-11-18 DIAGNOSIS — E785 Hyperlipidemia, unspecified: Secondary | ICD-10-CM

## 2021-11-23 ENCOUNTER — Encounter: Payer: Self-pay | Admitting: Internal Medicine

## 2021-11-23 ENCOUNTER — Ambulatory Visit (INDEPENDENT_AMBULATORY_CARE_PROVIDER_SITE_OTHER): Payer: Medicare Other | Admitting: Internal Medicine

## 2021-11-23 ENCOUNTER — Ambulatory Visit: Payer: Medicare Other | Admitting: Nurse Practitioner

## 2021-11-23 ENCOUNTER — Other Ambulatory Visit: Payer: Self-pay

## 2021-11-23 VITALS — BP 132/74 | HR 63 | Resp 18 | Ht 64.5 in | Wt 135.8 lb

## 2021-11-23 DIAGNOSIS — K219 Gastro-esophageal reflux disease without esophagitis: Secondary | ICD-10-CM | POA: Diagnosis not present

## 2021-11-23 DIAGNOSIS — I10 Essential (primary) hypertension: Secondary | ICD-10-CM

## 2021-11-23 DIAGNOSIS — Z2821 Immunization not carried out because of patient refusal: Secondary | ICD-10-CM | POA: Diagnosis not present

## 2021-11-23 DIAGNOSIS — E890 Postprocedural hypothyroidism: Secondary | ICD-10-CM | POA: Diagnosis not present

## 2021-11-23 NOTE — Assessment & Plan Note (Signed)
Lab Results  Component Value Date   TSH 2.780 05/16/2021   Takes brand Synthroid as she did not tolerate generic Levothyroxine in the past

## 2021-11-23 NOTE — Assessment & Plan Note (Addendum)
BP Readings from Last 1 Encounters:  11/23/21 132/74  Well controlled with felodipine 5 mg daily Advised to take losartan 50 mg daily regularly instead of as needed Takes HCTZ PRN for leg swelling Counseled for compliance with the medications Advised DASH diet and moderate exercise/walking as tolerated

## 2021-11-23 NOTE — Progress Notes (Signed)
Established Patient Office Visit  Subjective:  Patient ID: Angelica Grimes, female    DOB: December 24, 1934  Age: 87 y.o. MRN: [id number]  CC:  Chief Complaint  Patient presents with   Follow-up    4 month follow up felodipine is working great pt takes losartan and carvedilol here and there doesn't feel like she needs this as felodopine is working good     HPI Angelica Grimes is a 87 y.o. female with past medical history of HTN, hypothyroidism, DCIS of right breast and urge incontinence who presents for f/u of her chronic medical conditions.   HTN: Her BP is well controlled now.  She has been taking felodipine, but has not been taking losartan and carvedilol on a regular basis.  She takes losartan for SBP > 130, and has not taken her carvedilol for last few weeks.  She currently denies any chest pain, dyspnea or palpitations currently.  She is on levothyroxine for history of hypothyroidism.  She denies any recent change in appetite or weight.   Past Medical History:  Diagnosis Date   Anxiety    Brain tumor (benign) ([place])    Cancer ([place])    breast   Cancer ([place])    thyroid   Fracture ankle fracture 01/24/16   GERD (gastroesophageal reflux disease)    Hyperlipidemia    Hypertension    Hypothyroidism    IBS (irritable bowel syndrome)    Low iron stores 11/08/2016   Positive colorectal cancer screening using Cologuard test 05/01/2018    Past Surgical History:  Procedure Laterality Date   breast cyst surgery      x 2 - benign    BREAST LUMPECTOMY     right breast with re-excision   BREAST SURGERY  1966, 1991, 1998   biopsy   BUNIONECTOMY  2012   right foot   COLONOSCOPY     [place]   due to cancer   TONSILLECTOMY      Family History  Problem Relation Age of Onset   Stroke Mother    Pneumonia Mother    Cancer Father        bladder, prostate   Pneumonia Father    Cancer Sister        kidney   Cancer Brother        lung   Diabetes  Sister    Sarcoidosis Sister    Cancer Half-Sister        kidney cancer   Cancer Half-Sister        lung cancer   Atrial fibrillation Brother    Atrial fibrillation Brother    Atrial fibrillation Brother    Colon cancer Neg Hx     Social History   Socioeconomic History   Marital status: Widowed    Spouse name: Not on file   Number of children: 2   Years of education: 88   Highest education level: Not on file  Occupational History   Not on file  Tobacco Use   Smoking status: Never   Smokeless tobacco: Never  Vaping Use   Vaping Use: Never used  Substance and Sexual Activity   Alcohol use: Yes    Comment: rare   Drug use: No   Sexual activity: Not Currently  Other Topics Concern   Not on file  Social History Narrative   Lives with grandson      Widow   2 children   1:  Daughter-Letitia (has  permission to get info) 2 children: 1 son and daughter (son lives with her)   2: [place] Son-[name] 1 son      No pets      Diet: eats what she wants   Caffeine: none,  Maybe a coke at times   Water: 3-4 bottles of 16 oz      Wear seat belt   Wear sun protection if needed   Oceanographer and Security system    Does not use phone in car         Social Determinants of Health   Financial Resource Strain: Low Risk    Difficulty of Paying Living Expenses: Not hard at all  Food Insecurity: No Food Insecurity   Worried About Charity fundraiser in the Last Year: Never true   Arboriculturist in the Last Year: Never true  Transportation Needs: No Transportation Needs   Lack of Transportation (Medical): No   Lack of Transportation (Non-Medical): No  Physical Activity: Insufficiently Active   Days of Exercise per Week: 7 days   Minutes of Exercise per Session: 20 min  Stress: No Stress Concern Present   Feeling of Stress : Only a little  Social Connections: Moderately Isolated   Frequency of Communication with Friends and Family: More than three times a week   Frequency of  Social Gatherings with Friends and Family: More than three times a week   Attends Religious Services: More than 4 times per year   Active Member of [employer] or Organizations: No   Attends Archivist Meetings: Never   Marital Status: Widowed  Human resources officer Violence: Not At Risk   Fear of Current or Ex-Partner: No   Emotionally Abused: No   Physically Abused: No   Sexually Abused: No    Outpatient Medications Prior to Visit  Medication Sig Dispense Refill   ALPRAZolam (XANAX) 0.25 MG tablet Take 0.25 mg by mouth as needed. anxiety     co-enzyme Q-10 50 MG capsule Take 100 mg by mouth daily.     Cyanocobalamin (VITAMIN B 12 PO) Take 1,000 mcg by mouth.     felodipine (PLENDIL) 5 MG 24 hr tablet Take 1 tablet (5 mg total) by mouth daily. 30 tablet 2   Glucosamine-Chondroitin (OSTEO BI-FLEX REGULAR STRENGTH PO) Take 2 tablets by mouth daily.     hydrochlorothiazide (HYDRODIURIL) 25 MG tablet Take 25 mg by mouth. As needed for swelling     levothyroxine (SYNTHROID) 75 MCG tablet Take 1 tablet (75 mcg total) by mouth daily. 90 tablet 1   losartan (COZAAR) 50 MG tablet TAKE 1 TABLET BY MOUTH TWICE DAILY 60 tablet 2   omeprazole (PRILOSEC) 20 MG capsule TAKE ONE CAPSULE BY MOUTH DAILY 90 capsule 2   rosuvastatin (CRESTOR) 5 MG tablet TAKE 1 TABLET BY MOUTH DAILY 90 tablet 3   zinc gluconate 50 MG tablet Take 50 mg by mouth daily.     carvedilol (COREG) 25 MG tablet TAKE 1 TABLET BY MOUTH TWICE DAILY AS DIRECTED 180 tablet 2   No facility-administered medications prior to visit.    Allergies  Allergen Reactions   Shrimp [Shellfish Allergy] Hives    Shrimp only   Aspirin Nausea Only and Other (See Comments)    Tightness of chest   Benazepril Swelling    Entire face, lips, throat   Other    Penicillins Hives, Itching and Rash    Abdominal area only   Clonidine Derivatives Other (See Comments)  Dryness of mouth, lowers blood pressure and pulse    Orange Fruit [Citrus] Rash     Fever blisters and blisters on knuckles     ROS Review of Systems  Constitutional:  Negative for chills and fever.  HENT:  Negative for congestion, sinus pressure, sinus pain and sore throat.   Eyes:  Negative for pain and discharge.  Respiratory:  Negative for cough and shortness of breath.   Cardiovascular:  Negative for chest pain and palpitations.  Gastrointestinal:  Negative for abdominal pain, constipation, diarrhea, nausea and vomiting.  Endocrine: Negative for polydipsia and polyuria.  Genitourinary:  Negative for dysuria and hematuria.  Musculoskeletal:  Negative for neck pain and neck stiffness.  Skin:  Negative for rash.  Neurological:  Negative for dizziness and weakness.  Psychiatric/Behavioral:  Negative for agitation and behavioral problems.      Objective:    Physical Exam Vitals reviewed.  Constitutional:      General: She is not in acute distress.    Appearance: She is not diaphoretic.  HENT:     Head: Normocephalic and atraumatic.     Nose: Nose normal. No congestion.     Mouth/Throat:     Mouth: Mucous membranes are moist.     Pharynx: No posterior oropharyngeal erythema.  Eyes:     General: No scleral icterus.    Extraocular Movements: Extraocular movements intact.  Cardiovascular:     Rate and Rhythm: Normal rate and regular rhythm.     Pulses: Normal pulses.     Heart sounds: Normal heart sounds. No murmur heard. Pulmonary:     Breath sounds: Normal breath sounds. No wheezing or rales.  Musculoskeletal:     Cervical back: Neck supple. No tenderness.     Right lower leg: No edema.     Left lower leg: No edema.  Skin:    General: Skin is warm.     Findings: No rash.  Neurological:     General: No focal deficit present.     Mental Status: She is alert and oriented to person, place, and time.     Sensory: No sensory deficit.     Motor: No weakness.  Psychiatric:        Mood and Affect: Mood normal.        Behavior: Behavior normal.     BP 132/74 (BP Location: Left Arm, Patient Position: Sitting, Cuff Size: Normal)    Pulse 63    Resp 18    Ht 5' 4.5" (1.638 m)    Wt 135 lb 12.8 oz (61.6 kg)    SpO2 97%    BMI 22.95 kg/m  Wt Readings from Last 3 Encounters:  11/23/21 135 lb 12.8 oz (61.6 kg)  08/31/21 142 lb 0.6 oz (64.4 kg)  06/15/21 143 lb (64.9 kg)    Lab Results  Component Value Date   TSH 2.780 05/16/2021   Lab Results  Component Value Date   WBC 5.5 04/11/2021   HGB 11.7 04/11/2021   HCT 34.4 04/11/2021   MCV 84 04/11/2021   PLT 162 04/11/2021   Lab Results  Component Value Date   NA 141 05/16/2021   K 3.7 05/16/2021   CO2 21 05/16/2021   GLUCOSE 96 05/16/2021   BUN 16 05/16/2021   CREATININE 0.74 05/16/2021   BILITOT 0.7 05/16/2021   ALKPHOS 65 05/16/2021   AST 16 05/16/2021   ALT 14 05/16/2021   PROT 6.2 05/16/2021   ALBUMIN 4.5 05/16/2021   CALCIUM 8.8  05/16/2021   ANIONGAP 7 05/08/2019   EGFR 80 05/16/2021   Lab Results  Component Value Date   CHOL 128 04/11/2021   Lab Results  Component Value Date   HDL 45 04/11/2021   Lab Results  Component Value Date   LDLCALC 65 04/11/2021   Lab Results  Component Value Date   TRIG 95 04/11/2021   Lab Results  Component Value Date   CHOLHDL 3.8 04/13/2020   Lab Results  Component Value Date   HGBA1C 6.0 (H) 04/11/2021      Assessment & Plan:   Problem List Items Addressed This Visit       Cardiovascular and Mediastinum   Hypertension - Primary    BP Readings from Last 1 Encounters:  11/23/21 132/74  Well controlled with felodipine 5 mg daily Advised to take losartan 50 mg daily regularly instead of as needed Takes HCTZ PRN for leg swelling Counseled for compliance with the medications Advised DASH diet and moderate exercise/walking as tolerated      Relevant Orders   TSH + free T4   CMP14+EGFR     Digestive   GERD (gastroesophageal reflux disease)    Well controlled with omeprazole        Endocrine    Postsurgical hypothyroidism    Lab Results  Component Value Date   TSH 2.780 05/16/2021  Takes brand Synthroid as she did not tolerate generic Levothyroxine in the past      Relevant Orders   TSH + free T4   CMP14+EGFR   Other Visit Diagnoses     Refused influenza vaccine           No orders of the defined types were placed in this encounter.   Follow-up: Return in about 4 months (around 03/23/2022) for HTN.    Lindell Spar, MD

## 2021-11-23 NOTE — Assessment & Plan Note (Signed)
Well controlled with omeprazole. 

## 2021-11-23 NOTE — Patient Instructions (Signed)
Please stop taking Carvedilol. Please take Felodipine at nighttime and Losartan in the morning.  Please continue to take other medications as prescribed.  Please continue to follow low salt diet and ambulate as tolerated.

## 2021-11-24 ENCOUNTER — Encounter: Payer: Self-pay | Admitting: Internal Medicine

## 2021-11-24 ENCOUNTER — Other Ambulatory Visit: Payer: Self-pay | Admitting: Internal Medicine

## 2021-11-24 DIAGNOSIS — I10 Essential (primary) hypertension: Secondary | ICD-10-CM

## 2021-11-24 LAB — CMP14+EGFR
ALT: 23 IU/L (ref 0–32)
AST: 21 IU/L (ref 0–40)
Albumin/Globulin Ratio: 2.2 (ref 1.2–2.2)
Albumin: 4.6 g/dL (ref 3.6–4.6)
Alkaline Phosphatase: 79 IU/L (ref 44–121)
BUN/Creatinine Ratio: 19 (ref 12–28)
BUN: 17 mg/dL (ref 8–27)
Bilirubin Total: 0.6 mg/dL (ref 0.0–1.2)
CO2: 22 mmol/L (ref 20–29)
Calcium: 9.7 mg/dL (ref 8.7–10.3)
Chloride: 101 mmol/L (ref 96–106)
Creatinine, Ser: 0.88 mg/dL (ref 0.57–1.00)
Globulin, Total: 2.1 g/dL (ref 1.5–4.5)
Glucose: 93 mg/dL (ref 70–99)
Potassium: 4 mmol/L (ref 3.5–5.2)
Sodium: 140 mmol/L (ref 134–144)
Total Protein: 6.7 g/dL (ref 6.0–8.5)
eGFR: 65 mL/min/{1.73_m2} (ref >59)

## 2021-11-24 LAB — TSH+FREE T4
Free T4: 2.01 ng/dL — ABNORMAL HIGH (ref 0.82–1.77)
TSH: 3.94 u[IU]/mL (ref 0.450–4.500)

## 2021-11-25 ENCOUNTER — Other Ambulatory Visit: Payer: Self-pay | Admitting: Internal Medicine

## 2021-11-25 DIAGNOSIS — E782 Mixed hyperlipidemia: Secondary | ICD-10-CM

## 2021-11-26 LAB — LIPID PANEL

## 2021-12-01 ENCOUNTER — Other Ambulatory Visit: Payer: Self-pay | Admitting: Internal Medicine

## 2021-12-02 ENCOUNTER — Other Ambulatory Visit: Payer: Self-pay | Admitting: *Deleted

## 2021-12-02 ENCOUNTER — Telehealth: Payer: Self-pay | Admitting: Internal Medicine

## 2021-12-02 DIAGNOSIS — I10 Essential (primary) hypertension: Secondary | ICD-10-CM

## 2021-12-02 MED ORDER — FELODIPINE ER 5 MG PO TB24: 5.0000 mg | ORAL_TABLET | Freq: Every day | ORAL | 0 refills | Status: DC

## 2021-12-02 NOTE — Telephone Encounter (Signed)
Please call [facility] --pt is requesting a 90 day supply of the Felodipine 5 mg  ? ? ?Or you can Fax  ?

## 2021-12-02 NOTE — Telephone Encounter (Signed)
Medication sent to pharmacy  

## 2021-12-06 LAB — LIPID PANEL
Chol/HDL Ratio: 3.7 ratio (ref 0.0–4.4)
Cholesterol, Total: 192 mg/dL (ref 100–199)
HDL: 52 mg/dL (ref >39)
LDL Chol Calc (NIH): 114 mg/dL — ABNORMAL HIGH (ref 0–99)
Triglycerides: 149 mg/dL (ref 0–149)
VLDL Cholesterol Cal: 26 mg/dL (ref 5–40)

## 2021-12-06 LAB — SPECIMEN STATUS REPORT

## 2022-02-15 ENCOUNTER — Other Ambulatory Visit: Payer: Self-pay | Admitting: Family Medicine

## 2022-02-20 ENCOUNTER — Other Ambulatory Visit: Payer: Self-pay | Admitting: Internal Medicine

## 2022-02-20 DIAGNOSIS — I10 Essential (primary) hypertension: Secondary | ICD-10-CM

## 2022-03-21 ENCOUNTER — Encounter: Payer: Self-pay | Admitting: Internal Medicine

## 2022-03-24 ENCOUNTER — Encounter: Payer: Self-pay | Admitting: Internal Medicine

## 2022-03-24 ENCOUNTER — Ambulatory Visit (INDEPENDENT_AMBULATORY_CARE_PROVIDER_SITE_OTHER): Payer: Medicare Other | Admitting: Internal Medicine

## 2022-03-24 VITALS — BP 128/82 | HR 65 | Resp 18 | Ht 64.0 in | Wt 136.6 lb

## 2022-03-24 DIAGNOSIS — E782 Mixed hyperlipidemia: Secondary | ICD-10-CM

## 2022-03-24 DIAGNOSIS — I10 Essential (primary) hypertension: Secondary | ICD-10-CM

## 2022-03-24 DIAGNOSIS — F418 Other specified anxiety disorders: Secondary | ICD-10-CM | POA: Diagnosis not present

## 2022-03-24 DIAGNOSIS — E890 Postprocedural hypothyroidism: Secondary | ICD-10-CM

## 2022-03-24 MED ORDER — ALPRAZOLAM 0.5 MG PO TABS: 0.2500 mg | ORAL_TABLET | Freq: Two times a day (BID) | ORAL | 0 refills | Status: DC | PRN

## 2022-03-25 DIAGNOSIS — F418 Other specified anxiety disorders: Secondary | ICD-10-CM | POA: Insufficient documentation

## 2022-03-25 LAB — LIPID PANEL
Chol/HDL Ratio: 2.8 ratio (ref 0.0–4.4)
Cholesterol, Total: 155 mg/dL (ref 100–199)
HDL: 56 mg/dL (ref >39)
LDL Chol Calc (NIH): 84 mg/dL (ref 0–99)
Triglycerides: 76 mg/dL (ref 0–149)
VLDL Cholesterol Cal: 15 mg/dL (ref 5–40)

## 2022-03-25 LAB — TSH+FREE T4
Free T4: 1.89 ng/dL — ABNORMAL HIGH (ref 0.82–1.77)
TSH: 5.53 u[IU]/mL — ABNORMAL HIGH (ref 0.450–4.500)

## 2022-03-25 NOTE — Assessment & Plan Note (Signed)
On Crestor 

## 2022-03-25 NOTE — Assessment & Plan Note (Signed)
Takes Xanax 0.25 mg PRN, rarely needs it Refilled today

## 2022-03-27 ENCOUNTER — Encounter: Payer: Self-pay | Admitting: Internal Medicine

## 2022-03-29 ENCOUNTER — Encounter: Payer: Self-pay | Admitting: *Deleted

## 2022-03-30 ENCOUNTER — Other Ambulatory Visit: Payer: Self-pay | Admitting: Internal Medicine

## 2022-03-30 ENCOUNTER — Telehealth: Payer: Self-pay

## 2022-03-30 DIAGNOSIS — I10 Essential (primary) hypertension: Secondary | ICD-10-CM

## 2022-03-30 NOTE — Telephone Encounter (Signed)
Patient returning lab result call 

## 2022-03-30 NOTE — Telephone Encounter (Signed)
Patient advised with verbal understanding  

## 2022-03-30 NOTE — Telephone Encounter (Signed)
Patient will be leaving the house at 9:30 am

## 2022-04-20 ENCOUNTER — Ambulatory Visit (INDEPENDENT_AMBULATORY_CARE_PROVIDER_SITE_OTHER): Payer: Medicare Other | Admitting: *Deleted

## 2022-04-20 DIAGNOSIS — Z0000 Encounter for general adult medical examination without abnormal findings: Secondary | ICD-10-CM

## 2022-04-20 NOTE — Patient Instructions (Signed)
Angelica Grimes , Thank you for taking time to come for your Medicare Wellness Visit. I appreciate your ongoing commitment to your health goals. Please review the following plan we discussed and let me know if I can assist you in the future.   Screening recommendations/referrals: Mammogram: completed Bone Density: Completed Recommended yearly ophthalmology/optometry visit for glaucoma screening and checkup Recommended yearly dental visit for hygiene and checkup  Vaccinations: Influenza vaccine: completed Pneumococcal vaccine: completed Tdap vaccine: completed Shingles vaccine: completed    Advanced directives: copy requested   Conditions/risks identified: Hypertension, Falls  Next appointment: 1 year   Preventive Care 85 Years and Older, Female Preventive care refers to lifestyle choices and visits with your health care provider that can promote health and wellness. What does preventive care include? A yearly physical exam. This is also called an annual well check. Dental exams once or twice a year. Routine eye exams. Ask your health care provider how often you should have your eyes checked. Personal lifestyle choices, including: Daily care of your teeth and gums. Regular physical activity. Eating a healthy diet. Avoiding tobacco and drug use. Limiting alcohol use. Practicing safe sex. Taking low-dose aspirin every day. Taking vitamin and mineral supplements as recommended by your health care provider. What happens during an annual well check? The services and screenings done by your health care provider during your annual well check will depend on your age, overall health, lifestyle risk factors, and family history of disease. Counseling  Your health care provider may ask you questions about your: Alcohol use. Tobacco use. Drug use. Emotional well-being. Home and relationship well-being. Sexual activity. Eating habits. History of falls. Memory and ability to understand  (cognition). Work and work Statistician. Reproductive health. Screening  You may have the following tests or measurements: Height, weight, and BMI. Blood pressure. Lipid and cholesterol levels. These may be checked every 5 years, or more frequently if you are over 36 years old. Skin check. Lung cancer screening. You may have this screening every year starting at age 67 if you have a 30-pack-year history of smoking and currently smoke or have quit within the past 15 years. Fecal occult blood test (FOBT) of the stool. You may have this test every year starting at age 72. Flexible sigmoidoscopy or colonoscopy. You may have a sigmoidoscopy every 5 years or a colonoscopy every 10 years starting at age 7. Hepatitis C blood test. Hepatitis B blood test. Sexually transmitted disease (STD) testing. Diabetes screening. This is done by checking your blood sugar (glucose) after you have not eaten for a while (fasting). You may have this done every 1-3 years. Bone density scan. This is done to screen for osteoporosis. You may have this done starting at age 35. Mammogram. This may be done every 1-2 years. Talk to your health care provider about how often you should have regular mammograms. Talk with your health care provider about your test results, treatment options, and if necessary, the need for more tests. Vaccines  Your health care provider may recommend certain vaccines, such as: Influenza vaccine. This is recommended every year. Tetanus, diphtheria, and acellular pertussis (Tdap, Td) vaccine. You may need a Td booster every 10 years. Zoster vaccine. You may need this after age 46. Pneumococcal 13-valent conjugate (PCV13) vaccine. One dose is recommended after age 47. Pneumococcal polysaccharide (PPSV23) vaccine. One dose is recommended after age 41. Talk to your health care provider about which screenings and vaccines you need and how often you need them. This information  is not intended to  replace advice given to you by your health care provider. Make sure you discuss any questions you have with your health care provider. Document Released: 10/15/2015 Document Revised: 06/07/2016 Document Reviewed: 07/20/2015 Elsevier Interactive Patient Education  2017 [place] Prevention in the Home Falls can cause injuries. They can happen to people of all ages. There are many things you can do to make your home safe and to help prevent falls. What can I do on the outside of my home? Regularly fix the edges of walkways and driveways and fix any cracks. Remove anything that might make you trip as you walk through a door, such as a raised step or threshold. Trim any bushes or trees on the path to your home. Use bright outdoor lighting. Clear any walking paths of anything that might make someone trip, such as rocks or tools. Regularly check to see if handrails are loose or broken. Make sure that both sides of any steps have handrails. Any raised decks and porches should have guardrails on the edges. Have any leaves, snow, or ice cleared regularly. Use sand or salt on walking paths during winter. Clean up any spills in your garage right away. This includes oil or grease spills. What can I do in the bathroom? Use night lights. Install grab bars by the toilet and in the tub and shower. Do not use towel bars as grab bars. Use non-skid mats or decals in the tub or shower. If you need to sit down in the shower, use a plastic, non-slip stool. Keep the floor dry. Clean up any water that spills on the floor as soon as it happens. Remove soap buildup in the tub or shower regularly. Attach bath mats securely with double-sided non-slip rug tape. Do not have throw rugs and other things on the floor that can make you trip. What can I do in the bedroom? Use night lights. Make sure that you have a light by your bed that is easy to reach. Do not use any sheets or blankets that are too big for  your bed. They should not hang down onto the floor. Have a firm chair that has side arms. You can use this for support while you get dressed. Do not have throw rugs and other things on the floor that can make you trip. What can I do in the kitchen? Clean up any spills right away. Avoid walking on wet floors. Keep items that you use a lot in easy-to-reach places. If you need to reach something above you, use a strong step stool that has a grab bar. Keep electrical cords out of the way. Do not use floor polish or wax that makes floors slippery. If you must use wax, use non-skid floor wax. Do not have throw rugs and other things on the floor that can make you trip. What can I do with my stairs? Do not leave any items on the stairs. Make sure that there are handrails on both sides of the stairs and use them. Fix handrails that are broken or loose. Make sure that handrails are as long as the stairways. Check any carpeting to make sure that it is firmly attached to the stairs. Fix any carpet that is loose or worn. Avoid having throw rugs at the top or bottom of the stairs. If you do have throw rugs, attach them to the floor with carpet tape. Make sure that you have a light switch at the top of  the stairs and the bottom of the stairs. If you do not have them, ask someone to add them for you. What else can I do to help prevent falls? Wear shoes that: Do not have high heels. Have rubber bottoms. Are comfortable and fit you well. Are closed at the toe. Do not wear sandals. If you use a stepladder: Make sure that it is fully opened. Do not climb a closed stepladder. Make sure that both sides of the stepladder are locked into place. Ask someone to hold it for you, if possible. Clearly mark and make sure that you can see: Any grab bars or handrails. First and last steps. Where the edge of each step is. Use tools that help you move around (mobility aids) if they are needed. These  include: Canes. Walkers. Scooters. Crutches. Turn on the lights when you go into a dark area. Replace any light bulbs as soon as they burn out. Set up your furniture so you have a clear path. Avoid moving your furniture around. If any of your floors are uneven, fix them. If there are any pets around you, be aware of where they are. Review your medicines with your doctor. Some medicines can make you feel dizzy. This can increase your chance of falling. Ask your doctor what other things that you can do to help prevent falls. This information is not intended to replace advice given to you by your health care provider. Make sure you discuss any questions you have with your health care provider. Document Released: 07/15/2009 Document Revised: 02/24/2016 Document Reviewed: 10/23/2014 Elsevier Interactive Patient Education  2017 [employer].

## 2022-04-20 NOTE — Progress Notes (Signed)
Subjective:   Angelica Grimes is a 87 y.o. female who presents for Medicare Annual (Subsequent) preventive examination.  I connected with  [name] on 04/20/22 by a audio enabled telemedicine application and verified that I am speaking with the correct person using two identifiers.  Patient Location: Home  Provider Location: Office/Clinic  I discussed the limitations of evaluation and management by telemedicine. The patient expressed understanding and agreed to proceed.   Review of Systems     Ms. Angelica Grimes , Thank you for taking time to come for your Medicare Wellness Visit. I appreciate your ongoing commitment to your health goals. Please review the following plan we discussed and let me know if I can assist you in the future.   These are the goals we discussed:  Goals      Blood Pressure < 140/90     Prevent falls        This is a list of the screening recommended for you and due dates:  Health Maintenance  Topic Date Due   Pneumonia Vaccine (2 - PPSV23 or PCV20) 06/25/2015   COVID-19 Vaccine (3 - Moderna risk series) 03/02/2020   Flu Shot  05/02/2022   Tetanus Vaccine  10/24/2023   DEXA scan (bone density measurement)  Completed   Zoster (Shingles) Vaccine  Completed   HPV Vaccine  Aged Out          Objective:    There were no vitals filed for this visit. There is no height or weight on file to calculate BMI.     04/18/2021    9:46 AM 03/16/2021    2:16 PM 05/19/2019    2:40 PM 06/18/2018   10:33 AM 11/01/2017    2:00 PM 10/18/2017    2:05 PM 05/01/2017   10:57 AM  Advanced Directives  Does Patient Have a Medical Advance Directive? Yes Yes No;Yes Yes Yes Yes Yes  Type of Paramedic of [place];Living will [place];Living will [place];Living will [place];Living will [place];Living will [place];Living will [place];Living will  Does patient want to make changes to medical advance directive?  No - Patient declined  No - Patient declined  No - Patient declined   Copy of [place] in Chart? No - copy requested No - copy requested No - copy requested No - copy requested No - copy requested No - copy requested No - copy requested  Would patient like information on creating a medical advance directive?   No - Patient declined        Current Medications (verified) Outpatient Encounter Medications as of 04/20/2022  Medication Sig   ALPRAZolam (XANAX) 0.5 MG tablet Take 0.5-1 tablets (0.25-0.5 mg total) by mouth 2 (two) times daily as needed for anxiety.   co-enzyme Q-10 50 MG capsule Take 100 mg by mouth daily.   Cyanocobalamin (VITAMIN B 12 PO) Take 1,000 mcg by mouth.   felodipine (PLENDIL) 5 MG 24 hr tablet TAKE 1 TABLET BY MOUTH EVERY DAY   Glucosamine-Chondroitin (OSTEO BI-FLEX REGULAR STRENGTH PO) Take 2 tablets by mouth daily.   hydrochlorothiazide (HYDRODIURIL) 25 MG tablet Take 25 mg by mouth. As needed for swelling   losartan (COZAAR) 50 MG tablet TAKE 1 TABLET BY MOUTH TWICE DAILY   omeprazole (PRILOSEC) 20 MG capsule TAKE ONE CAPSULE BY MOUTH DAILY   rosuvastatin (CRESTOR) 5 MG tablet TAKE 1  TABLET BY MOUTH DAILY   SYNTHROID 75 MCG tablet TAKE 1 TABLET BY MOUTH DAILY   zinc gluconate 50 MG tablet Take 50 mg by mouth daily.   No facility-administered encounter medications on file as of 04/20/2022.    Allergies (verified) Shrimp [shellfish allergy], Aspirin, Benazepril, Other, Penicillins, Clonidine derivatives, and Orange fruit [citrus]   History: Past Medical History:  Diagnosis Date   Anxiety    Brain tumor (benign) ([place])    Cancer ([place])    breast   Cancer ([place])    thyroid   Fracture ankle fracture 01/24/16   GERD (gastroesophageal reflux disease)    Hyperlipidemia    Hypertension    Hypothyroidism    IBS (irritable bowel syndrome)    Low iron stores  11/08/2016   Positive colorectal cancer screening using Cologuard test 05/01/2018   Past Surgical History:  Procedure Laterality Date   breast cyst surgery      x 2 - benign    BREAST LUMPECTOMY     right breast with re-excision   BREAST SURGERY  1966, 1991, 1998   biopsy   BUNIONECTOMY  2012   right foot   COLONOSCOPY     [place]   due to cancer   TONSILLECTOMY     Family History  Problem Relation Age of Onset   Stroke Mother    Pneumonia Mother    Cancer Father        bladder, prostate   Pneumonia Father    Cancer Sister        kidney   Cancer Brother        lung   Diabetes Sister    Sarcoidosis Sister    Cancer Half-Sister        kidney cancer   Cancer Half-Sister        lung cancer   Atrial fibrillation Brother    Atrial fibrillation Brother    Atrial fibrillation Brother    Colon cancer Neg Hx    Social History   Socioeconomic History   Marital status: Widowed    Spouse name: Not on file   Number of children: 2   Years of education: 36   Highest education level: Not on file  Occupational History   Not on file  Tobacco Use   Smoking status: Never   Smokeless tobacco: Never  Vaping Use   Vaping Use: Never used  Substance and Sexual Activity   Alcohol use: Yes    Comment: rare   Drug use: No   Sexual activity: Not Currently  Other Topics Concern   Not on file  Social History Narrative   Lives with grandson      Widow   2 children   1: [place] Daughter-Angelica Grimes (has permission to get info) 2 children: 1 son and daughter (son lives with her)   2: [place] Son-[name] 1 son      No pets      Diet: eats what she wants   Caffeine: none,  Maybe a coke at times   Water: 3-4 bottles of 16 oz      Wear seat belt   Wear sun protection if needed   Smoke detectors and Security system    Does not use phone in car         Social Determinants of Health   Financial Resource Strain: Low Risk  (04/18/2021)   Overall Financial  Resource Strain (CARDIA)    Difficulty of Paying  Living Expenses: Not hard at all  Food Insecurity: No Food Insecurity (04/18/2021)   Hunger Vital Sign    Worried About Running Out of Food in the Last Year: Never true    Ran Out of Food in the Last Year: Never true  Transportation Needs: No Transportation Needs (04/18/2021)   PRAPARE - Hydrologist (Medical): No    Lack of Transportation (Non-Medical): No  Physical Activity: Insufficiently Active (04/18/2021)   Exercise Vital Sign    Days of Exercise per Week: 7 days    Minutes of Exercise per Session: 20 min  Stress: No Stress Concern Present (04/18/2021)   [place]    Feeling of Stress : Only a little  Social Connections: Moderately Isolated (04/18/2021)   Social Connection and Isolation Panel [NHANES]    Frequency of Communication with Friends and Family: More than three times a week    Frequency of Social Gatherings with Friends and Family: More than three times a week    Attends Religious Services: More than 4 times per year    Active Member of [employer] or Organizations: No    Attends Archivist Meetings: Never    Marital Status: Widowed    Tobacco Counseling Counseling given: Not Answered   Clinical Intake:                 Diabetic?no         Activities of Daily Living     No data to display           Patient Care Team: Lindell Spar, MD as PCP - General (Internal Medicine) Harl Bowie, Alphonse Guild, MD as PCP - Cardiology (Cardiology) Gatha Mayer, MD as Consulting Physician (Radiation Oncology) Rolm Bookbinder, MD as Consulting Physician (General Surgery) Gari Crown, MD (Unknown Physician Specialty) Santo Held, MD (Unknown Physician Specialty) Danie Binder, MD (Inactive) as Consulting Physician (Gastroenterology) Danie Binder, MD (Inactive) as Consulting Physician  (Gastroenterology)  Indicate any recent Medical Services you may have received from other than [facility] providers in the past year (date may be approximate).     Assessment:   This is a routine wellness examination for Syla.  Hearing/Vision screen No results found.  Dietary issues and exercise activities discussed:     Goals Addressed   None   Depression Screen    03/24/2022    9:31 AM 11/23/2021    9:08 AM 08/31/2021   10:55 AM 06/15/2021   10:16 AM 05/18/2021    9:16 AM 04/18/2021    9:45 AM 03/16/2021    2:26 PM  PHQ 2/9 Scores  PHQ - 2 Score 0 0 0 0 0 0 0    Fall Risk    03/24/2022    9:31 AM 11/23/2021    9:08 AM 08/31/2021   10:55 AM 06/15/2021   10:15 AM 05/18/2021    9:16 AM  Fall Risk   Falls in the past year? 0 0 1 0 1  Number falls in past yr: 0 0 1 0 0  Injury with Fall? 0 0 1 0 0  Risk for fall due to : No Fall Risks No Fall Risks History of fall(s);Impaired balance/gait;Impaired mobility No Fall Risks Impaired balance/gait  Follow up Falls evaluation completed Falls evaluation completed Falls evaluation completed;Falls prevention discussed Falls evaluation completed Falls evaluation completed    FALL RISK PREVENTION PERTAINING TO THE HOME:  Any stairs in or around the  home? Yes  If so, are there any without handrails? No  Home free of loose throw rugs in walkways, pet beds, electrical cords, etc? Yes  Adequate lighting in your home to reduce risk of falls? Yes   ASSISTIVE DEVICES UTILIZED TO PREVENT FALLS:  Life alert? No  Use of a cane, walker or w/c? No  Grab bars in the bathroom? Yes  Shower chair or bench in shower? Yes  Elevated toilet seat or a handicapped toilet? Yes    Cognitive Function:        04/18/2021    9:48 AM  6CIT Screen  What Year? 0 points  What month? 0 points  What time? 0 points  Count back from 20 0 points  Months in reverse 0 points  Repeat phrase 0 points  Total Score 0 points    Immunizations Immunization  History  Administered Date(s) Administered   Fluad Quad(high Dose 65+) 07/05/2020   Influenza Split 06/19/2012   Influenza,inj,Quad PF,6+ Mos 07/02/2018   Influenza,inj,quad, With Preservative 06/27/2017   Influenza-Unspecified 08/01/2015   Moderna Sars-Covid-2 Vaccination 01/07/2020, 02/03/2020   Pneumococcal Conjugate-13 06/24/2014   Pneumococcal-Unspecified 06/25/2011   Tdap 10/23/2013   Zoster Recombinat (Shingrix) 06/27/2017, 01/29/2018    TDAP status: Up to date  Flu Vaccine status: Up to date  Pneumococcal vaccine status: Up to date  Covid-19 vaccine status: Completed vaccines  Qualifies for Shingles Vaccine? Yes   Zostavax completed Yes   Shingrix Completed?: Yes  Screening Tests Health Maintenance  Topic Date Due   Pneumonia Vaccine 33+ Years old (2 - PPSV23 or PCV20) 06/25/2015   COVID-19 Vaccine (3 - Moderna risk series) 03/02/2020   INFLUENZA VACCINE  05/02/2022   TETANUS/TDAP  10/24/2023   DEXA SCAN  Completed   Zoster Vaccines- Shingrix  Completed   HPV VACCINES  Aged Out    Health Maintenance  Health Maintenance Due  Topic Date Due   Pneumonia Vaccine 64+ Years old (2 - PPSV23 or PCV20) 06/25/2015   COVID-19 Vaccine (3 - Moderna risk series) 03/02/2020    Colorectal cancer screening: No longer required.   Mammogram status: Completed 07-22-21. Repeat every year  Bone Density status: Completed 04-29-20. Results reflect: Bone density results: OSTEOPENIA. Repeat every 3 years.  Lung Cancer Screening: (Low Dose CT Chest recommended if Age 37-80 years, 30 pack-year currently smoking OR have quit w/in 15years.) does not qualify.   Lung Cancer Screening Referral: NA  Additional Screening:  Hepatitis C Screening: does qualify; Completed   Vision Screening: Recommended annual ophthalmology exams for early detection of glaucoma and other disorders of the eye. Is the patient up to date with their annual eye exam?  No  Who is the provider or what is the  name of the office in which the patient attends annual eye exams? [place] If pt is not established with a provider, would they like to be referred to a provider to establish care? No .   Dental Screening: Recommended annual dental exams for proper oral hygiene  Community Resource Referral / Chronic Care Management: CRR required this visit?  No   CCM required this visit?  No      Plan:     I have personally reviewed and noted the following in the patient's chart:   Medical and social history Use of alcohol, tobacco or illicit drugs  Current medications and supplements including opioid prescriptions.  Functional ability and status Nutritional status Physical activity Advanced directives List of other physicians Hospitalizations, surgeries, and ER  visits in previous 12 months Vitals Screenings to include cognitive, depression, and falls Referrals and appointments  In addition, I have reviewed and discussed with patient certain preventive protocols, quality metrics, and best practice recommendations. A written personalized care plan for preventive services as well as general preventive health recommendations were provided to patient.     Shelda Altes, CMA   04/20/2022   Nurse Notes:  Ms. Kendrick , Thank you for taking time to come for your Medicare Wellness Visit. I appreciate your ongoing commitment to your health goals. Please review the following plan we discussed and let me know if I can assist you in the future.   These are the goals we discussed:  Goals      Blood Pressure < 140/90     Prevent falls        This is a list of the screening recommended for you and due dates:  Health Maintenance  Topic Date Due   Pneumonia Vaccine (2 - PPSV23 or PCV20) 06/25/2015   COVID-19 Vaccine (3 - Moderna risk series) 03/02/2020   Flu Shot  05/02/2022   Tetanus Vaccine  10/24/2023   DEXA scan (bone density measurement)  Completed   Zoster (Shingles) Vaccine  Completed    HPV Vaccine  Aged Out

## 2022-05-03 ENCOUNTER — Encounter: Payer: Self-pay | Admitting: Internal Medicine

## 2022-05-03 ENCOUNTER — Other Ambulatory Visit: Payer: Self-pay | Admitting: Internal Medicine

## 2022-05-03 DIAGNOSIS — D329 Benign neoplasm of meninges, unspecified: Secondary | ICD-10-CM

## 2022-05-18 ENCOUNTER — Ambulatory Visit (HOSPITAL_COMMUNITY)
Admission: RE | Admit: 2022-05-18 | Discharge: 2022-05-18 | Disposition: A | Discharge status: Home / Self Care | Payer: Medicare Other | Source: Ambulatory Visit | Attending: Internal Medicine | Admitting: Internal Medicine

## 2022-05-18 DIAGNOSIS — D329 Benign neoplasm of meninges, unspecified: Secondary | ICD-10-CM | POA: Diagnosis not present

## 2022-05-18 MED ORDER — GADOBUTROL 1 MMOL/ML IV SOLN
8.0000 mL | Freq: Once | INTRAVENOUS | Status: AC | PRN
  Administered 2022-05-18: 8 mL via INTRAVENOUS

## 2022-06-18 ENCOUNTER — Other Ambulatory Visit: Payer: Self-pay | Admitting: Family Medicine

## 2022-06-26 ENCOUNTER — Other Ambulatory Visit (HOSPITAL_COMMUNITY): Payer: Self-pay | Admitting: Internal Medicine

## 2022-06-26 DIAGNOSIS — Z1231 Encounter for screening mammogram for malignant neoplasm of breast: Secondary | ICD-10-CM

## 2022-07-24 ENCOUNTER — Encounter: Payer: Self-pay | Admitting: Internal Medicine

## 2022-07-24 ENCOUNTER — Ambulatory Visit (INDEPENDENT_AMBULATORY_CARE_PROVIDER_SITE_OTHER): Payer: Medicare Other | Admitting: Internal Medicine

## 2022-07-24 ENCOUNTER — Ambulatory Visit: Payer: Medicare Other | Admitting: Internal Medicine

## 2022-07-24 VITALS — BP 132/62 | HR 75 | Resp 18 | Ht 64.5 in | Wt 141.2 lb

## 2022-07-24 DIAGNOSIS — I10 Essential (primary) hypertension: Secondary | ICD-10-CM

## 2022-07-24 DIAGNOSIS — R7303 Prediabetes: Secondary | ICD-10-CM | POA: Insufficient documentation

## 2022-07-24 DIAGNOSIS — E782 Mixed hyperlipidemia: Secondary | ICD-10-CM

## 2022-07-24 DIAGNOSIS — E890 Postprocedural hypothyroidism: Secondary | ICD-10-CM | POA: Diagnosis not present

## 2022-07-24 DIAGNOSIS — F418 Other specified anxiety disorders: Secondary | ICD-10-CM

## 2022-07-24 DIAGNOSIS — D329 Benign neoplasm of meninges, unspecified: Secondary | ICD-10-CM

## 2022-07-24 DIAGNOSIS — D432 Neoplasm of uncertain behavior of brain, unspecified: Secondary | ICD-10-CM

## 2022-07-24 NOTE — Assessment & Plan Note (Signed)
Lab Results  Component Value Date   TSH 5.530 (H) 03/24/2022   Takes brand Synthroid as she did not tolerate generic Levothyroxine in the past Had borderline elevated free T4 last time, check TSH and free T4 today

## 2022-07-24 NOTE — Assessment & Plan Note (Signed)
Lab Results  Component Value Date   HGBA1C 6.0 (H) 04/11/2021   Advised to follow low-carb diet for now

## 2022-07-24 NOTE — Assessment & Plan Note (Addendum)
Noted on MRI of brain, stable in size Does not have surrounding edema, likely benign mass She is currently asymptomatic

## 2022-07-24 NOTE — Assessment & Plan Note (Signed)
BP Readings from Last 1 Encounters:  07/24/22 132/62   Well controlled with felodipine 5 mg daily and losartan 50 mg daily regularly Takes HCTZ PRN for leg swelling Counseled for compliance with the medications Advised DASH diet and moderate exercise/walking as tolerated

## 2022-07-24 NOTE — Progress Notes (Signed)
Established Patient Office Visit  Subjective:  Patient ID: Angelica Grimes, female    DOB: 1934-12-21  Age: 87 y.o. MRN: [id number]  CC:  Chief Complaint  Patient presents with   Follow-up    4 month follow up HTN and hypothyroidism     HPI HAISLEY Grimes is a 87 y.o. female with past medical history of HTN, hypothyroidism, DCIS of right breast and urge incontinence who presents for f/u of her chronic medical conditions.  HTN: BP is well-controlled. Takes medications regularly. Patient denies headache, dizziness, chest pain, dyspnea or palpitations.  Hypothyroidism: Her last TSH and free T4 both were borderline elevated.  She is currently taking Synthroid 75 mcg QD.  She denies any recent change in weight or appetite.  Denies any tremors or palpitations.  She complains of urinary urgency and nocturia.  Denies any dysuria or hematuria.  Denies any fever, chills or nausea/vomiting.  She agrees for scheduled voiding and Kegel exercises for now.  Past Medical History:  Diagnosis Date   Anxiety    Brain tumor (benign) ([place])    Cancer ([place])    breast   Cancer ([place])    thyroid   Fracture ankle fracture 01/24/16   GERD (gastroesophageal reflux disease)    Hyperlipidemia    Hypertension    Hypothyroidism    IBS (irritable bowel syndrome)    Low iron stores 11/08/2016   Positive colorectal cancer screening using Cologuard test 05/01/2018    Past Surgical History:  Procedure Laterality Date   breast cyst surgery      x 2 - benign    BREAST LUMPECTOMY     right breast with re-excision   BREAST SURGERY  1966, 1991, 1998   biopsy   BUNIONECTOMY  2012   right foot   COLONOSCOPY     [place]   due to cancer   TONSILLECTOMY      Family History  Problem Relation Age of Onset   Stroke Mother    Pneumonia Mother    Cancer Father        bladder, prostate   Pneumonia Father    Cancer Sister        kidney   Cancer Brother        lung   Diabetes  Sister    Sarcoidosis Sister    Cancer Half-Sister        kidney cancer   Cancer Half-Sister        lung cancer   Atrial fibrillation Brother    Atrial fibrillation Brother    Atrial fibrillation Brother    Colon cancer Neg Hx     Social History   Socioeconomic History   Marital status: Widowed    Spouse name: Not on file   Number of children: 2   Years of education: 54   Highest education level: Not on file  Occupational History   Not on file  Tobacco Use   Smoking status: Never   Smokeless tobacco: Never  Vaping Use   Vaping Use: Never used  Substance and Sexual Activity   Alcohol use: Yes    Comment: rare   Drug use: No   Sexual activity: Not Currently  Other Topics Concern   Not on file  Social History Narrative   Lives with grandson      Widow   2 children   1: [place] Daughter-Letitia (has permission to get info) 2 children: 1 son and daughter (  son lives with her)   2: [place] Son[name] 1 son      No pets      Diet: eats what she wants   Caffeine: none,  Maybe a coke at times   Water: 3-4 bottles of 16 oz      Wear seat belt   Wear sun protection if needed   Smoke detectors and Security system    Does not use phone in car         Social Determinants of Health   Financial Resource Strain: Low Risk  (04/20/2022)   Overall Financial Resource Strain (CARDIA)    Difficulty of Paying Living Expenses: Not hard at all  Food Insecurity: No Food Insecurity (04/20/2022)   Hunger Vital Sign    Worried About Running Out of Food in the Last Year: Never true    [place] in the Last Year: Never true  Transportation Needs: No Transportation Needs (04/20/2022)   PRAPARE - Hydrologist (Medical): No    Lack of Transportation (Non-Medical): No  Physical Activity: Inactive (04/20/2022)   Exercise Vital Sign    Days of Exercise per Week: 0 days    Minutes of Exercise per Session: 0 min  Stress: No Stress Concern Present (04/20/2022)    [place]    Feeling of Stress : Not at all  Social Connections: Moderately Isolated (04/20/2022)   Social Connection and Isolation Panel [NHANES]    Frequency of Communication with Friends and Family: More than three times a week    Frequency of Social Gatherings with Friends and Family: More than three times a week    Attends Religious Services: More than 4 times per year    Active Member of [employer] or Organizations: No    Attends Archivist Meetings: Never    Marital Status: Widowed  Intimate Partner Violence: Not At Risk (04/20/2022)   Humiliation, Afraid, Rape, and Kick questionnaire    Fear of Current or Ex-Partner: No    Emotionally Abused: No    Physically Abused: No    Sexually Abused: No    Outpatient Medications Prior to Visit  Medication Sig Dispense Refill   ALPRAZolam (XANAX) 0.5 MG tablet Take 0.5-1 tablets (0.25-0.5 mg total) by mouth 2 (two) times daily as needed for anxiety. 30 tablet 0   co-enzyme Q-10 50 MG capsule Take 100 mg by mouth daily.     Cyanocobalamin (VITAMIN B 12 PO) Take 1,000 mcg by mouth.     felodipine (PLENDIL) 5 MG 24 hr tablet TAKE 1 TABLET BY MOUTH EVERY DAY 90 tablet 3   Glucosamine-Chondroitin (OSTEO BI-FLEX REGULAR STRENGTH PO) Take 2 tablets by mouth daily.     hydrochlorothiazide (HYDRODIURIL) 25 MG tablet Take 25 mg by mouth. As needed for swelling     losartan (COZAAR) 50 MG tablet TAKE 1 TABLET BY MOUTH TWICE DAILY 180 tablet 1   omeprazole (PRILOSEC) 20 MG capsule TAKE ONE CAPSULE BY MOUTH DAILY 90 capsule 3   rosuvastatin (CRESTOR) 5 MG tablet TAKE 1 TABLET BY MOUTH DAILY 90 tablet 3   SYNTHROID 75 MCG tablet TAKE 1 TABLET BY MOUTH DAILY 90 tablet 3   zinc gluconate 50 MG tablet Take 50 mg by mouth daily.     No facility-administered medications prior to visit.    Allergies  Allergen Reactions   Shrimp [Shellfish Allergy] Hives    Shrimp only   Aspirin  Nausea Only and Other (See Comments)    Tightness of chest   Benazepril Swelling    Entire face, lips, throat   Other    Penicillins Hives, Itching and Rash    Abdominal area only   Clonidine Derivatives Other (See Comments)    Dryness of mouth, lowers blood pressure and pulse    Orange Fruit [Citrus] Rash    Fever blisters and blisters on knuckles     ROS Review of Systems  Constitutional:  Negative for chills and fever.  HENT:  Negative for congestion, sinus pressure, sinus pain and sore throat.   Eyes:  Negative for pain and discharge.  Respiratory:  Negative for cough and shortness of breath.   Cardiovascular:  Negative for chest pain and palpitations.  Gastrointestinal:  Negative for abdominal pain, constipation, diarrhea, nausea and vomiting.  Endocrine: Negative for polydipsia and polyuria.  Genitourinary:  Positive for urgency. Negative for dysuria and hematuria.  Musculoskeletal:  Negative for neck pain and neck stiffness.  Skin:  Negative for rash.  Neurological:  Negative for dizziness and weakness.  Psychiatric/Behavioral:  Negative for agitation and behavioral problems.       Objective:    Physical Exam Vitals reviewed.  Constitutional:      General: She is not in acute distress.    Appearance: She is not diaphoretic.  HENT:     Head: Normocephalic and atraumatic.     Nose: Nose normal. No congestion.     Mouth/Throat:     Mouth: Mucous membranes are moist.     Pharynx: No posterior oropharyngeal erythema.  Eyes:     General: No scleral icterus.    Extraocular Movements: Extraocular movements intact.  Cardiovascular:     Rate and Rhythm: Normal rate and regular rhythm.     Pulses: Normal pulses.     Heart sounds: Normal heart sounds. No murmur heard. Pulmonary:     Breath sounds: Normal breath sounds. No wheezing or rales.  Musculoskeletal:     Cervical back: Neck supple. No tenderness.     Right lower leg: No edema.     Left lower leg: No edema.   Skin:    General: Skin is warm.     Findings: No rash.  Neurological:     General: No focal deficit present.     Mental Status: She is alert and oriented to person, place, and time.     Sensory: No sensory deficit.     Motor: No weakness.  Psychiatric:        Mood and Affect: Mood normal.        Behavior: Behavior normal.     BP 132/62 (BP Location: Left Arm, Patient Position: Sitting, Cuff Size: Normal)   Pulse 75   Resp 18   Ht 5' 4.5" (1.638 m)   Wt 141 lb 3.2 oz (64 kg)   SpO2 98%   BMI 23.86 kg/m  Wt Readings from Last 3 Encounters:  07/24/22 141 lb 3.2 oz (64 kg)  03/24/22 136 lb 9.6 oz (62 kg)  11/23/21 135 lb 12.8 oz (61.6 kg)    Lab Results  Component Value Date   TSH 5.530 (H) 03/24/2022   Lab Results  Component Value Date   WBC 5.5 04/11/2021   HGB 11.7 04/11/2021   HCT 34.4 04/11/2021   MCV 84 04/11/2021   PLT 162 04/11/2021   Lab Results  Component Value Date   NA 140 11/23/2021   K 4.0 11/23/2021   CO2  22 11/23/2021   GLUCOSE 93 11/23/2021   BUN 17 11/23/2021   CREATININE 0.88 11/23/2021   BILITOT 0.6 11/23/2021   ALKPHOS 79 11/23/2021   AST 21 11/23/2021   ALT 23 11/23/2021   PROT 6.7 11/23/2021   ALBUMIN 4.6 11/23/2021   CALCIUM 9.7 11/23/2021   ANIONGAP 7 05/08/2019   EGFR 65 11/23/2021   Lab Results  Component Value Date   CHOL 155 03/24/2022   Lab Results  Component Value Date   HDL 56 03/24/2022   Lab Results  Component Value Date   LDLCALC 84 03/24/2022   Lab Results  Component Value Date   TRIG 76 03/24/2022   Lab Results  Component Value Date   CHOLHDL 2.8 03/24/2022   Lab Results  Component Value Date   HGBA1C 6.0 (H) 04/11/2021      Assessment & Plan:   Problem List Items Addressed This Visit       Cardiovascular and Mediastinum   Hypertension    BP Readings from Last 1 Encounters:  07/24/22 132/62  Well controlled with felodipine 5 mg daily and losartan 50 mg daily regularly Takes HCTZ PRN for  leg swelling Counseled for compliance with the medications Advised DASH diet and moderate exercise/walking as tolerated        Endocrine   Postsurgical hypothyroidism - Primary    Lab Results  Component Value Date   TSH 5.530 (H) 03/24/2022  Takes brand Synthroid as she did not tolerate generic Levothyroxine in the past Had borderline elevated free T4 last time, check TSH and free T4 today      Relevant Orders   TSH + free T4     Nervous and Auditory   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[place])    Noted on MRI of brain, stable in size Does not have surrounding edema, likely benign mass She is currently asymptomatic      Relevant Orders   CBC with Differential/Platelet   Meningioma ([place])    Last MRI brain reviewed, stable sized meningioma She is asymptomatic      Relevant Orders   CBC with Differential/Platelet     Other   Hyperlipidemia    On Crestor      Situational anxiety    Takes Xanax 0.25 mg PRN, rarely needs it - refilled      Relevant Orders   CBC with Differential/Platelet   Prediabetes    Lab Results  Component Value Date   HGBA1C 6.0 (H) 04/11/2021  Advised to follow low-carb diet for now      Relevant Orders   CMP14+EGFR   Hemoglobin A1c    No orders of the defined types were placed in this encounter.   Follow-up: Return in about 4 months (around 11/24/2022) for HTN and hypothyroidism.    Lindell Spar, MD

## 2022-07-24 NOTE — Assessment & Plan Note (Signed)
On Crestor 

## 2022-07-24 NOTE — Patient Instructions (Signed)
Please continue taking medications as prescribed.  Please continue to follow low salt diet and ambulate as tolerated.  Please take Magnesium supplement 200 mg once a day.

## 2022-07-24 NOTE — Assessment & Plan Note (Signed)
Last MRI brain reviewed, stable sized meningioma She is asymptomatic

## 2022-07-24 NOTE — Assessment & Plan Note (Signed)
Takes Xanax 0.25 mg PRN, rarely needs it - refilled

## 2022-07-25 LAB — CBC WITH DIFFERENTIAL/PLATELET
Basophils Absolute: 0 10*3/uL (ref 0.0–0.2)
Basos: 1 %
EOS (ABSOLUTE): 0.2 10*3/uL (ref 0.0–0.4)
Eos: 3 %
Hematocrit: 38.1 % (ref 34.0–46.6)
Hemoglobin: 12.5 g/dL (ref 11.1–15.9)
Immature Grans (Abs): 0 10*3/uL (ref 0.0–0.1)
Immature Granulocytes: 0 %
Lymphocytes Absolute: 2.2 10*3/uL (ref 0.7–3.1)
Lymphs: 36 %
MCH: 28.5 pg (ref 26.6–33.0)
MCHC: 32.8 g/dL (ref 31.5–35.7)
MCV: 87 fL (ref 79–97)
Monocytes Absolute: 0.5 10*3/uL (ref 0.1–0.9)
Monocytes: 8 %
Neutrophils Absolute: 3 10*3/uL (ref 1.4–7.0)
Neutrophils: 52 %
Platelets: 184 10*3/uL (ref 150–450)
RBC: 4.38 x10E6/uL (ref 3.77–5.28)
RDW: 13.8 % (ref 11.7–15.4)
WBC: 6 10*3/uL (ref 3.4–10.8)

## 2022-07-25 LAB — CMP14+EGFR
ALT: 20 IU/L (ref 0–32)
AST: 20 IU/L (ref 0–40)
Albumin/Globulin Ratio: 2 (ref 1.2–2.2)
Albumin: 4.5 g/dL (ref 3.7–4.7)
Alkaline Phosphatase: 74 IU/L (ref 44–121)
BUN/Creatinine Ratio: 21 (ref 12–28)
BUN: 17 mg/dL (ref 8–27)
Bilirubin Total: 0.6 mg/dL (ref 0.0–1.2)
CO2: 23 mmol/L (ref 20–29)
Calcium: 9.2 mg/dL (ref 8.7–10.3)
Chloride: 100 mmol/L (ref 96–106)
Creatinine, Ser: 0.82 mg/dL (ref 0.57–1.00)
Globulin, Total: 2.3 g/dL (ref 1.5–4.5)
Glucose: 101 mg/dL — ABNORMAL HIGH (ref 70–99)
Potassium: 4 mmol/L (ref 3.5–5.2)
Sodium: 139 mmol/L (ref 134–144)
Total Protein: 6.8 g/dL (ref 6.0–8.5)
eGFR: 70 mL/min/{1.73_m2} (ref >59)

## 2022-07-25 LAB — TSH+FREE T4
Free T4: 2.17 ng/dL — ABNORMAL HIGH (ref 0.82–1.77)
TSH: 2.1 u[IU]/mL (ref 0.450–4.500)

## 2022-07-25 LAB — HEMOGLOBIN A1C
Est. average glucose Bld gHb Est-mCnc: 128 mg/dL
Hgb A1c MFr Bld: 6.1 % — ABNORMAL HIGH (ref 4.8–5.6)

## 2022-07-26 ENCOUNTER — Ambulatory Visit (HOSPITAL_COMMUNITY)
Admission: RE | Admit: 2022-07-26 | Discharge: 2022-07-26 | Disposition: A | Discharge status: Home / Self Care | Payer: Medicare Other | Source: Ambulatory Visit | Attending: Internal Medicine | Admitting: Internal Medicine

## 2022-07-26 DIAGNOSIS — Z1231 Encounter for screening mammogram for malignant neoplasm of breast: Secondary | ICD-10-CM | POA: Insufficient documentation

## 2022-08-21 ENCOUNTER — Other Ambulatory Visit: Payer: Self-pay | Admitting: Internal Medicine

## 2022-08-21 DIAGNOSIS — E785 Hyperlipidemia, unspecified: Secondary | ICD-10-CM

## 2022-11-24 ENCOUNTER — Ambulatory Visit: Payer: Medicare Other | Admitting: Internal Medicine

## 2022-12-04 ENCOUNTER — Encounter: Payer: Self-pay | Admitting: Internal Medicine

## 2022-12-04 ENCOUNTER — Ambulatory Visit (INDEPENDENT_AMBULATORY_CARE_PROVIDER_SITE_OTHER): Payer: Medicare Other | Admitting: Internal Medicine

## 2022-12-04 VITALS — BP 136/72 | HR 68 | Ht 64.5 in | Wt 148.4 lb

## 2022-12-04 DIAGNOSIS — E890 Postprocedural hypothyroidism: Secondary | ICD-10-CM | POA: Diagnosis not present

## 2022-12-04 DIAGNOSIS — D432 Neoplasm of uncertain behavior of brain, unspecified: Secondary | ICD-10-CM | POA: Diagnosis not present

## 2022-12-04 DIAGNOSIS — I10 Essential (primary) hypertension: Secondary | ICD-10-CM | POA: Diagnosis not present

## 2022-12-04 DIAGNOSIS — R7303 Prediabetes: Secondary | ICD-10-CM

## 2022-12-04 MED ORDER — LOSARTAN POTASSIUM 50 MG PO TABS: 50.0000 mg | ORAL_TABLET | Freq: Two times a day (BID) | ORAL | 1 refills | Status: DC

## 2022-12-04 NOTE — Patient Instructions (Signed)
Please continue taking medications as prescribed.  Please continue to follow low salt diet and ambulate as tolerated. 

## 2022-12-04 NOTE — Assessment & Plan Note (Addendum)
BP Readings from Last 1 Encounters:  12/04/22 138/62   Well controlled with felodipine 5 mg daily and losartan 50 mg daily regularly Takes HCTZ PRN for leg swelling, but has not taken it recently Episodes of light headedness/presyncope could be due to orthostatic hypotension, advised to maintain adequate hydration Counseled for compliance with the medications Advised DASH diet and moderate exercise/walking as tolerated

## 2022-12-04 NOTE — Assessment & Plan Note (Signed)
Lab Results  Component Value Date   HGBA1C 6.1 (H) 07/24/2022   Advised to follow low-carb diet for now

## 2022-12-04 NOTE — Progress Notes (Signed)
Established Patient Office Visit  Subjective:  Patient ID: Angelica Grimes, female    DOB: 07-02-1935  Age: 88 y.o. MRN: [id number]  CC:  Chief Complaint  Patient presents with   Hypertension    Follow up   Hypothyroidism    Follow up    HPI Angelica Grimes is a 88 y.o. female with past medical history of HTN, hypothyroidism, DCIS of right breast and urge incontinence who presents for f/u of her chronic medical conditions.  HTN: BP is well-controlled. Takes medications regularly. Patient denies headache, chest pain, dyspnea or palpitations. She has had  intermittent lightheadedness at times when changing the position.  Hypothyroidism: Her last TSH was wnl and free T4 was borderline elevated.  She is currently taking Synthroid 75 mcg QD.  She denies any recent change in weight or appetite.  Denies any tremors or palpitations.   Past Medical History:  Diagnosis Date   Anxiety    Brain tumor (benign) ([place])    Cancer ([place])    breast   Cancer ([place])    thyroid   Fracture ankle fracture 01/24/16   GERD (gastroesophageal reflux disease)    Hyperlipidemia    Hypertension    Hypothyroidism    IBS (irritable bowel syndrome)    Low iron stores 11/08/2016   Positive colorectal cancer screening using Cologuard test 05/01/2018    Past Surgical History:  Procedure Laterality Date   breast cyst surgery      x 2 - benign    BREAST LUMPECTOMY     right breast with re-excision   BREAST SURGERY  1966, 1991, 1998   biopsy   BUNIONECTOMY  2012   right foot   COLONOSCOPY     [place]   due to cancer   TONSILLECTOMY      Family History  Problem Relation Age of Onset   Stroke Mother    Pneumonia Mother    Cancer Father        bladder, prostate   Pneumonia Father    Cancer Sister        kidney   Cancer Brother        lung   Diabetes Sister    Sarcoidosis Sister    Cancer Half-Sister        kidney cancer   Cancer Half-Sister        lung cancer    Atrial fibrillation Brother    Atrial fibrillation Brother    Atrial fibrillation Brother    Colon cancer Neg Hx     Social History   Socioeconomic History   Marital status: Widowed    Spouse name: Not on file   Number of children: 2   Years of education: 34   Highest education level: Not on file  Occupational History   Not on file  Tobacco Use   Smoking status: Never   Smokeless tobacco: Never  Vaping Use   Vaping Use: Never used  Substance and Sexual Activity   Alcohol use: Yes    Comment: rare   Drug use: No   Sexual activity: Not Currently  Other Topics Concern   Not on file  Social History Narrative   Lives with grandson      Widow   2 children   1: [place] Daughter-Letitia (has permission to get info) 2 children: 1 son and daughter (son lives with her)   2: [place] Son[name] 1 son      No  pets      Diet: eats what she wants   Caffeine: none,  Maybe a coke at times   Water: 3-4 bottles of 16 oz      Wear seat belt   Wear sun protection if needed   Smoke detectors and Security system    Does not use phone in car         Social Determinants of Health   Financial Resource Strain: Low Risk  (04/20/2022)   Overall Financial Resource Strain (CARDIA)    Difficulty of Paying Living Expenses: Not hard at all  Food Insecurity: No Food Insecurity (04/20/2022)   Hunger Vital Sign    Worried About Running Out of Food in the Last Year: Never true    [place] in the Last Year: Never true  Transportation Needs: No Transportation Needs (04/20/2022)   PRAPARE - Hydrologist (Medical): No    Lack of Transportation (Non-Medical): No  Physical Activity: Inactive (04/20/2022)   Exercise Vital Sign    Days of Exercise per Week: 0 days    Minutes of Exercise per Session: 0 min  Stress: No Stress Concern Present (04/20/2022)   [place]    Feeling of Stress : Not at all  Social  Connections: Moderately Isolated (04/20/2022)   Social Connection and Isolation Panel [NHANES]    Frequency of Communication with Friends and Family: More than three times a week    Frequency of Social Gatherings with Friends and Family: More than three times a week    Attends Religious Services: More than 4 times per year    Active Member of [employer] or Organizations: No    Attends Archivist Meetings: Never    Marital Status: Widowed  Intimate Partner Violence: Not At Risk (04/20/2022)   Humiliation, Afraid, Rape, and Kick questionnaire    Fear of Current or Ex-Partner: No    Emotionally Abused: No    Physically Abused: No    Sexually Abused: No    Outpatient Medications Prior to Visit  Medication Sig Dispense Refill   ALPRAZolam (XANAX) 0.5 MG tablet Take 0.5-1 tablets (0.25-0.5 mg total) by mouth 2 (two) times daily as needed for anxiety. 30 tablet 0   co-enzyme Q-10 50 MG capsule Take 100 mg by mouth daily.     Cyanocobalamin (VITAMIN B 12 PO) Take 1,000 mcg by mouth.     felodipine (PLENDIL) 5 MG 24 hr tablet TAKE 1 TABLET BY MOUTH EVERY DAY 90 tablet 3   Glucosamine-Chondroitin (OSTEO BI-FLEX REGULAR STRENGTH PO) Take 2 tablets by mouth daily.     hydrochlorothiazide (HYDRODIURIL) 25 MG tablet Take 25 mg by mouth. As needed for swelling     omeprazole (PRILOSEC) 20 MG capsule TAKE ONE CAPSULE BY MOUTH DAILY 90 capsule 3   rosuvastatin (CRESTOR) 5 MG tablet TAKE 1 TABLET BY MOUTH DAILY 90 tablet 3   SYNTHROID 75 MCG tablet TAKE 1 TABLET BY MOUTH DAILY 90 tablet 3   zinc gluconate 50 MG tablet Take 50 mg by mouth daily.     losartan (COZAAR) 50 MG tablet TAKE 1 TABLET BY MOUTH TWICE DAILY 180 tablet 1   No facility-administered medications prior to visit.    Allergies  Allergen Reactions   Shrimp [Shellfish Allergy] Hives    Shrimp only   Aspirin Nausea Only and Other (See Comments)    Tightness of chest   Benazepril Swelling  Entire face, lips, throat   Other     Penicillins Hives, Itching and Rash    Abdominal area only   Clonidine Derivatives Other (See Comments)    Dryness of mouth, lowers blood pressure and pulse    Orange Fruit [Citrus] Rash    Fever blisters and blisters on knuckles     ROS Review of Systems  Constitutional:  Negative for chills and fever.  HENT:  Negative for congestion, sinus pressure, sinus pain and sore throat.   Eyes:  Negative for pain and discharge.  Respiratory:  Negative for cough and shortness of breath.   Cardiovascular:  Negative for chest pain and palpitations.  Gastrointestinal:  Negative for abdominal pain, constipation, diarrhea, nausea and vomiting.  Endocrine: Negative for polydipsia and polyuria.  Genitourinary:  Positive for urgency. Negative for dysuria and hematuria.  Musculoskeletal:  Negative for neck pain and neck stiffness.  Skin:  Negative for rash.  Neurological:  Positive for light-headedness. Negative for syncope and weakness.  Psychiatric/Behavioral:  Negative for agitation and behavioral problems.       Objective:    Physical Exam Vitals reviewed.  Constitutional:      General: She is not in acute distress.    Appearance: She is not diaphoretic.  HENT:     Head: Normocephalic and atraumatic.     Nose: Nose normal. No congestion.     Mouth/Throat:     Mouth: Mucous membranes are moist.     Pharynx: No posterior oropharyngeal erythema.  Eyes:     General: No scleral icterus.    Extraocular Movements: Extraocular movements intact.  Cardiovascular:     Rate and Rhythm: Normal rate and regular rhythm.     Pulses: Normal pulses.     Heart sounds: Normal heart sounds. No murmur heard. Pulmonary:     Breath sounds: Normal breath sounds. No wheezing or rales.  Musculoskeletal:     Cervical back: Neck supple. No tenderness.     Right lower leg: No edema.     Left lower leg: No edema.  Skin:    General: Skin is warm.     Findings: No rash.  Neurological:     General: No focal  deficit present.     Mental Status: She is alert and oriented to person, place, and time.     Sensory: No sensory deficit.     Motor: No weakness.  Psychiatric:        Mood and Affect: Mood normal.        Behavior: Behavior normal.     BP 136/72 (BP Location: Left Arm, Cuff Size: Normal)   Pulse 68   Ht 5' 4.5" (1.638 m)   Wt 148 lb 6.4 oz (67.3 kg)   SpO2 95%   BMI 25.08 kg/m  Wt Readings from Last 3 Encounters:  12/04/22 148 lb 6.4 oz (67.3 kg)  07/24/22 141 lb 3.2 oz (64 kg)  03/24/22 136 lb 9.6 oz (62 kg)    Lab Results  Component Value Date   TSH 2.100 07/24/2022   Lab Results  Component Value Date   WBC 6.0 07/24/2022   HGB 12.5 07/24/2022   HCT 38.1 07/24/2022   MCV 87 07/24/2022   PLT 184 07/24/2022   Lab Results  Component Value Date   NA 139 07/24/2022   K 4.0 07/24/2022   CO2 23 07/24/2022   GLUCOSE 101 (H) 07/24/2022   BUN 17 07/24/2022   CREATININE 0.82 07/24/2022   BILITOT 0.6 07/24/2022  ALKPHOS 74 07/24/2022   AST 20 07/24/2022   ALT 20 07/24/2022   PROT 6.8 07/24/2022   ALBUMIN 4.5 07/24/2022   CALCIUM 9.2 07/24/2022   ANIONGAP 7 05/08/2019   EGFR 70 07/24/2022   Lab Results  Component Value Date   CHOL 155 03/24/2022   Lab Results  Component Value Date   HDL 56 03/24/2022   Lab Results  Component Value Date   LDLCALC 84 03/24/2022   Lab Results  Component Value Date   TRIG 76 03/24/2022   Lab Results  Component Value Date   CHOLHDL 2.8 03/24/2022   Lab Results  Component Value Date   HGBA1C 6.1 (H) 07/24/2022      Assessment & Plan:   Problem List Items Addressed This Visit       Cardiovascular and Mediastinum   Hypertension - Primary    BP Readings from Last 1 Encounters:  12/04/22 138/62  Well controlled with felodipine 5 mg daily and losartan 50 mg daily regularly Takes HCTZ PRN for leg swelling, but has not taken it recently Episodes of light headedness/presyncope could be due to orthostatic hypotension,  advised to maintain adequate hydration Counseled for compliance with the medications Advised DASH diet and moderate exercise/walking as tolerated      Relevant Medications   losartan (COZAAR) 50 MG tablet   Other Relevant Orders   CMP14+EGFR     Endocrine   Postsurgical hypothyroidism    Lab Results  Component Value Date   TSH 2.100 07/24/2022  Takes brand Synthroid as she did not tolerate generic Levothyroxine in the past Had borderline elevated free T4 last time, check TSH and free T4 today      Relevant Orders   TSH + free T4     Nervous and Auditory   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    Dispense:  180 tablet    Refill:  1    Follow-up: Return in about 6 months (around 06/06/2023) for HTN and hypothyroidism.    Lindell Spar, MD

## 2022-12-04 NOTE — Assessment & Plan Note (Addendum)
Noted on MRI of brain, stable in size Does not have surrounding edema, likely benign mass Dizziness unlikely due to stable sized benign mass

## 2022-12-04 NOTE — Assessment & Plan Note (Signed)
Lab Results  Component Value Date   TSH 2.100 07/24/2022   Takes brand Synthroid as she did not tolerate generic Levothyroxine in the past Had borderline elevated free T4 last time, check TSH and free T4 today

## 2022-12-05 LAB — CMP14+EGFR
ALT: 14 IU/L (ref 0–32)
AST: 20 IU/L (ref 0–40)
Albumin/Globulin Ratio: 1.6 (ref 1.2–2.2)
Albumin: 4.3 g/dL (ref 3.7–4.7)
Alkaline Phosphatase: 65 IU/L (ref 44–121)
BUN/Creatinine Ratio: 25 (ref 12–28)
BUN: 18 mg/dL (ref 8–27)
Bilirubin Total: 0.5 mg/dL (ref 0.0–1.2)
CO2: 22 mmol/L (ref 20–29)
Calcium: 8.8 mg/dL (ref 8.7–10.3)
Chloride: 104 mmol/L (ref 96–106)
Creatinine, Ser: 0.72 mg/dL (ref 0.57–1.00)
Globulin, Total: 2.7 g/dL (ref 1.5–4.5)
Glucose: 91 mg/dL (ref 70–99)
Potassium: 4.1 mmol/L (ref 3.5–5.2)
Sodium: 145 mmol/L — ABNORMAL HIGH (ref 134–144)
Total Protein: 7 g/dL (ref 6.0–8.5)
eGFR: 82 mL/min/{1.73_m2} (ref >59)

## 2022-12-05 LAB — TSH+FREE T4
Free T4: 1.63 ng/dL (ref 0.82–1.77)
TSH: 6.17 u[IU]/mL — ABNORMAL HIGH (ref 0.450–4.500)

## 2022-12-05 LAB — HEMOGLOBIN A1C
Est. average glucose Bld gHb Est-mCnc: 134 mg/dL
Hgb A1c MFr Bld: 6.3 % — ABNORMAL HIGH (ref 4.8–5.6)

## 2022-12-06 ENCOUNTER — Encounter: Payer: Self-pay | Admitting: Internal Medicine

## 2023-02-16 ENCOUNTER — Other Ambulatory Visit: Payer: Self-pay | Admitting: Internal Medicine

## 2023-02-16 DIAGNOSIS — I10 Essential (primary) hypertension: Secondary | ICD-10-CM

## 2023-03-20 ENCOUNTER — Encounter: Payer: Self-pay | Admitting: Internal Medicine

## 2023-03-20 ENCOUNTER — Other Ambulatory Visit: Payer: Self-pay | Admitting: Internal Medicine

## 2023-03-20 DIAGNOSIS — F418 Other specified anxiety disorders: Secondary | ICD-10-CM

## 2023-03-20 MED ORDER — ALPRAZOLAM 0.5 MG PO TABS: 0.2500 mg | ORAL_TABLET | Freq: Two times a day (BID) | ORAL | 0 refills | Status: DC | PRN

## 2023-04-24 ENCOUNTER — Other Ambulatory Visit: Payer: Self-pay | Admitting: Internal Medicine

## 2023-05-14 ENCOUNTER — Encounter (HOSPITAL_COMMUNITY): Payer: Self-pay | Admitting: Adult Health

## 2023-05-14 ENCOUNTER — Ambulatory Visit (INDEPENDENT_AMBULATORY_CARE_PROVIDER_SITE_OTHER): Payer: Medicare Other

## 2023-05-14 VITALS — Ht 65.0 in | Wt 146.0 lb

## 2023-05-14 DIAGNOSIS — Z1329 Encounter for screening for other suspected endocrine disorder: Secondary | ICD-10-CM

## 2023-05-14 DIAGNOSIS — R7303 Prediabetes: Secondary | ICD-10-CM

## 2023-05-14 DIAGNOSIS — Z0000 Encounter for general adult medical examination without abnormal findings: Secondary | ICD-10-CM | POA: Diagnosis not present

## 2023-05-14 NOTE — Progress Notes (Unsigned)
Subjective:   Angelica Grimes is a 88 y.o. female who presents for Medicare Annual (Subsequent) preventive examination.  Visit Complete: In person  Patient Medicare AWV questionnaire was completed by the patient on 8/12; I have confirmed that all information answered by patient is correct and no changes since this date.  Review of Systems     Cardiac Risk Factors include: advanced age (>64men, >53 women)     Objective:    Today's Vitals   05/14/23 0933  Weight: 146 lb (66.2 kg)  Height: 5\' 5"  (1.651 m)   Body mass index is 24.3 kg/m.     05/14/2023    9:39 AM 04/20/2022    9:51 AM 04/18/2021    9:46 AM 03/16/2021    2:16 PM 05/19/2019    2:40 PM 06/18/2018   10:33 AM 11/01/2017    2:00 PM  Advanced Directives  Does Patient Have a Medical Advance Directive? Yes Yes Yes Yes No;Yes Yes Yes  Type of Advance Directive Living will Healthcare Power of [place];Living will Healthcare Power of [place];Living will Healthcare Power of [place];Living will Healthcare Power of [place];Living will Healthcare Power of [place];Living will Healthcare Power of [place];Living will  Does patient want to make changes to medical advance directive? Yes (Inpatient - patient defers changing a medical advance directive at this time - Information given) No - Patient declined  No - Patient declined  No - Patient declined   Copy of Healthcare Power of Attorney in Chart?  No - copy requested No - copy requested No - copy requested No - copy requested No - copy requested No - copy requested  Would patient like information on creating a medical advance directive?     No - Patient declined      Current Medications (verified) Outpatient Encounter Medications as of 05/14/2023  Medication Sig   ALPRAZolam (XANAX) 0.5 MG tablet Take 0.5-1 tablets (0.25-0.5 mg total) by mouth 2 (two) times daily as needed for anxiety.   co-enzyme Q-10 50 MG capsule Take 100 mg by mouth daily.   Cyanocobalamin (VITAMIN B 12  PO) Take 1,000 mcg by mouth.   felodipine (PLENDIL) 5 MG 24 hr tablet TAKE 1 TABLET BY MOUTH EVERY DAY   Glucosamine-Chondroitin (OSTEO BI-FLEX REGULAR STRENGTH PO) Take 2 tablets by mouth daily.   hydrochlorothiazide (HYDRODIURIL) 25 MG tablet Take 25 mg by mouth. As needed for swelling   losartan (COZAAR) 50 MG tablet Take 1 tablet (50 mg total) by mouth 2 (two) times daily.   omeprazole (PRILOSEC) 20 MG capsule TAKE ONE CAPSULE BY MOUTH DAILY   rosuvastatin (CRESTOR) 5 MG tablet TAKE 1 TABLET BY MOUTH DAILY   SYNTHROID 75 MCG tablet TAKE 1 TABLET BY MOUTH DAILY   zinc gluconate 50 MG tablet Take 50 mg by mouth daily.   No facility-administered encounter medications on file as of 05/14/2023.    Allergies (verified) Shrimp [shellfish allergy], Aspirin, Benazepril, Other, Penicillins, Clonidine derivatives, and Orange fruit [citrus]   History: Past Medical History:  Diagnosis Date   Anxiety    Brain tumor (benign) (HCC)    Cancer (HCC)    breast   Cancer (HCC)    thyroid   Fracture ankle fracture 01/24/16   GERD (gastroesophageal reflux disease)    Hyperlipidemia    Hypertension    Hypothyroidism    IBS (irritable bowel syndrome)    Low iron stores 11/08/2016   Positive colorectal cancer screening using Cologuard test 05/01/2018   Past Surgical History:  Procedure Laterality Date   breast cyst surgery      x 2 - benign    BREAST LUMPECTOMY     right breast with re-excision   BREAST SURGERY  1966, 1991, 1998   biopsy   BUNIONECTOMY  2012   right foot   COLONOSCOPY     CYSTECTOMY  1983   THYROIDECTOMY  1991   due to cancer   TONSILLECTOMY     Family History  Problem Relation Age of Onset   Stroke Mother    Pneumonia Mother    Cancer Father        bladder, prostate   Pneumonia Father    Cancer Sister        kidney   Cancer Brother        lung   Diabetes Sister    Sarcoidosis Sister    Cancer Half-Sister        kidney cancer   Cancer Half-Sister        lung  cancer   Atrial fibrillation Brother    Atrial fibrillation Brother    Atrial fibrillation Brother    Colon cancer Neg Hx    Social History   Socioeconomic History   Marital status: Widowed    Spouse name: Not on file   Number of children: 2   Years of education: 73   Highest education level: Not on file  Occupational History   Not on file  Tobacco Use   Smoking status: Never   Smokeless tobacco: Never  Vaping Use   Vaping status: Never Used  Substance and Sexual Activity   Alcohol use: Yes    Comment: rare   Drug use: No   Sexual activity: Not Currently  Other Topics Concern   Not on file  Social History Narrative   Lives with grandson      Widow   2 children   1: [place] Daughter-Letitia (has permission to get info) 2 children: 1 son and daughter (son lives with her)   2: [place] Son-[name] 1 son      No pets      Diet: eats what she wants   Caffeine: none,  Maybe a coke at times   Water: 3-4 bottles of 16 oz      Wear seat belt   Wear sun protection if needed   Smoke detectors and Security system    Does not use phone in car         Social Determinants of Health   Financial Resource Strain: Low Risk  (05/14/2023)   Overall Financial Resource Strain (CARDIA)    Difficulty of Paying Living Expenses: Not hard at all  Food Insecurity: No Food Insecurity (05/14/2023)   Hunger Vital Sign    Worried About Running Out of Food in the Last Year: Never true    Ran Out of Food in the Last Year: Never true  Transportation Needs: No Transportation Needs (05/14/2023)   PRAPARE - Administrator, Civil Service (Medical): No    Lack of Transportation (Non-Medical): No  Physical Activity: Sufficiently Active (05/14/2023)   Exercise Vital Sign    Days of Exercise per Week: 5 days    Minutes of Exercise per Session: 30 min  Stress: Stress Concern Present (05/14/2023)   [employer] of Occupational Health - Occupational Stress Questionnaire    Feeling of Stress : To  some extent  Social Connections: Moderately Integrated (05/14/2023)   Social Connection and Isolation Panel [NHANES]  Frequency of Communication with Friends and Family: More than three times a week    Frequency of Social Gatherings with Friends and Family: Once a week    Attends Religious Services: 1 to 4 times per year    Active Member of [employer] or Organizations: No    Attends Banker Meetings: 1 to 4 times per year    Marital Status: Widowed    Tobacco Counseling Counseling given: Not Answered   Clinical Intake:     Pain : No/denies pain     BMI - recorded: 24.3 Nutritional Status: BMI of 19-24  Normal Diabetes: No  How often do you need to have someone help you when you read instructions, pamphlets, or other written materials from your doctor or pharmacy?: 1 - Never  Interpreter Needed?: No      Activities of Daily Living    05/14/2023    9:35 AM  In your present state of health, do you have any difficulty performing the following activities:  Hearing? 0  Vision? 0  Difficulty concentrating or making decisions? 0  Walking or climbing stairs? 0  Dressing or bathing? 0  Doing errands, shopping? 0  Preparing Food and eating ? N  Using the Toilet? N  In the past six months, have you accidently leaked urine? N  Do you have problems with loss of bowel control? N  Managing your Medications? N  Managing your Finances? N  Housekeeping or managing your Housekeeping? N    Patient Care Team: Anabel Halon, MD as PCP - General (Internal Medicine) Wyline Mood Dorothe Pea, MD as PCP - Cardiology (Cardiology) Lance Bosch, MD as Consulting Physician (Radiation Oncology) Emelia Loron, MD as Consulting Physician (General Surgery) Ernestina Penna, MD (Unknown Physician Specialty) Ruthy Dick, MD (Unknown Physician Specialty) West Bali, MD (Inactive) as Consulting Physician (Gastroenterology) West Bali, MD (Inactive) as Consulting Physician  (Gastroenterology)  Indicate any recent Medical Services you may have received from other than [facility] providers in the past year (date may be approximate).     Assessment:   This is a routine wellness examination for Ahilyn.  Hearing/Vision screen No results found.  Dietary issues and exercise activities discussed:     Goals Addressed             This Visit's Progress    Healthy Nutrition Achieved       Evidence-based guidance:  Assess patient perspective on healthy weight, weight loss or weight gain, motivation and readiness for change.  Recommend or set healthy weight goal based on body mass index.  Review current dietary intake and exercise levels.  Encourage small steps toward making change to eating and exercising.  Provide individualized medical nutrition therapy.   Notes:        Depression Screen    05/14/2023   10:46 AM 05/14/2023    9:40 AM 05/14/2023    9:38 AM 12/04/2022   10:48 AM 07/24/2022    9:40 AM 04/20/2022    9:53 AM 04/20/2022    9:50 AM  PHQ 2/9 Scores  PHQ - 2 Score 0 0 0 0 0 0 0  PHQ- 9 Score    0 0      Fall Risk    05/14/2023    9:39 AM 12/04/2022   10:48 AM 07/24/2022    9:39 AM 04/20/2022    9:53 AM 03/24/2022    9:31 AM  Fall Risk   Falls in the past year? 0 0 0 0 0  Number falls in past yr: 0 0 0 0 0  Injury with Fall? 0 0 0 0 0  Risk for fall due to : No Fall Risks No Fall Risks No Fall Risks No Fall Risks No Fall Risks  Follow up Falls evaluation completed Falls evaluation completed Falls evaluation completed Falls evaluation completed Falls evaluation completed    MEDICARE RISK AT HOME:  Medicare Risk at Home - 05/14/23 0939     Any stairs in or around the home? Yes    If so, are there any without handrails? Yes    Home free of loose throw rugs in walkways, pet beds, electrical cords, etc? Yes    Adequate lighting in your home to reduce risk of falls? Yes    Life alert? No    Use of a cane, walker or w/c? No    Grab bars in the  bathroom? Yes    Shower chair or bench in shower? Yes    Elevated toilet seat or a handicapped toilet? Yes             TIMED UP AND GO:  Was the test performed?  Yes  Length of time to ambulate 10 feet: 3 sec Gait steady and fast without use of assistive device    Cognitive Function:    04/20/2022    9:56 AM  MMSE - Mini Mental State Exam  Not completed: Unable to complete        05/14/2023    9:40 AM 04/20/2022    9:56 AM 04/18/2021    9:48 AM  6CIT Screen  What Year? 0 points 0 points 0 points  What month? 0 points 0 points 0 points  What time? 0 points 0 points 0 points  Count back from 20 0 points 0 points 0 points  Months in reverse 0 points 0 points 0 points  Repeat phrase 0 points 0 points 0 points  Total Score 0 points 0 points 0 points    Immunizations Immunization History  Administered Date(s) Administered   Fluad Quad(high Dose 65+) 07/05/2020, 07/05/2022   Influenza Split 06/19/2012   Influenza,inj,Quad PF,6+ Mos 07/02/2018   Influenza,inj,quad, With Preservative 06/27/2017   Influenza-Unspecified 08/01/2015   Moderna Sars-Covid-2 Vaccination 01/07/2020, 02/03/2020   Pneumococcal Conjugate-13 06/24/2014   Pneumococcal-Unspecified 06/25/2011   Tdap 10/23/2013   Zoster Recombinant(Shingrix) 06/27/2017, 01/29/2018    TDAP status: Up to date  Flu Vaccine status: Declined, Education has been provided regarding the importance of this vaccine but patient still declined. Advised may receive this vaccine at local pharmacy or Health Dept. Aware to provide a copy of the vaccination record if obtained from local pharmacy or Health Dept. Verbalized acceptance and understanding.  Pneumococcal vaccine status: Declined,  Education has been provided regarding the importance of this vaccine but patient still declined. Advised may receive this vaccine at local pharmacy or Health Dept. Aware to provide a copy of the vaccination record if obtained from local pharmacy or  Health Dept. Verbalized acceptance and understanding.   Covid-19 vaccine status: Information provided on how to obtain vaccines.   Qualifies for Shingles Vaccine? Yes   Zostavax completed No   Shingrix Completed?: No.    Education has been provided regarding the importance of this vaccine. Patient has been advised to call insurance company to determine out of pocket expense if they have not yet received this vaccine. Advised may also receive vaccine at local pharmacy or Health Dept. Verbalized acceptance and understanding.  Screening Tests Health Maintenance  Topic Date Due   Pneumonia Vaccine 54+ Years old (2 of 2 - PPSV23 or PCV20) 06/25/2015   COVID-19 Vaccine (3 - Moderna risk series) 03/02/2020   INFLUENZA VACCINE  05/03/2023   DTaP/Tdap/Td (2 - Td or Tdap) 10/24/2023   Medicare Annual Wellness (AWV)  05/13/2024   DEXA SCAN  Completed   Zoster Vaccines- Shingrix  Completed   HPV VACCINES  Aged Out    Health Maintenance  Health Maintenance Due  Topic Date Due   Pneumonia Vaccine 55+ Years old (2 of 2 - PPSV23 or PCV20) 06/25/2015   COVID-19 Vaccine (3 - Moderna risk series) 03/02/2020   INFLUENZA VACCINE  05/03/2023    Colorectal cancer screening: No longer required.   Mammogram: Completed in 2022. Repeat every year.   Bone Density status: Completed 2021. Results reflect: Bone density results: OSTEOPENIA. Repeat every 2 years.    Additional Screening:  Hepatitis C Screening: does not qualify.  Vision Screening: Recommended annual ophthalmology exams for early detection of glaucoma and other disorders of the eye. Is the patient up to date with their annual eye exam?  Yes   Dental Screening: Recommended annual dental exams for proper oral hygiene   Community Resource Referral / Chronic Care Management: CRR required this visit?  No   CCM required this visit?  No     Plan:     I have personally reviewed and noted the following in the patient's chart:    Medical and social history Use of alcohol, tobacco or illicit drugs  Current medications and supplements including opioid prescriptions. Patient is not currently taking opioid prescriptions. Functional ability and status Nutritional status Physical activity Advanced directives List of other physicians Hospitalizations, surgeries, and ER visits in previous 12 months Vitals Screenings to include cognitive, depression, and falls Referrals and appointments  In addition, I have reviewed and discussed with patient certain preventive protocols, quality metrics, and best practice recommendations. A written personalized care plan for preventive services as well as general preventive health recommendations were provided to patient.     Marlana Salvage, CMA   05/14/2023

## 2023-05-18 ENCOUNTER — Other Ambulatory Visit: Payer: Self-pay | Admitting: Internal Medicine

## 2023-06-11 ENCOUNTER — Encounter: Payer: Self-pay | Admitting: Internal Medicine

## 2023-06-11 ENCOUNTER — Ambulatory Visit: Payer: Medicare Other | Admitting: Internal Medicine

## 2023-06-11 ENCOUNTER — Ambulatory Visit (INDEPENDENT_AMBULATORY_CARE_PROVIDER_SITE_OTHER): Payer: Medicare Other | Admitting: Internal Medicine

## 2023-06-11 VITALS — BP 136/70 | HR 65 | Ht 64.5 in | Wt 144.6 lb

## 2023-06-11 DIAGNOSIS — F418 Other specified anxiety disorders: Secondary | ICD-10-CM

## 2023-06-11 DIAGNOSIS — I10 Essential (primary) hypertension: Secondary | ICD-10-CM

## 2023-06-11 DIAGNOSIS — E1169 Type 2 diabetes mellitus with other specified complication: Secondary | ICD-10-CM | POA: Diagnosis not present

## 2023-06-11 DIAGNOSIS — E782 Mixed hyperlipidemia: Secondary | ICD-10-CM

## 2023-06-11 DIAGNOSIS — E890 Postprocedural hypothyroidism: Secondary | ICD-10-CM

## 2023-06-11 DIAGNOSIS — D329 Benign neoplasm of meninges, unspecified: Secondary | ICD-10-CM

## 2023-06-11 MED ORDER — ALPRAZOLAM 0.5 MG PO TABS
0.2500 mg | ORAL_TABLET | Freq: Two times a day (BID) | ORAL | 0 refills | Status: DC | PRN
Start: 2023-06-11 — End: 2023-10-10

## 2023-06-11 NOTE — Patient Instructions (Signed)
Please continue to take medications as prescribed.  Please continue to follow low carb diet and perform moderate exercise/walking as tolerated.  Please get fasting blood tests done before the next visit.

## 2023-06-11 NOTE — Assessment & Plan Note (Addendum)
BP Readings from Last 1 Encounters:  06/11/23 136/70   Well controlled with felodipine 5 mg daily and losartan 50 mg BID regularly Takes HCTZ PRN for leg swelling, but has not taken it recently Counseled for compliance with the medications Advised DASH diet and moderate exercise/walking as tolerated

## 2023-06-11 NOTE — Progress Notes (Signed)
Established Patient Office Visit  Subjective:  Patient ID: Angelica Grimes, female    DOB: [birth year]  Age: 88 y.o. MRN: [id number]  CC:  Chief Complaint  Patient presents with   Hypertension    Six month follow up    Hypothyroidism    Six month follow up     HPI Angelica Grimes is a 88 y.o. female with past medical history of HTN, hypothyroidism, DCIS of right breast and urge incontinence who presents for f/u of her chronic medical conditions.  HTN: BP is well-controlled. Takes medications regularly. Patient denies headache, chest pain, dyspnea or palpitations. She has had  intermittent lightheadedness at times when changing the position.  Hypothyroidism: Her last TSH and free T4 are wnl now.  She is currently taking Synthroid 75 mcg QD.  She denies any recent change in weight or appetite.  Denies any tremors or palpitations.  Type II DM: Her HbA1c is 7.1 now.  She admits that she was not watching over her diet recently.  She agrees to follow low-carb diet.  Denies polyuria or polyphagia currently.  She keeps Xanax for situational anxiety, rarely takes it.  She requests refill of Xanax.  Past Medical History:  Diagnosis Date   Anxiety    Brain tumor (benign) (HCC)    Cancer (HCC)    breast   Cancer (HCC)    thyroid   Fracture ankle fracture 01/24/16   GERD (gastroesophageal reflux disease)    Hyperlipidemia    Hypertension    Hypothyroidism    IBS (irritable bowel syndrome)    Low iron stores 11/08/2016   Positive colorectal cancer screening using Cologuard test 05/01/2018    Past Surgical History:  Procedure Laterality Date   breast cyst surgery      x 2 - benign    BREAST LUMPECTOMY     right breast with re-excision   BREAST SURGERY  1966, 1991, 1998   biopsy   BUNIONECTOMY  2012   right foot   COLONOSCOPY     CYSTECTOMY  1983   THYROIDECTOMY  1991   due to cancer   TONSILLECTOMY      Family History  Problem Relation Age of Onset   Stroke Mother     Pneumonia Mother    Cancer Father        bladder, prostate   Pneumonia Father    Cancer Sister        kidney   Cancer Brother        lung   Diabetes Sister    Sarcoidosis Sister    Cancer Half-Sister        kidney cancer   Cancer Half-Sister        lung cancer   Atrial fibrillation Brother    Atrial fibrillation Brother    Atrial fibrillation Brother    Colon cancer Neg Hx     Social History   Socioeconomic History   Marital status: Widowed    Spouse name: Not on file   Number of children: 2   Years of education: 50   Highest education level: Not on file  Occupational History   Not on file  Tobacco Use   Smoking status: Never   Smokeless tobacco: Never  Vaping Use   Vaping status: Never Used  Substance and Sexual Activity   Alcohol use: Yes    Comment: rare   Drug use: No   Sexual activity: Not Currently  Other Topics Concern   Not on  file  Social History Narrative   Lives with grandson      Widow   2 children   1: [place] Daughter-Letitia (has permission to get info) 2 children: 1 son and daughter (son lives with her)   2: [place] Son-[name] 1 son      No pets      Diet: eats what she wants   Caffeine: none,  Maybe a coke at times   Water: 3-4 bottles of 16 oz      Wear seat belt   Wear sun protection if needed   Smoke detectors and Security system    Does not use phone in car         Social Determinants of Health   Financial Resource Strain: Low Risk  (05/14/2023)   Overall Financial Resource Strain (CARDIA)    Difficulty of Paying Living Expenses: Not hard at all  Food Insecurity: No Food Insecurity (05/14/2023)   Hunger Vital Sign    Worried About Running Out of Food in the Last Year: Never true    Ran Out of Food in the Last Year: Never true  Transportation Needs: No Transportation Needs (05/14/2023)   PRAPARE - Administrator, Civil Service (Medical): No    Lack of Transportation (Non-Medical): No  Physical Activity: Sufficiently Active  (05/14/2023)   Exercise Vital Sign    Days of Exercise per Week: 5 days    Minutes of Exercise per Session: 30 min  Stress: Stress Concern Present (05/14/2023)   [employer] of Occupational Health - Occupational Stress Questionnaire    Feeling of Stress : To some extent  Social Connections: Moderately Integrated (05/14/2023)   Social Connection and Isolation Panel [NHANES]    Frequency of Communication with Friends and Family: More than three times a week    Frequency of Social Gatherings with Friends and Family: Once a week    Attends Religious Services: 1 to 4 times per year    Active Member of [employer] or Organizations: No    Attends Banker Meetings: 1 to 4 times per year    Marital Status: Widowed  Intimate Partner Violence: Not At Risk (05/14/2023)   Humiliation, Afraid, Rape, and Kick questionnaire    Fear of Current or Ex-Partner: No    Emotionally Abused: No    Physically Abused: No    Sexually Abused: No    Outpatient Medications Prior to Visit  Medication Sig Dispense Refill   hydrochlorothiazide (HYDRODIURIL) 25 MG tablet Take 25 mg by mouth daily as needed (Leg swelling).     co-enzyme Q-10 50 MG capsule Take 100 mg by mouth daily.     Cyanocobalamin (VITAMIN B 12 PO) Take 1,000 mcg by mouth.     felodipine (PLENDIL) 5 MG 24 hr tablet TAKE 1 TABLET BY MOUTH EVERY DAY 90 tablet 3   Glucosamine-Chondroitin (OSTEO BI-FLEX REGULAR STRENGTH PO) Take 2 tablets by mouth daily.     losartan (COZAAR) 50 MG tablet Take 1 tablet (50 mg total) by mouth 2 (two) times daily. 180 tablet 1   omeprazole (PRILOSEC) 20 MG capsule TAKE ONE CAPSULE BY MOUTH DAILY 90 capsule 3   rosuvastatin (CRESTOR) 5 MG tablet TAKE 1 TABLET BY MOUTH DAILY 90 tablet 3   SYNTHROID 75 MCG tablet TAKE 1 TABLET BY MOUTH DAILY 90 tablet 3   zinc gluconate 50 MG tablet Take 50 mg by mouth daily.     ALPRAZolam (XANAX) 0.5 MG tablet Take 0.5-1 tablets (0.25-0.5  mg total) by mouth 2 (two) times daily  as needed for anxiety. 30 tablet 0   hydrochlorothiazide (HYDRODIURIL) 25 MG tablet Take 25 mg by mouth. As needed for swelling     No facility-administered medications prior to visit.    Allergies  Allergen Reactions   Shrimp [Shellfish Allergy] Hives    Shrimp only   Aspirin Nausea Only and Other (See Comments)    Tightness of chest   Benazepril Swelling    Entire face, lips, throat   Other    Penicillins Hives, Itching and Rash    Abdominal area only   Clonidine Derivatives Other (See Comments)    Dryness of mouth, lowers blood pressure and pulse    Orange Fruit [Citrus] Rash    Fever blisters and blisters on knuckles     ROS Review of Systems  Constitutional:  Negative for chills and fever.  HENT:  Negative for congestion, sinus pressure, sinus pain and sore throat.   Eyes:  Negative for pain and discharge.  Respiratory:  Negative for cough and shortness of breath.   Cardiovascular:  Negative for chest pain and palpitations.  Gastrointestinal:  Negative for abdominal pain, constipation, diarrhea, nausea and vomiting.  Endocrine: Negative for polydipsia and polyuria.  Genitourinary:  Positive for urgency. Negative for dysuria and hematuria.  Musculoskeletal:  Negative for neck pain and neck stiffness.  Skin:  Negative for rash.  Neurological:  Negative for syncope and weakness.  Psychiatric/Behavioral:  Negative for agitation and behavioral problems.       Objective:    Physical Exam Vitals reviewed.  Constitutional:      General: She is not in acute distress.    Appearance: She is not diaphoretic.  HENT:     Head: Normocephalic and atraumatic.     Nose: Nose normal. No congestion.     Mouth/Throat:     Mouth: Mucous membranes are moist.     Pharynx: No posterior oropharyngeal erythema.  Eyes:     General: No scleral icterus.    Extraocular Movements: Extraocular movements intact.  Cardiovascular:     Rate and Rhythm: Normal rate and regular rhythm.      Pulses: Normal pulses.     Heart sounds: Normal heart sounds. No murmur heard. Pulmonary:     Breath sounds: Normal breath sounds. No wheezing or rales.  Musculoskeletal:     Cervical back: Neck supple. No tenderness.     Right lower leg: No edema.     Left lower leg: No edema.  Skin:    General: Skin is warm.     Findings: No rash.  Neurological:     General: No focal deficit present.     Mental Status: She is alert and oriented to person, place, and time.     Sensory: No sensory deficit.     Motor: No weakness.  Psychiatric:        Mood and Affect: Mood normal.        Behavior: Behavior normal.     BP 136/70 (BP Location: Left Arm)   Pulse 65   Ht 5' 4.5" (1.638 m)   Wt 144 lb 9.6 oz (65.6 kg)   SpO2 97%   BMI 24.44 kg/m  Wt Readings from Last 3 Encounters:  06/11/23 144 lb 9.6 oz (65.6 kg)  05/14/23 146 lb (66.2 kg)  12/04/22 148 lb 6.4 oz (67.3 kg)    Lab Results  Component Value Date   TSH 3.540 05/14/2023   Lab Results  Component Value Date   WBC 6.0 07/24/2022   HGB 12.5 07/24/2022   HCT 38.1 07/24/2022   MCV 87 07/24/2022   PLT 184 07/24/2022   Lab Results  Component Value Date   NA 145 (H) 12/04/2022   K 4.1 12/04/2022   CO2 22 12/04/2022   GLUCOSE 91 12/04/2022   BUN 18 12/04/2022   CREATININE 0.72 12/04/2022   BILITOT 0.5 12/04/2022   ALKPHOS 65 12/04/2022   AST 20 12/04/2022   ALT 14 12/04/2022   PROT 7.0 12/04/2022   ALBUMIN 4.3 12/04/2022   CALCIUM 8.8 12/04/2022   ANIONGAP 7 05/08/2019   EGFR 82 12/04/2022   Lab Results  Component Value Date   CHOL 163 06/11/2023   Lab Results  Component Value Date   HDL 52 06/11/2023   Lab Results  Component Value Date   LDLCALC 91 06/11/2023   Lab Results  Component Value Date   TRIG 112 06/11/2023   Lab Results  Component Value Date   CHOLHDL 3.1 06/11/2023   Lab Results  Component Value Date   HGBA1C 7.1 (H) 05/14/2023      Assessment & Plan:   Problem List Items Addressed  This Visit       Cardiovascular and Mediastinum   Hypertension    BP Readings from Last 1 Encounters:  06/11/23 136/70   Well controlled with felodipine 5 mg daily and losartan 50 mg BID regularly Takes HCTZ PRN for leg swelling, but has not taken it recently Counseled for compliance with the medications Advised DASH diet and moderate exercise/walking as tolerated      Relevant Medications   hydrochlorothiazide (HYDRODIURIL) 25 MG tablet   Other Relevant Orders   CMP14+EGFR   CBC     Endocrine   Postsurgical hypothyroidism    Lab Results  Component Value Date   TSH 3.540 05/14/2023   Takes brand Synthroid as she did not tolerate generic Levothyroxine in the past Checked TSH and free T4 - wnl now      Type 2 diabetes mellitus with other specified complication (HCC) - Primary    Lab Results  Component Value Date   HGBA1C 7.1 (H) 05/14/2023   New onset Prefers to manage with diet Advised to follow diabetic diet On statin and ARB F/u CMP and lipid panel Diabetic foot exam: Today Diabetic eye exam: Advised to follow up with Ophthalmology for diabetic eye exam       Relevant Orders   CMP14+EGFR   Hemoglobin A1c   Urine Microalbumin w/creat. ratio     Nervous and Auditory   Meningioma (HCC)    Last MRI brain reviewed, stable sized meningioma She is asymptomatic        Other   Hyperlipidemia    On Crestor      Relevant Medications   hydrochlorothiazide (HYDRODIURIL) 25 MG tablet   Other Relevant Orders   Lipid Profile (Completed)   Situational anxiety    Takes Xanax 0.25 mg PRN, rarely needs it - refilled      Relevant Medications   ALPRAZolam (XANAX) 0.5 MG tablet    Meds ordered this encounter  Medications   ALPRAZolam (XANAX) 0.5 MG tablet    Sig: Take 0.5-1 tablets (0.25-0.5 mg total) by mouth 2 (two) times daily as needed for anxiety.    Dispense:  30 tablet    Refill:  0    Follow-up: Return in about 4 months (around 10/11/2023) for HTN  and DM.    Angelica Grimes  Concha Se, MD

## 2023-06-11 NOTE — Assessment & Plan Note (Signed)
Lab Results  Component Value Date   HGBA1C 7.1 (H) 05/14/2023   New onset Prefers to manage with diet Advised to follow diabetic diet On statin and ARB F/u CMP and lipid panel Diabetic foot exam: Today Diabetic eye exam: Advised to follow up with Ophthalmology for diabetic eye exam

## 2023-06-12 LAB — LIPID PANEL
Chol/HDL Ratio: 3.1 ratio (ref 0.0–4.4)
Cholesterol, Total: 163 mg/dL (ref 100–199)
HDL: 52 mg/dL
LDL Chol Calc (NIH): 91 mg/dL (ref 0–99)
Triglycerides: 112 mg/dL (ref 0–149)
VLDL Cholesterol Cal: 20 mg/dL (ref 5–40)

## 2023-06-15 NOTE — Assessment & Plan Note (Signed)
Lab Results  Component Value Date   TSH 3.540 05/14/2023   Takes brand Synthroid as she did not tolerate generic Levothyroxine in the past Checked TSH and free T4 - wnl now

## 2023-06-15 NOTE — Assessment & Plan Note (Signed)
On Crestor

## 2023-06-15 NOTE — Assessment & Plan Note (Signed)
Takes Xanax 0.25 mg PRN, rarely needs it - refilled

## 2023-06-15 NOTE — Assessment & Plan Note (Signed)
Last MRI brain reviewed, stable sized meningioma She is asymptomatic

## 2023-06-19 ENCOUNTER — Other Ambulatory Visit (HOSPITAL_COMMUNITY): Payer: Self-pay | Admitting: Internal Medicine

## 2023-06-19 DIAGNOSIS — Z1231 Encounter for screening mammogram for malignant neoplasm of breast: Secondary | ICD-10-CM

## 2023-07-24 ENCOUNTER — Encounter: Payer: Self-pay | Admitting: Internal Medicine

## 2023-07-30 ENCOUNTER — Encounter (HOSPITAL_COMMUNITY): Payer: Self-pay

## 2023-07-30 ENCOUNTER — Ambulatory Visit (HOSPITAL_COMMUNITY)
Admission: RE | Admit: 2023-07-30 | Discharge: 2023-07-30 | Disposition: A | Discharge status: Home / Self Care | Payer: Medicare Other | Source: Ambulatory Visit | Attending: Internal Medicine | Admitting: Internal Medicine

## 2023-07-30 DIAGNOSIS — Z1231 Encounter for screening mammogram for malignant neoplasm of breast: Secondary | ICD-10-CM | POA: Diagnosis present

## 2023-08-12 ENCOUNTER — Other Ambulatory Visit: Payer: Self-pay | Admitting: Internal Medicine

## 2023-08-12 DIAGNOSIS — E785 Hyperlipidemia, unspecified: Secondary | ICD-10-CM

## 2023-08-12 DIAGNOSIS — I10 Essential (primary) hypertension: Secondary | ICD-10-CM

## 2023-08-18 LAB — CMP14+EGFR
ALT: 18 [IU]/L (ref 0–32)
AST: 23 [IU]/L (ref 0–40)
Albumin: 4.4 g/dL (ref 3.7–4.7)
Alkaline Phosphatase: 74 [IU]/L (ref 44–121)
BUN/Creatinine Ratio: 20 (ref 12–28)
BUN: 16 mg/dL (ref 8–27)
Bilirubin Total: 0.5 mg/dL (ref 0.0–1.2)
CO2: 21 mmol/L (ref 20–29)
Calcium: 9.1 mg/dL (ref 8.7–10.3)
Chloride: 105 mmol/L (ref 96–106)
Creatinine, Ser: 0.8 mg/dL (ref 0.57–1.00)
Globulin, Total: 2.6 g/dL (ref 1.5–4.5)
Glucose: 92 mg/dL (ref 70–99)
Potassium: 4.2 mmol/L (ref 3.5–5.2)
Sodium: 145 mmol/L — ABNORMAL HIGH (ref 134–144)
Total Protein: 7 g/dL (ref 6.0–8.5)
eGFR: 72 mL/min/{1.73_m2} (ref >59)

## 2023-08-18 LAB — HEMOGLOBIN A1C
Est. average glucose Bld gHb Est-mCnc: 134 mg/dL
Hgb A1c MFr Bld: 6.3 % — ABNORMAL HIGH (ref 4.8–5.6)

## 2023-08-18 LAB — CBC
Hematocrit: 38.6 % (ref 34.0–46.6)
Hemoglobin: 12.3 g/dL (ref 11.1–15.9)
MCH: 28.5 pg (ref 26.6–33.0)
MCHC: 31.9 g/dL (ref 31.5–35.7)
MCV: 90 fL (ref 79–97)
Platelets: 173 10*3/uL (ref 150–450)
RBC: 4.31 x10E6/uL (ref 3.77–5.28)
RDW: 12.7 % (ref 11.7–15.4)
WBC: 6.5 10*3/uL (ref 3.4–10.8)

## 2023-08-18 LAB — MICROALBUMIN / CREATININE URINE RATIO
Creatinine, Urine: 107.7 mg/dL
Microalb/Creat Ratio: 13 mg/g{creat} (ref 0–29)
Microalbumin, Urine: 13.6 ug/mL

## 2023-10-10 ENCOUNTER — Other Ambulatory Visit: Payer: Self-pay | Admitting: Internal Medicine

## 2023-10-10 DIAGNOSIS — F418 Other specified anxiety disorders: Secondary | ICD-10-CM

## 2023-11-30 ENCOUNTER — Other Ambulatory Visit: Payer: Self-pay | Admitting: Internal Medicine

## 2023-11-30 DIAGNOSIS — F418 Other specified anxiety disorders: Secondary | ICD-10-CM

## 2024-01-06 ENCOUNTER — Encounter: Payer: Self-pay | Admitting: Internal Medicine

## 2024-01-08 ENCOUNTER — Encounter: Payer: Self-pay | Admitting: Internal Medicine

## 2024-01-08 ENCOUNTER — Ambulatory Visit: Payer: Medicare Other | Admitting: Internal Medicine

## 2024-01-08 ENCOUNTER — Other Ambulatory Visit: Payer: Self-pay

## 2024-01-08 VITALS — BP 152/74 | HR 83 | Ht 64.5 in | Wt 145.0 lb

## 2024-01-08 DIAGNOSIS — E1169 Type 2 diabetes mellitus with other specified complication: Secondary | ICD-10-CM | POA: Diagnosis not present

## 2024-01-08 DIAGNOSIS — D329 Benign neoplasm of meninges, unspecified: Secondary | ICD-10-CM

## 2024-01-08 DIAGNOSIS — E0789 Other specified disorders of thyroid: Secondary | ICD-10-CM

## 2024-01-08 DIAGNOSIS — E1159 Type 2 diabetes mellitus with other circulatory complications: Secondary | ICD-10-CM | POA: Diagnosis not present

## 2024-01-08 DIAGNOSIS — R7989 Other specified abnormal findings of blood chemistry: Secondary | ICD-10-CM

## 2024-01-08 DIAGNOSIS — E890 Postprocedural hypothyroidism: Secondary | ICD-10-CM

## 2024-01-08 DIAGNOSIS — E782 Mixed hyperlipidemia: Secondary | ICD-10-CM

## 2024-01-08 DIAGNOSIS — I10 Essential (primary) hypertension: Secondary | ICD-10-CM | POA: Diagnosis not present

## 2024-01-08 DIAGNOSIS — R42 Dizziness and giddiness: Secondary | ICD-10-CM

## 2024-01-08 DIAGNOSIS — E559 Vitamin D deficiency, unspecified: Secondary | ICD-10-CM

## 2024-01-08 DIAGNOSIS — F418 Other specified anxiety disorders: Secondary | ICD-10-CM

## 2024-01-08 MED ORDER — MECLIZINE HCL 25 MG PO TABS: 25.0000 mg | ORAL_TABLET | Freq: Two times a day (BID) | ORAL | 0 refills | Status: DC | PRN

## 2024-01-08 MED ORDER — ALPRAZOLAM 0.5 MG PO TABS: ORAL_TABLET | ORAL | 0 refills | Status: DC

## 2024-01-08 MED ORDER — FELODIPINE ER 10 MG PO TB24: 10.0000 mg | ORAL_TABLET | Freq: Every day | ORAL | 3 refills | Status: DC

## 2024-01-08 NOTE — Assessment & Plan Note (Signed)
Takes Xanax 0.25 mg PRN, rarely needs it - refilled

## 2024-01-08 NOTE — Assessment & Plan Note (Addendum)
 BP Readings from Last 1 Encounters:  01/08/24 (!) 152/74   Uncontrolled with felodipine 5 mg daily and losartan 50 mg BID regularly Increased dose of felodipine to 10 mg QD Takes HCTZ PRN for leg swelling, but has not taken it recently Counseled for compliance with the medications Advised DASH diet and moderate exercise/walking as tolerated

## 2024-01-08 NOTE — Assessment & Plan Note (Signed)
 Intermittent dizziness likely due to BP fluctuation with position change or vertigo Advised to maintain adequate hydration Meclizine as needed for persistent dizziness Simple vestibular exercises material shown

## 2024-01-08 NOTE — Assessment & Plan Note (Addendum)
 Lab Results  Component Value Date   HGBA1C 6.3 (H) 08/16/2023   Associated with HTN and HLD Well controlled with diet modification Prefers to manage with diet Advised to follow diabetic diet On statin and ARB F/u CMP and lipid panel Diabetic eye exam: Advised to follow up with Ophthalmology for diabetic eye exam

## 2024-01-08 NOTE — Assessment & Plan Note (Addendum)
 Last MRI brain reviewed, stable sized meningioma She is asymptomatic overall - dizziness unlikely related to it

## 2024-01-08 NOTE — Patient Instructions (Addendum)
 Please start taking Felodipine 10 mg once daily instead of 5 mg.  Please continue to take medications as prescribed.  Please continue to follow low carb diet and perform moderate exercise/walking as tolerated.  Please take Meclizine only as needed for dizziness/vertigo.  Please perform simple vestibular exercises as shown in the video:  Inner Ear Balance Home Exercises to Treat Dizziness (Vestibular Home Exercises)

## 2024-01-08 NOTE — Progress Notes (Signed)
 Established Patient Office Visit  Subjective:  Patient ID: Angelica Grimes, female    DOB: 31-Dec-1934  Age: 89 y.o. MRN: [id number]  CC:  Chief Complaint  Patient presents with   Care Management    4 month f/u    HPI Angelica Grimes is a 89 y.o. female with past medical history of HTN, hypothyroidism, DCIS of right breast and urge incontinence who presents for f/u of her chronic medical conditions.  HTN: BP is elevated today. Takes felodipine 5 mg QD and losartan 50 mg BID regularly. Patient denies headache, chest pain, dyspnea or palpitations. She has had intermittent lightheadedness at times when changing the position.  Hypothyroidism: Her last TSH and free T4 are wnl now.  She is currently taking Synthroid 75 mcg QD.  She denies any recent change in weight or appetite.  Denies any tremors or palpitations.  Type II DM: Her HbA1c is 6.3 now.  She reports that she has been watching over her diet recently.  She agrees to follow low-carb diet.  Denies polyuria or polyphagia currently.  She keeps Xanax for situational anxiety, rarely takes it.  She requests refill of Xanax.  Past Medical History:  Diagnosis Date   Anxiety    Brain tumor (benign) (HCC)    Cancer (HCC)    right breast   Cancer (HCC)    thyroid   Fracture ankle fracture 01/24/16   GERD (gastroesophageal reflux disease)    Hyperlipidemia    Hypertension    Hypothyroidism    IBS (irritable bowel syndrome)    Low iron stores 11/08/2016   Positive colorectal cancer screening using Cologuard test 05/01/2018    Past Surgical History:  Procedure Laterality Date   breast cyst surgery      x 2 - benign    BREAST LUMPECTOMY     right breast with re-excision   BREAST SURGERY  1966, 1991, 1998   biopsy   BUNIONECTOMY  2012   right foot   COLONOSCOPY     CYSTECTOMY  1983   THYROIDECTOMY  1991   due to cancer   TONSILLECTOMY      Family History  Problem Relation Age of Onset   Stroke Mother    Pneumonia  Mother    Cancer Father        bladder, prostate   Pneumonia Father    Cancer Sister        kidney   Cancer Brother        lung   Diabetes Sister    Sarcoidosis Sister    Cancer Half-Sister        kidney cancer   Cancer Half-Sister        lung cancer   Atrial fibrillation Brother    Atrial fibrillation Brother    Atrial fibrillation Brother    Colon cancer Neg Hx     Social History   Socioeconomic History   Marital status: Widowed    Spouse name: Not on file   Number of children: 2   Years of education: 63   Highest education level: Not on file  Occupational History   Not on file  Tobacco Use   Smoking status: Never   Smokeless tobacco: Never  Vaping Use   Vaping status: Never Used  Substance and Sexual Activity   Alcohol use: Yes    Comment: rare   Drug use: No   Sexual activity: Not Currently  Other Topics Concern   Not on file  Social  History Narrative   Lives with grandson      Widow   2 children   1: [place] Daughter-Letitia (has permission to get info) 2 children: 1 son and daughter (son lives with her)   2: [place] Son-[name] 1 son      No pets      Diet: eats what she wants   Caffeine: none,  Maybe a coke at times   Water: 3-4 bottles of 16 oz      Wear seat belt   Wear sun protection if needed   Smoke detectors and Security system    Does not use phone in car         Social Drivers of Health   Financial Resource Strain: Low Risk  (05/14/2023)   Overall Financial Resource Strain (CARDIA)    Difficulty of Paying Living Expenses: Not hard at all  Food Insecurity: No Food Insecurity (05/14/2023)   Hunger Vital Sign    Worried About Running Out of Food in the Last Year: Never true    Ran Out of Food in the Last Year: Never true  Transportation Needs: No Transportation Needs (05/14/2023)   PRAPARE - Administrator, Civil Service (Medical): No    Lack of Transportation (Non-Medical): No  Physical Activity: Sufficiently Active (05/14/2023)    Exercise Vital Sign    Days of Exercise per Week: 5 days    Minutes of Exercise per Session: 30 min  Stress: Stress Concern Present (05/14/2023)   [employer] of Occupational Health - Occupational Stress Questionnaire    Feeling of Stress : To some extent  Social Connections: Moderately Integrated (05/14/2023)   Social Connection and Isolation Panel [NHANES]    Frequency of Communication with Friends and Family: More than three times a week    Frequency of Social Gatherings with Friends and Family: Once a week    Attends Religious Services: 1 to 4 times per year    Active Member of [employer] or Organizations: No    Attends Banker Meetings: 1 to 4 times per year    Marital Status: Widowed  Intimate Partner Violence: Not At Risk (05/14/2023)   Humiliation, Afraid, Rape, and Kick questionnaire    Fear of Current or Ex-Partner: No    Emotionally Abused: No    Physically Abused: No    Sexually Abused: No    Outpatient Medications Prior to Visit  Medication Sig Dispense Refill   co-enzyme Q-10 50 MG capsule Take 100 mg by mouth daily.     Cyanocobalamin (VITAMIN B 12 PO) Take 1,000 mcg by mouth.     Glucosamine-Chondroitin (OSTEO BI-FLEX REGULAR STRENGTH PO) Take 2 tablets by mouth daily.     hydrochlorothiazide (HYDRODIURIL) 25 MG tablet Take 25 mg by mouth daily as needed (Leg swelling).     losartan (COZAAR) 50 MG tablet TAKE 1 TABLET BY MOUTH TWICE DAILY 180 tablet 1   omeprazole (PRILOSEC) 20 MG capsule TAKE ONE CAPSULE BY MOUTH DAILY 90 capsule 3   rosuvastatin (CRESTOR) 5 MG tablet TAKE 1 TABLET BY MOUTH DAILY 90 tablet 3   SYNTHROID 75 MCG tablet TAKE 1 TABLET BY MOUTH DAILY 90 tablet 3   zinc gluconate 50 MG tablet Take 50 mg by mouth daily.     ALPRAZolam (XANAX) 0.5 MG tablet TAKE ONE-HALF TO 1 TABLET BY MOUTH TWICE DAILY AS NEEDED FOR ANXIETY 30 tablet 0   felodipine (PLENDIL) 5 MG 24 hr tablet TAKE 1 TABLET BY MOUTH EVERY  DAY 90 tablet 3   No  facility-administered medications prior to visit.    Allergies  Allergen Reactions   Shrimp [Shellfish Allergy] Hives    Shrimp only   Aspirin Nausea Only and Other (See Comments)    Tightness of chest   Benazepril Swelling    Entire face, lips, throat   Other    Penicillins Hives, Itching and Rash    Abdominal area only   Clonidine Derivatives Other (See Comments)    Dryness of mouth, lowers blood pressure and pulse    Orange Fruit [Citrus] Rash    Fever blisters and blisters on knuckles     ROS Review of Systems  Constitutional:  Negative for chills and fever.  HENT:  Negative for congestion, sinus pressure, sinus pain and sore throat.   Eyes:  Negative for pain and discharge.  Respiratory:  Negative for cough and shortness of breath.   Cardiovascular:  Negative for chest pain and palpitations.  Gastrointestinal:  Negative for abdominal pain, constipation, diarrhea, nausea and vomiting.  Endocrine: Negative for polydipsia and polyuria.  Genitourinary:  Positive for urgency. Negative for dysuria and hematuria.  Musculoskeletal:  Negative for neck pain and neck stiffness.  Skin:  Negative for rash.  Neurological:  Positive for dizziness. Negative for syncope and weakness.  Psychiatric/Behavioral:  Negative for agitation and behavioral problems.       Objective:    Physical Exam Vitals reviewed.  Constitutional:      General: She is not in acute distress.    Appearance: She is not diaphoretic.  HENT:     Head: Normocephalic and atraumatic.     Nose: Nose normal. No congestion.     Mouth/Throat:     Mouth: Mucous membranes are moist.     Pharynx: No posterior oropharyngeal erythema.  Eyes:     General: No scleral icterus.    Extraocular Movements: Extraocular movements intact.  Cardiovascular:     Rate and Rhythm: Normal rate and regular rhythm.     Pulses: Normal pulses.     Heart sounds: Normal heart sounds. No murmur heard. Pulmonary:     Breath sounds:  Normal breath sounds. No wheezing or rales.  Musculoskeletal:     Cervical back: Neck supple. No tenderness.     Right lower leg: No edema.     Left lower leg: No edema.  Skin:    General: Skin is warm.     Findings: No rash.  Neurological:     General: No focal deficit present.     Mental Status: She is alert and oriented to person, place, and time.     Sensory: No sensory deficit.     Motor: No weakness.  Psychiatric:        Mood and Affect: Mood normal.        Behavior: Behavior normal.     BP (!) 152/74 (BP Location: Left Arm)   Pulse 83   Ht 5' 4.5" (1.638 m)   Wt 145 lb (65.8 kg)   SpO2 97%   BMI 24.50 kg/m  Wt Readings from Last 3 Encounters:  01/08/24 145 lb (65.8 kg)  06/11/23 144 lb 9.6 oz (65.6 kg)  05/14/23 146 lb (66.2 kg)    Lab Results  Component Value Date   TSH 3.540 05/14/2023   Lab Results  Component Value Date   WBC 6.5 08/16/2023   HGB 12.3 08/16/2023   HCT 38.6 08/16/2023   MCV 90 08/16/2023   PLT 173 08/16/2023  Lab Results  Component Value Date   NA 145 (H) 08/16/2023   K 4.2 08/16/2023   CO2 21 08/16/2023   GLUCOSE 92 08/16/2023   BUN 16 08/16/2023   CREATININE 0.80 08/16/2023   BILITOT 0.5 08/16/2023   ALKPHOS 74 08/16/2023   AST 23 08/16/2023   ALT 18 08/16/2023   PROT 7.0 08/16/2023   ALBUMIN 4.4 08/16/2023   CALCIUM 9.1 08/16/2023   ANIONGAP 7 05/08/2019   EGFR 72 08/16/2023   Lab Results  Component Value Date   CHOL 163 06/11/2023   Lab Results  Component Value Date   HDL 52 06/11/2023   Lab Results  Component Value Date   LDLCALC 91 06/11/2023   Lab Results  Component Value Date   TRIG 112 06/11/2023   Lab Results  Component Value Date   CHOLHDL 3.1 06/11/2023   Lab Results  Component Value Date   HGBA1C 6.3 (H) 08/16/2023      Assessment & Plan:   Problem List Items Addressed This Visit       Cardiovascular and Mediastinum   Hypertension - Primary   BP Readings from Last 1 Encounters:   01/08/24 (!) 152/74   Uncontrolled with felodipine 5 mg daily and losartan 50 mg BID regularly Increased dose of felodipine to 10 mg QD Takes HCTZ PRN for leg swelling, but has not taken it recently Counseled for compliance with the medications Advised DASH diet and moderate exercise/walking as tolerated      Relevant Medications   felodipine (PLENDIL) 10 MG 24 hr tablet     Endocrine   Postsurgical hypothyroidism   Lab Results  Component Value Date   TSH 3.540 05/14/2023   Takes brand Synthroid as she did not tolerate generic Levothyroxine in the past Checked TSH and free T4 - wnl now      Type 2 diabetes mellitus with other specified complication (HCC)   Lab Results  Component Value Date   HGBA1C 6.3 (H) 08/16/2023   Associated with HTN and HLD Well controlled with diet modification Prefers to manage with diet Advised to follow diabetic diet On statin and ARB F/u CMP and lipid panel Diabetic eye exam: Advised to follow up with Ophthalmology for diabetic eye exam         Nervous and Auditory   Meningioma (HCC)   Last MRI brain reviewed, stable sized meningioma She is asymptomatic overall - dizziness unlikely related to it        Other   Situational anxiety   Takes Xanax 0.25 mg PRN, rarely needs it - refilled      Relevant Medications   ALPRAZolam (XANAX) 0.5 MG tablet   Dizziness   Intermittent dizziness likely due to BP fluctuation with position change or vertigo Advised to maintain adequate hydration Meclizine as needed for persistent dizziness Simple vestibular exercises material shown      Relevant Medications   meclizine (ANTIVERT) 25 MG tablet    Meds ordered this encounter  Medications   felodipine (PLENDIL) 10 MG 24 hr tablet    Sig: Take 1 tablet (10 mg total) by mouth daily.    Dispense:  90 tablet    Refill:  3    Dose change - 01/08/24   meclizine (ANTIVERT) 25 MG tablet    Sig: Take 1 tablet (25 mg total) by mouth 2 (two) times  daily as needed for dizziness.    Dispense:  30 tablet    Refill:  0   ALPRAZolam Prudy Feeler)  0.5 MG tablet    Sig: TAKE ONE-HALF TO 1 TABLET BY MOUTH TWICE DAILY AS NEEDED FOR ANXIETY    Dispense:  30 tablet    Refill:  0    Follow-up: Return in about 4 months (around 05/09/2024) for HTN and DM.    Anabel Halon, MD

## 2024-01-08 NOTE — Assessment & Plan Note (Signed)
Lab Results  Component Value Date   TSH 3.540 05/14/2023   Takes brand Synthroid as she did not tolerate generic Levothyroxine in the past Checked TSH and free T4 - wnl now

## 2024-01-09 LAB — CMP14+EGFR
ALT: 14 IU/L (ref 0–32)
AST: 19 IU/L (ref 0–40)
Albumin: 4.3 g/dL (ref 3.7–4.7)
Alkaline Phosphatase: 67 IU/L (ref 44–121)
BUN/Creatinine Ratio: 20 (ref 12–28)
BUN: 16 mg/dL (ref 8–27)
Bilirubin Total: 0.4 mg/dL (ref 0.0–1.2)
CO2: 24 mmol/L (ref 20–29)
Calcium: 9.1 mg/dL (ref 8.7–10.3)
Chloride: 104 mmol/L (ref 96–106)
Creatinine, Ser: 0.81 mg/dL (ref 0.57–1.00)
Globulin, Total: 2.2 g/dL (ref 1.5–4.5)
Glucose: 95 mg/dL (ref 70–99)
Potassium: 4.1 mmol/L (ref 3.5–5.2)
Sodium: 142 mmol/L (ref 134–144)
Total Protein: 6.5 g/dL (ref 6.0–8.5)
eGFR: 71 mL/min/{1.73_m2} (ref >59)

## 2024-01-09 LAB — CBC WITH DIFFERENTIAL/PLATELET
Basophils Absolute: 0 x10E3/uL (ref 0.0–0.2)
Basos: 1 %
EOS (ABSOLUTE): 0.2 x10E3/uL (ref 0.0–0.4)
Eos: 4 %
Hematocrit: 35.2 % (ref 34.0–46.6)
Hemoglobin: 11.5 g/dL (ref 11.1–15.9)
Immature Grans (Abs): 0 x10E3/uL (ref 0.0–0.1)
Immature Granulocytes: 0 %
Lymphocytes Absolute: 1.9 x10E3/uL (ref 0.7–3.1)
Lymphs: 40 %
MCH: 28.4 pg (ref 26.6–33.0)
MCHC: 32.7 g/dL (ref 31.5–35.7)
MCV: 87 fL (ref 79–97)
Monocytes Absolute: 0.4 x10E3/uL (ref 0.1–0.9)
Monocytes: 9 %
Neutrophils Absolute: 2.1 x10E3/uL (ref 1.4–7.0)
Neutrophils: 46 %
Platelets: 165 x10E3/uL (ref 150–450)
RBC: 4.05 x10E6/uL (ref 3.77–5.28)
RDW: 13.5 % (ref 11.7–15.4)
WBC: 4.6 x10E3/uL (ref 3.4–10.8)

## 2024-01-09 LAB — LIPID PANEL
Chol/HDL Ratio: 2.8 ratio (ref 0.0–4.4)
Cholesterol, Total: 156 mg/dL (ref 100–199)
HDL: 56 mg/dL (ref >39)
LDL Chol Calc (NIH): 82 mg/dL (ref 0–99)
Triglycerides: 98 mg/dL (ref 0–149)
VLDL Cholesterol Cal: 18 mg/dL (ref 5–40)

## 2024-01-09 LAB — HEMOGLOBIN A1C
Est. average glucose Bld gHb Est-mCnc: 131 mg/dL
Hgb A1c MFr Bld: 6.2 % — ABNORMAL HIGH (ref 4.8–5.6)

## 2024-01-09 LAB — TSH: TSH: 3.13 u[IU]/mL (ref 0.450–4.500)

## 2024-01-09 LAB — VITAMIN D 25 HYDROXY (VIT D DEFICIENCY, FRACTURES): Vit D, 25-Hydroxy: 41.3 ng/mL (ref 30.0–100.0)

## 2024-02-08 ENCOUNTER — Other Ambulatory Visit: Payer: Self-pay | Admitting: Internal Medicine

## 2024-02-08 DIAGNOSIS — I10 Essential (primary) hypertension: Secondary | ICD-10-CM

## 2024-05-08 ENCOUNTER — Other Ambulatory Visit: Payer: Self-pay | Admitting: Internal Medicine

## 2024-05-12 ENCOUNTER — Other Ambulatory Visit (HOSPITAL_COMMUNITY): Payer: Self-pay | Admitting: Internal Medicine

## 2024-05-12 DIAGNOSIS — Z1231 Encounter for screening mammogram for malignant neoplasm of breast: Secondary | ICD-10-CM

## 2024-05-13 ENCOUNTER — Encounter: Payer: Self-pay | Admitting: Internal Medicine

## 2024-05-13 ENCOUNTER — Ambulatory Visit (INDEPENDENT_AMBULATORY_CARE_PROVIDER_SITE_OTHER): Admitting: Internal Medicine

## 2024-05-13 VITALS — BP 138/72 | HR 68 | Ht 64.5 in | Wt 142.0 lb

## 2024-05-13 DIAGNOSIS — I10 Essential (primary) hypertension: Secondary | ICD-10-CM

## 2024-05-13 DIAGNOSIS — F418 Other specified anxiety disorders: Secondary | ICD-10-CM

## 2024-05-13 DIAGNOSIS — D329 Benign neoplasm of meninges, unspecified: Secondary | ICD-10-CM

## 2024-05-13 DIAGNOSIS — E1169 Type 2 diabetes mellitus with other specified complication: Secondary | ICD-10-CM

## 2024-05-13 DIAGNOSIS — E782 Mixed hyperlipidemia: Secondary | ICD-10-CM

## 2024-05-13 DIAGNOSIS — D1721 Benign lipomatous neoplasm of skin and subcutaneous tissue of right arm: Secondary | ICD-10-CM | POA: Insufficient documentation

## 2024-05-13 DIAGNOSIS — E890 Postprocedural hypothyroidism: Secondary | ICD-10-CM

## 2024-05-13 MED ORDER — HYDROCHLOROTHIAZIDE 25 MG PO TABS: 25.0000 mg | ORAL_TABLET | Freq: Every day | ORAL | 2 refills | Status: AC | PRN

## 2024-05-13 MED ORDER — ALPRAZOLAM 0.5 MG PO TABS: ORAL_TABLET | ORAL | 0 refills | Status: DC

## 2024-05-13 NOTE — Assessment & Plan Note (Signed)
 Right forearm mass likely lipoma, reassured it being benign Advised to contact if any change in size or shape

## 2024-05-13 NOTE — Assessment & Plan Note (Signed)
 Last MRI brain reviewed from 2023, stable sized meningioma - she prefers to wait for now for surveillance MRI of brain She is asymptomatic overall - dizziness unlikely related to it

## 2024-05-13 NOTE — Patient Instructions (Addendum)
 Please continue to take medications as prescribed.  Please continue to follow low salt diet and ambulate as tolerated.  Please try Melatonin 5 mg once at bedtime as needed for insomnia.

## 2024-05-13 NOTE — Progress Notes (Signed)
 Established Patient Office Visit  Subjective:  Patient ID: Angelica Grimes, female    DOB: 1935-05-06  Age: 89 y.o. MRN: [id number]  CC:  Chief Complaint  Patient presents with   Hypertension    4 month f/u    Diabetes    4 month f/u    Cyst    Has concerns about knots on her right arm.     HPI Angelica Grimes is a 89 y.o. female with past medical history of HTN, hypothyroidism, DCIS of right breast and urge incontinence who presents for f/u of her chronic medical conditions.  HTN: BP is wnl today. Takes felodipine  10 mg QD and losartan  50 mg BID regularly. Patient denies headache, chest pain, dyspnea or palpitations. She has had intermittent lightheadedness at times when changing the position.  Hypothyroidism: Her last TSH and free T4 are wnl now.  She is currently taking Synthroid  75 mcg QD.  She denies any recent change in weight or appetite.  Denies any tremors or palpitations.  Type II DM: Her HbA1c was 6.2 in 04/25.  She reports that she has been watching over her diet recently.  She agrees to follow low-carb diet.  Denies polyuria or polyphagia currently.  She keeps Xanax  for situational anxiety, rarely takes it.  She requests refill of Xanax .  She reports noticing a knot on her right forearm for the last 3 months.  She denies any local pain currently.  Past Medical History:  Diagnosis Date   Anxiety    Brain tumor (benign) (HCC)    Cancer (HCC)    right breast   Cancer (HCC)    thyroid    Fracture ankle fracture 01/24/16   GERD (gastroesophageal reflux disease)    Hyperlipidemia    Hypertension    Hypothyroidism    IBS (irritable bowel syndrome)    Low iron  stores 11/08/2016   Positive colorectal cancer screening using Cologuard test 05/01/2018    Past Surgical History:  Procedure Laterality Date   breast cyst surgery      x 2 - benign    BREAST LUMPECTOMY     right breast with re-excision   BREAST SURGERY  1966, 1991, 1998   biopsy   BUNIONECTOMY  2012    right foot   COLONOSCOPY     CYSTECTOMY  1983   THYROIDECTOMY  1991   due to cancer   TONSILLECTOMY      Family History  Problem Relation Age of Onset   Stroke Mother    Pneumonia Mother    Cancer Father        bladder, prostate   Pneumonia Father    Cancer Sister        kidney   Cancer Brother        lung   Diabetes Sister    Sarcoidosis Sister    Cancer Half-Sister        kidney cancer   Cancer Half-Sister        lung cancer   Atrial fibrillation Brother    Atrial fibrillation Brother    Atrial fibrillation Brother    Colon cancer Neg Hx     Social History   Socioeconomic History   Marital status: Widowed    Spouse name: Not on file   Number of children: 2   Years of education: 68   Highest education level: Not on file  Occupational History   Not on file  Tobacco Use   Smoking status: Never   Smokeless  tobacco: Never  Vaping Use   Vaping status: Never Used  Substance and Sexual Activity   Alcohol use: Yes    Comment: rare   Drug use: No   Sexual activity: Not Currently  Other Topics Concern   Not on file  Social History Narrative   Lives with grandson      Widow   2 children   1: [name] (has permission to get info) 2 children: 1 son and daughter (son lives with her)   2: [place] Son-[name] 1 son      No pets      Diet: eats what she wants   Caffeine: none,  Maybe a coke at times   Water: 3-4 bottles of 16 oz      Wear seat belt   Wear sun protection if needed   Smoke detectors and Security system    Does not use phone in car         Social Drivers of Health   Financial Resource Strain: Low Risk  (05/14/2023)   Overall Financial Resource Strain (CARDIA)    Difficulty of Paying Living Expenses: Not hard at all  Food Insecurity: No Food Insecurity (05/14/2023)   Hunger Vital Sign    Worried About Running Out of Food in the Last Year: Never true    Ran Out of Food in the Last Year: Never true  Transportation Needs: No  Transportation Needs (05/14/2023)   PRAPARE - Administrator, Civil Service (Medical): No    Lack of Transportation (Non-Medical): No  Physical Activity: Sufficiently Active (05/14/2023)   Exercise Vital Sign    Days of Exercise per Week: 5 days    Minutes of Exercise per Session: 30 min  Stress: Stress Concern Present (05/14/2023)   [employer] of Occupational Health - Occupational Stress Questionnaire    Feeling of Stress : To some extent  Social Connections: Moderately Integrated (05/14/2023)   Social Connection and Isolation Panel    Frequency of Communication with Friends and Family: More than three times a week    Frequency of Social Gatherings with Friends and Family: Once a week    Attends Religious Services: 1 to 4 times per year    Active Member of [employer] or Organizations: No    Attends Banker Meetings: 1 to 4 times per year    Marital Status: Widowed  Intimate Partner Violence: Not At Risk (05/14/2023)   Humiliation, Afraid, Rape, and Kick questionnaire    Fear of Current or Ex-Partner: No    Emotionally Abused: No    Physically Abused: No    Sexually Abused: No    Outpatient Medications Prior to Visit  Medication Sig Dispense Refill   co-enzyme Q-10 50 MG capsule Take 100 mg by mouth daily.     felodipine  (PLENDIL ) 10 MG 24 hr tablet Take 1 tablet (10 mg total) by mouth daily. 90 tablet 3   Glucosamine-Chondroitin (OSTEO BI-FLEX REGULAR STRENGTH PO) Take 2 tablets by mouth daily.     losartan  (COZAAR ) 50 MG tablet TAKE 1 TABLET BY MOUTH TWICE DAILY 180 tablet 1   omeprazole  (PRILOSEC) 20 MG capsule TAKE ONE CAPSULE BY MOUTH DAILY 90 capsule 3   rosuvastatin  (CRESTOR ) 5 MG tablet TAKE 1 TABLET BY MOUTH DAILY 90 tablet 3   SYNTHROID  75 MCG tablet TAKE 1 TABLET BY MOUTH DAILY 90 tablet 3   zinc gluconate 50 MG tablet Take 50 mg by mouth daily.     ALPRAZolam  (  XANAX ) 0.5 MG tablet TAKE ONE-HALF TO 1 TABLET BY MOUTH TWICE DAILY AS NEEDED FOR  ANXIETY 30 tablet 0   Cyanocobalamin (VITAMIN B 12 PO) Take 1,000 mcg by mouth.     hydrochlorothiazide  (HYDRODIURIL ) 25 MG tablet Take 25 mg by mouth daily as needed (Leg swelling).     meclizine  (ANTIVERT ) 25 MG tablet Take 1 tablet (25 mg total) by mouth 2 (two) times daily as needed for dizziness. 30 tablet 0   No facility-administered medications prior to visit.    Allergies  Allergen Reactions   Shrimp [Shellfish Allergy] Hives    Shrimp only   Aspirin Nausea Only and Other (See Comments)    Tightness of chest   Benazepril Swelling    Entire face, lips, throat   Other    Penicillins Hives, Itching and Rash    Abdominal area only   Clonidine Derivatives Other (See Comments)    Dryness of mouth, lowers blood pressure and pulse    Orange Fruit [Citrus] Rash    Fever blisters and blisters on knuckles     ROS Review of Systems  Constitutional:  Negative for chills and fever.  HENT:  Negative for congestion, sinus pressure, sinus pain and sore throat.   Eyes:  Negative for pain and discharge.  Respiratory:  Negative for cough and shortness of breath.   Cardiovascular:  Negative for chest pain and palpitations.  Gastrointestinal:  Negative for abdominal pain, diarrhea, nausea and vomiting.  Endocrine: Negative for polydipsia and polyuria.  Genitourinary:  Positive for urgency. Negative for dysuria and hematuria.  Musculoskeletal:  Negative for neck pain and neck stiffness.  Skin:  Negative for rash.  Neurological:  Positive for dizziness. Negative for syncope and weakness.  Psychiatric/Behavioral:  Negative for agitation and behavioral problems.       Objective:    Physical Exam Vitals reviewed.  Constitutional:      General: She is not in acute distress.    Appearance: She is not diaphoretic.  HENT:     Head: Normocephalic and atraumatic.     Nose: Nose normal. No congestion.     Mouth/Throat:     Mouth: Mucous membranes are moist.     Pharynx: No posterior  oropharyngeal erythema.  Eyes:     General: No scleral icterus.    Extraocular Movements: Extraocular movements intact.  Cardiovascular:     Rate and Rhythm: Normal rate and regular rhythm.     Heart sounds: Normal heart sounds. No murmur heard. Pulmonary:     Breath sounds: Normal breath sounds. No wheezing or rales.  Musculoskeletal:     Cervical back: Neck supple. No tenderness.     Right lower leg: No edema.     Left lower leg: No edema.  Skin:    General: Skin is warm.     Findings: No rash.     Comments: Above 2 cm in diameter soft tissue mass over right forearm on dorsal surface, nontender - likely lipoma  Neurological:     General: No focal deficit present.     Mental Status: She is alert and oriented to person, place, and time.     Sensory: No sensory deficit.     Motor: No weakness.  Psychiatric:        Mood and Affect: Mood normal.        Behavior: Behavior normal.     BP 138/72 (BP Location: Left Arm)   Pulse 68   Ht 5' 4.5 (1.638 m)  Wt 142 lb (64.4 kg)   SpO2 98%   BMI 24.00 kg/m  Wt Readings from Last 3 Encounters:  05/13/24 142 lb (64.4 kg)  01/08/24 145 lb (65.8 kg)  06/11/23 144 lb 9.6 oz (65.6 kg)    Lab Results  Component Value Date   TSH 3.130 01/08/2024   Lab Results  Component Value Date   WBC 4.6 01/08/2024   HGB 11.5 01/08/2024   HCT 35.2 01/08/2024   MCV 87 01/08/2024   PLT 165 01/08/2024   Lab Results  Component Value Date   NA 142 01/08/2024   K 4.1 01/08/2024   CO2 24 01/08/2024   GLUCOSE 95 01/08/2024   BUN 16 01/08/2024   CREATININE 0.81 01/08/2024   BILITOT 0.4 01/08/2024   ALKPHOS 67 01/08/2024   AST 19 01/08/2024   ALT 14 01/08/2024   PROT 6.5 01/08/2024   ALBUMIN 4.3 01/08/2024   CALCIUM  9.1 01/08/2024   ANIONGAP 7 05/08/2019   EGFR 71 01/08/2024   Lab Results  Component Value Date   CHOL 156 01/08/2024   Lab Results  Component Value Date   HDL 56 01/08/2024   Lab Results  Component Value Date    LDLCALC 82 01/08/2024   Lab Results  Component Value Date   TRIG 98 01/08/2024   Lab Results  Component Value Date   CHOLHDL 2.8 01/08/2024   Lab Results  Component Value Date   HGBA1C 6.2 (H) 01/08/2024      Assessment & Plan:   Problem List Items Addressed This Visit       Cardiovascular and Mediastinum   Hypertension - Primary   BP Readings from Last 1 Encounters:  05/13/24 128/68   Well-controlled with felodipine  10 mg daily and losartan  50 mg BID regularly Takes HCTZ PRN for leg swelling, but takes it rarely, refilled as per patient request Counseled for compliance with the medications Advised DASH diet and moderate exercise/walking as tolerated      Relevant Medications   hydrochlorothiazide  (HYDRODIURIL ) 25 MG tablet   Other Relevant Orders   Basic Metabolic Panel (BMET)     Endocrine   Postsurgical hypothyroidism   Lab Results  Component Value Date   TSH 3.130 01/08/2024   Takes brand Synthroid  as she did not tolerate generic Levothyroxine  in the past Checked TSH and free T4 - wnl now      Relevant Orders   TSH + free T4   Type 2 diabetes mellitus with other specified complication (HCC)   Lab Results  Component Value Date   HGBA1C 6.2 (H) 01/08/2024   Associated with HTN and HLD Well controlled with diet modification Prefers to manage with diet Advised to follow diabetic diet On statin and ARB F/u CMP and lipid panel Diabetic eye exam: Advised to follow up with Ophthalmology for diabetic eye exam      Relevant Orders   Hemoglobin A1c     Nervous and Auditory   Meningioma (HCC)   Last MRI brain reviewed from 2023, stable sized meningioma - she prefers to wait for now for surveillance MRI of brain She is asymptomatic overall - dizziness unlikely related to it        Other   Hyperlipidemia   On Crestor  5 mg QD Lipid profile reviewed      Relevant Medications   hydrochlorothiazide  (HYDRODIURIL ) 25 MG tablet   Situational anxiety    Takes Xanax  0.25 mg PRN, rarely needs it - refilled      Relevant  Medications   ALPRAZolam  (XANAX ) 0.5 MG tablet   Lipoma of right upper extremity   Right forearm mass likely lipoma, reassured it being benign Advised to contact if any change in size or shape       Meds ordered this encounter  Medications   hydrochlorothiazide  (HYDRODIURIL ) 25 MG tablet    Sig: Take 1 tablet (25 mg total) by mouth daily as needed (Leg swelling).    Dispense:  30 tablet    Refill:  2   ALPRAZolam  (XANAX ) 0.5 MG tablet    Sig: TAKE ONE-HALF TO 1 TABLET BY MOUTH TWICE DAILY AS NEEDED FOR ANXIETY    Dispense:  30 tablet    Refill:  0    Follow-up: Return in about 6 months (around 11/13/2024) for HTN and hypothyroidism.    Suzzane MARLA Blanch, MD

## 2024-05-13 NOTE — Assessment & Plan Note (Addendum)
 BP Readings from Last 1 Encounters:  05/13/24 128/68   Well-controlled with felodipine  10 mg daily and losartan  50 mg BID regularly Takes HCTZ PRN for leg swelling, but takes it rarely, refilled as per patient request Counseled for compliance with the medications Advised DASH diet and moderate exercise/walking as tolerated

## 2024-05-13 NOTE — Assessment & Plan Note (Signed)
 Lab Results  Component Value Date   TSH 3.130 01/08/2024   Takes brand Synthroid  as she did not tolerate generic Levothyroxine  in the past Checked TSH and free T4 - wnl now

## 2024-05-13 NOTE — Assessment & Plan Note (Signed)
Takes Xanax 0.25 mg PRN, rarely needs it - refilled

## 2024-05-13 NOTE — Assessment & Plan Note (Signed)
 Lab Results  Component Value Date   HGBA1C 6.2 (H) 01/08/2024   Associated with HTN and HLD Well controlled with diet modification Prefers to manage with diet Advised to follow diabetic diet On statin and ARB F/u CMP and lipid panel Diabetic eye exam: Advised to follow up with Ophthalmology for diabetic eye exam

## 2024-05-13 NOTE — Assessment & Plan Note (Signed)
 On Crestor  5 mg QD Lipid profile reviewed

## 2024-05-16 ENCOUNTER — Telehealth: Payer: Self-pay

## 2024-05-16 NOTE — Telephone Encounter (Signed)
 Copied from CRM #[id number]. Topic: Clinical - Lab/Test Results >> May 16, 2024  1:31 PM DeAngela L wrote: Reason for CRM: patient calling about lab results and she is just used to the results not posted in MyChart for her to look at them So she was just checking to see if the provider had a chance to see lab results  especially her A1C is what she really wanted to check  Pt num [phone] (H)

## 2024-05-19 NOTE — Telephone Encounter (Signed)
 Results not in chart yet

## 2024-05-20 NOTE — Telephone Encounter (Signed)
 Best contact: [phone], please return call

## 2024-05-20 NOTE — Telephone Encounter (Signed)
 Lmtrc

## 2024-05-21 NOTE — Telephone Encounter (Signed)
 Spoke to pt, states she had her labs done on 05/13/24, however they are not resulted or showing in her chart, checking with lab tech to see what's going on with labs. Just an FYI

## 2024-05-27 ENCOUNTER — Ambulatory Visit: Payer: Self-pay | Admitting: Internal Medicine

## 2024-05-27 LAB — BASIC METABOLIC PANEL WITH GFR
BUN/Creatinine Ratio: 19 (ref 12–28)
BUN: 16 mg/dL (ref 8–27)
CO2: 21 mmol/L (ref 20–29)
Calcium: 9.1 mg/dL (ref 8.7–10.3)
Chloride: 101 mmol/L (ref 96–106)
Creatinine, Ser: 0.86 mg/dL (ref 0.57–1.00)
Glucose: 92 mg/dL (ref 70–99)
Potassium: 3.9 mmol/L (ref 3.5–5.2)
Sodium: 139 mmol/L (ref 134–144)
eGFR: 66 mL/min/1.73 (ref >59)

## 2024-05-27 LAB — TSH+FREE T4
Free T4: 1.91 ng/dL — ABNORMAL HIGH (ref 0.82–1.77)
TSH: 3.19 u[IU]/mL (ref 0.450–4.500)

## 2024-05-27 LAB — HEMOGLOBIN A1C
Est. average glucose Bld gHb Est-mCnc: 131 mg/dL
Hgb A1c MFr Bld: 6.2 % — ABNORMAL HIGH (ref 4.8–5.6)

## 2024-05-28 ENCOUNTER — Ambulatory Visit

## 2024-06-11 ENCOUNTER — Ambulatory Visit (INDEPENDENT_AMBULATORY_CARE_PROVIDER_SITE_OTHER)

## 2024-06-11 VITALS — Ht 64.0 in | Wt 142.0 lb

## 2024-06-11 DIAGNOSIS — Z0000 Encounter for general adult medical examination without abnormal findings: Secondary | ICD-10-CM | POA: Diagnosis not present

## 2024-06-11 NOTE — Progress Notes (Signed)
 Subjective:   Angelica Grimes is a 89 y.o. who presents for a Medicare Wellness preventive visit.  As a reminder, Annual Wellness Visits don't include a physical exam, and some assessments may be limited, especially if this visit is performed virtually. We may recommend an in-person follow-up visit with your provider if needed.  Visit Complete: Virtual I connected with  [name] on 06/11/24 by a audio enabled telemedicine application and verified that I am speaking with the correct person using two identifiers.  Patient Location: Home  Provider Location: Home Office  I discussed the limitations of evaluation and management by telemedicine. The patient expressed understanding and agreed to proceed.  Vital Signs: Because this visit was a virtual/telehealth visit, some criteria may be missing or patient reported. Any vitals not documented were not able to be obtained and vitals that have been documented are patient reported.  VideoDeclined- This patient declined Librarian, academic. Therefore the visit was completed with audio only.  Persons Participating in Visit: Patient.  AWV Questionnaire: Yes: Patient Medicare AWV questionnaire was completed by the patient on 06/07/2024; I have confirmed that all information answered by patient is correct and no changes since this date.  Cardiac Risk Factors include: advanced age (>34men, >76 women);diabetes mellitus;dyslipidemia;hypertension;sedentary lifestyle      Objective:    Today's Vitals   06/07/24 1028 06/11/24 0901  Weight:  142 lb (64.4 kg)  Height:  5' 4 (1.626 m)  PainSc: 0-No pain    Body mass index is 24.37 kg/m.     06/11/2024    9:00 AM 05/14/2023    9:39 AM 04/20/2022    9:51 AM 04/18/2021    9:46 AM 03/16/2021    2:16 PM 05/19/2019    2:40 PM 06/18/2018   10:33 AM  Advanced Directives  Does Patient Have a Medical Advance Directive? Yes Yes Yes Yes Yes No;Yes Yes   Type of Sports coach of [place];Living will Living will Healthcare Power of [place];Living will Healthcare Power of [place];Living will Healthcare Power of [place];Living will Healthcare Power of [place];Living will Healthcare Power of [place];Living will  Does patient want to make changes to medical advance directive? No - Patient declined Yes (Inpatient - patient defers changing a medical advance directive at this time - Information given) No - Patient declined  No - Patient declined  No - Patient declined   Copy of Healthcare Power of Attorney in Chart? Yes - validated most recent copy scanned in chart (See row information)  No - copy requested No - copy requested No - copy requested No - copy requested  No - copy requested   Would patient like information on creating a medical advance directive?      No - Patient declined       Data saved with a previous flowsheet row definition    Current Medications (verified) Outpatient Encounter Medications as of 06/11/2024  Medication Sig   ALPRAZolam  (XANAX ) 0.5 MG tablet TAKE ONE-HALF TO 1 TABLET BY MOUTH TWICE DAILY AS NEEDED FOR ANXIETY   co-enzyme Q-10 50 MG capsule Take 100 mg by mouth daily.   felodipine  (PLENDIL ) 10 MG 24 hr tablet Take 1 tablet (10 mg total) by mouth daily.   Glucosamine-Chondroitin (OSTEO BI-FLEX REGULAR STRENGTH PO) Take 2 tablets by mouth daily.   hydrochlorothiazide  (HYDRODIURIL ) 25 MG tablet Take 1 tablet (25 mg total) by mouth daily as needed (Leg swelling).   losartan  (COZAAR ) 50 MG tablet TAKE 1  TABLET BY MOUTH TWICE DAILY   omeprazole  (PRILOSEC) 20 MG capsule TAKE ONE CAPSULE BY MOUTH DAILY   rosuvastatin  (CRESTOR ) 5 MG tablet TAKE 1 TABLET BY MOUTH DAILY   SYNTHROID  75 MCG tablet TAKE 1 TABLET BY MOUTH DAILY   [DISCONTINUED] zinc gluconate 50 MG tablet Take 50 mg by mouth daily.   No facility-administered encounter medications on file as of 06/11/2024.    Allergies (verified) Shrimp [shellfish allergy],  Aspirin, Benazepril, Other, Penicillins, Clonidine derivatives, and Orange fruit [citrus]   History: Past Medical History:  Diagnosis Date   Anxiety    Brain tumor (benign) (HCC)    Cancer (HCC)    right breast   Cancer (HCC)    thyroid    Fracture ankle fracture 01/24/16   GERD (gastroesophageal reflux disease)    Hyperlipidemia    Hypertension    Hypothyroidism    IBS (irritable bowel syndrome)    Low iron  stores 11/08/2016   Positive colorectal cancer screening using Cologuard test 05/01/2018   Past Surgical History:  Procedure Laterality Date   breast cyst surgery      x 2 - benign    BREAST LUMPECTOMY     right breast with re-excision   BREAST SURGERY  1966, 1991, 1998   biopsy   BUNIONECTOMY  2012   right foot   COLONOSCOPY     CYSTECTOMY  1983   THYROIDECTOMY  1991   due to cancer   TONSILLECTOMY     Family History  Problem Relation Age of Onset   Stroke Mother    Pneumonia Mother    Cancer Father        bladder, prostate   Pneumonia Father    Cancer Sister        kidney   Cancer Brother        lung   Diabetes Sister    Sarcoidosis Sister    Cancer Half-Sister        kidney cancer   Cancer Half-Sister        lung cancer   Atrial fibrillation Brother    Atrial fibrillation Brother    Atrial fibrillation Brother    Colon cancer Neg Hx    Social History   Socioeconomic History   Marital status: Widowed    Spouse name: Not on file   Number of children: 2   Years of education: 63   Highest education level: 12th grade  Occupational History   Not on file  Tobacco Use   Smoking status: Never   Smokeless tobacco: Never  Vaping Use   Vaping status: Never Used  Substance and Sexual Activity   Alcohol use: Yes    Comment: rare   Drug use: No   Sexual activity: Not Currently  Other Topics Concern   Not on file  Social History Narrative   Lives with grandson      Widow   2 children   1: Angelica Grimes (has permission to get info) 2  children: 1 son and daughter (son lives with her)   2: [facility] Son-Angelica Grimes 1 son      No pets      Diet: eats what she wants   Caffeine: none,  Maybe a coke at times   Water: 3-4 bottles of 16 oz      Wear seat belt   Wear sun protection if needed   Smoke detectors and Security system    Does not use phone in car  Social Drivers of Corporate investment banker Strain: Low Risk  (06/07/2024)   Overall Financial Resource Strain (CARDIA)    Difficulty of Paying Living Expenses: Not hard at all  Food Insecurity: No Food Insecurity (06/07/2024)   Hunger Vital Sign    Worried About Running Out of Food in the Last Year: Never true    Ran Out of Food in the Last Year: Never true  Transportation Needs: No Transportation Needs (06/07/2024)   PRAPARE - Administrator, Civil Service (Medical): No    Lack of Transportation (Non-Medical): No  Physical Activity: Insufficiently Active (06/07/2024)   Exercise Vital Sign    Days of Exercise per Week: 2 days    Minutes of Exercise per Session: 20 min  Stress: No Stress Concern Present (06/07/2024)   [employer] of Occupational Health - Occupational Stress Questionnaire    Feeling of Stress: Not at all  Social Connections: Moderately Isolated (06/07/2024)   Social Connection and Isolation Panel    Frequency of Communication with Friends and Family: More than three times a week    Frequency of Social Gatherings with Friends and Family: More than three times a week    Attends Religious Services: More than 4 times per year    Active Member of [employer] or Organizations: No    Attends Banker Meetings: Never    Marital Status: Widowed    Tobacco Counseling Counseling given: Yes    Clinical Intake:  Pre-visit preparation completed: Yes  Pain : No/denies pain Pain Score: 0-No pain     BMI - recorded: 24.37 Nutritional Status: BMI of 19-24  Normal Nutritional Risks: None Diabetes: Yes CBG done?: No (telehealth  visit) Did pt. bring in CBG monitor from home?: No  Lab Results  Component Value Date   HGBA1C 6.2 (H) 05/26/2024   HGBA1C 6.2 (H) 01/08/2024   HGBA1C 6.3 (H) 08/16/2023     How often do you need to have someone help you when you read instructions, pamphlets, or other written materials from your doctor or pharmacy?: 1 - Never  Interpreter Needed?: No  Information entered by :: Owynn Mosqueda W CMA (AAMA)   Activities of Daily Living     06/07/2024   10:28 AM  In your present state of health, do you have any difficulty performing the following activities:  Hearing? 0  Vision? 0  Difficulty concentrating or making decisions? 0  Walking or climbing stairs? 1  Dressing or bathing? 0  Doing errands, shopping? 0  Preparing Food and eating ? N  Using the Toilet? N  In the past six months, have you accidently leaked urine? Y  Do you have problems with loss of bowel control? N  Managing your Medications? N  Managing your Finances? N  Housekeeping or managing your Housekeeping? N    Patient Care Team: Tobie Suzzane POUR, MD as PCP - General (Internal Medicine) Alvan Dorn FALCON, MD as Consulting Physician (Cardiology) [facility] as Consulting Physician (Dermatology) Joshua Rush, OD as Consulting Physician (Optometry)  I have updated your Care Teams any recent Medical Services you may have received from other providers in the past year.     Assessment:   This is a routine wellness examination for Ara.  Hearing/Vision screen Hearing Screening - Comments:: Patient denies any hearing difficulties.   Vision Screening - Comments:: Wears rx glasses - up to date with routine eye exams      Goals Addressed  This Visit's Progress     Remain active and independent (pt-stated)        I want to remain active and independent       Depression Screen     06/11/2024    9:09 AM 05/13/2024   10:46 AM 01/08/2024   10:12 AM 06/11/2023    9:18 AM 05/14/2023    10:46 AM 05/14/2023    9:40 AM 05/14/2023    9:38 AM  PHQ 2/9 Scores  PHQ - 2 Score 0 0 0 0 0 0 0  PHQ- 9 Score 0 0 0        Fall Risk     06/07/2024   10:28 AM 05/13/2024   10:46 AM 01/08/2024   10:12 AM 06/11/2023    9:18 AM 05/14/2023    9:39 AM  Fall Risk   Falls in the past year? 0 0 0 0 0  Number falls in past yr: 0 0 0 0 0  Injury with Fall? 0 0 0 0 0  Risk for fall due to : No Fall Risks No Fall Risks No Fall Risks  No Fall Risks  Follow up Falls evaluation completed;Education provided;Falls prevention discussed Falls evaluation completed Falls evaluation completed  Falls evaluation completed    MEDICARE RISK AT HOME:  Medicare Risk at Home Any stairs in or around the home?: (Patient-Rptd) Yes If so, are there any without handrails?: (Patient-Rptd) No Home free of loose throw rugs in walkways, pet beds, electrical cords, etc?: (Patient-Rptd) Yes Adequate lighting in your home to reduce risk of falls?: (Patient-Rptd) Yes Life alert?: (Patient-Rptd) No Use of a cane, walker or w/c?: (Patient-Rptd) No Grab bars in the bathroom?: (Patient-Rptd) Yes Shower chair or bench in shower?: (Patient-Rptd) Yes Elevated toilet seat or a handicapped toilet?: (Patient-Rptd) Yes  TIMED UP AND GO:  Was the test performed?  No  Cognitive Function: 6CIT completed    04/20/2022    9:56 AM  MMSE - Mini Mental State Exam  Not completed: Unable to complete        06/11/2024    9:09 AM 05/14/2023    9:40 AM 04/20/2022    9:56 AM 04/18/2021    9:48 AM  6CIT Screen  What Year? 0 points 0 points 0 points 0 points  What month? 0 points 0 points 0 points 0 points  What time? 0 points 0 points 0 points 0 points  Count back from 20 0 points 0 points 0 points 0 points  Months in reverse 0 points 0 points 0 points 0 points  Repeat phrase 0 points 0 points 0 points 0 points  Total Score 0 points 0 points 0 points 0 points    Immunizations Immunization History  Administered Date(s) Administered    Fluad Quad(high Dose 65+) 07/05/2020, 07/05/2022   Influenza Split 06/19/2012   Influenza,inj,Quad PF,6+ Mos 07/02/2018   Influenza,inj,quad, With Preservative 06/27/2017   Influenza-Unspecified 08/01/2015   Moderna Sars-Covid-2 Vaccination 01/07/2020, 02/03/2020   PNEUMOCOCCAL CONJUGATE-20 06/26/2023   Pneumococcal Conjugate-13 06/24/2014   Pneumococcal-Unspecified 06/25/2011   RSV,unspecified 12/03/2023   Tdap 10/24/2023   Zoster Recombinant(Shingrix) 06/27/2017, 01/29/2018    Screening Tests Health Maintenance  Topic Date Due   OPHTHALMOLOGY EXAM  Never done   COVID-19 Vaccine (3 - Moderna risk series) 03/02/2020   Influenza Vaccine  05/02/2024   FOOT EXAM  06/10/2024   MAMMOGRAM  07/29/2024   HEMOGLOBIN A1C  11/26/2024   Medicare Annual Wellness (AWV)  06/11/2025   DTaP/Tdap/Td (2 -  Td or Tdap) 10/23/2033   Pneumococcal Vaccine: 50+ Years  Completed   DEXA SCAN  Completed   Zoster Vaccines- Shingrix  Completed   HPV VACCINES  Aged Out   Meningococcal B Vaccine  Aged Out    Health Maintenance  Health Maintenance Due  Topic Date Due   OPHTHALMOLOGY EXAM  Never done   COVID-19 Vaccine (3 - Moderna risk series) 03/02/2020   Influenza Vaccine  05/02/2024   FOOT EXAM  06/10/2024   Health Maintenance Items Addressed: Routine health screenings are up to date. Patient is aware of recommended vaccines.   Additional Screening:  Vision Screening: Recommended annual ophthalmology exams for early detection of glaucoma and other disorders of the eye. Would you like a referral to an eye doctor? No    Dental Screening: Recommended annual dental exams for proper oral hygiene  Community Resource Referral / Chronic Care Management: CRR required this visit?  No   CCM required this visit?  No   Plan:    I have personally reviewed and noted the following in the patient's chart:   Medical and social history Use of alcohol, tobacco or illicit drugs  Current medications  and supplements including opioid prescriptions. Patient is not currently taking opioid prescriptions. Functional ability and status Nutritional status Physical activity Advanced directives List of other physicians Hospitalizations, surgeries, and ER visits in previous 12 months Vitals Screenings to include cognitive, depression, and falls Referrals and appointments  In addition, I have reviewed and discussed with patient certain preventive protocols, quality metrics, and best practice recommendations. A written personalized care plan for preventive services as well as general preventive health recommendations were provided to patient.   Arihanna Estabrook, CMA   06/11/2024   After Visit Summary: (MyChart) Due to this being a telephonic visit, the after visit summary with patients personalized plan was offered to patient via MyChart   Notes: Nothing significant to report at this time.

## 2024-06-11 NOTE — Patient Instructions (Addendum)
 Ms. Gayler,  Thank you for taking the time for your Medicare Wellness Visit. I appreciate your continued commitment to your health goals. Please review the care plan we discussed, and feel free to reach out if I can assist you further.  We have your Advanced Directives in your chart. It is recommended that you update those every 10 years. I will send you out a packet that you can complete. You do not have to have an attorney to complete these. After completing them, have them notarized, bring the original copy to the office so we can scan them, and we will give you back that original copy for your records.     Ongoing Care Seeing your primary care provider every 3 to 6 months helps us  monitor your health and provide consistent, personalized care.     Referrals If a referral was made during today's visit and you haven't received any updates within two weeks, please contact the referred provider directly to check on the status.  Wishing you good health and many blessings for the year to come  -Rahsaan Weakland     Medicare recommends these wellness visits once per year to help you and your care team stay ahead of potential health issues. These visits are designed to focus on prevention, allowing your provider to concentrate on managing your acute and chronic conditions during your regular appointments.     Please note that Annual Wellness Visits do not include a physical exam. Some assessments may be limited, especially if the visit was conducted virtually. If needed, we may recommend a separate in-person follow-up with your provider.  Recommended Screenings:  Health Maintenance  Topic Date Due   Eye exam for diabetics  Never done   COVID-19 Vaccine (3 - Moderna risk series) 03/02/2020   Flu Shot  05/02/2024   Complete foot exam   06/10/2024   Mammogram  07/29/2024   Hemoglobin A1C  11/26/2024   Medicare Annual Wellness Visit  06/11/2025   DTaP/Tdap/Td vaccine (2 - Td or Tdap) 10/23/2033    Pneumococcal Vaccine for age over 65  Completed   DEXA scan (bone density measurement)  Completed   Zoster (Shingles) Vaccine  Completed   HPV Vaccine  Aged Out   Meningitis B Vaccine  Aged Out       06/11/2024    9:00 AM  Advanced Directives  Does Patient Have a Medical Advance Directive? Yes  Type of Estate agent of [place];Living will  Does patient want to make changes to medical advance directive? No - Patient declined  Copy of Healthcare Power of Attorney in Chart? Yes - validated most recent copy scanned in chart (See row information)   Advance Care Planning is important because it: Ensures you receive medical care that aligns with your values, goals, and preferences. Provides guidance to your family and loved ones, reducing the emotional burden of decision-making during critical moments.    Vision: Annual vision screenings are recommended for early detection of glaucoma, cataracts, and diabetic retinopathy. These exams can also reveal signs of chronic conditions such as diabetes and high blood pressure.    Dental: Annual dental screenings help detect early signs of oral cancer, gum disease, and other conditions linked to overall health, including heart disease and diabetes.  Please see the attached documents for additional preventive care recommendations.

## 2024-07-22 ENCOUNTER — Other Ambulatory Visit: Payer: Self-pay | Admitting: Internal Medicine

## 2024-07-22 DIAGNOSIS — F418 Other specified anxiety disorders: Secondary | ICD-10-CM

## 2024-08-02 ENCOUNTER — Other Ambulatory Visit: Payer: Self-pay | Admitting: Internal Medicine

## 2024-08-02 DIAGNOSIS — I10 Essential (primary) hypertension: Secondary | ICD-10-CM

## 2024-08-02 DIAGNOSIS — E785 Hyperlipidemia, unspecified: Secondary | ICD-10-CM

## 2024-08-06 ENCOUNTER — Ambulatory Visit (HOSPITAL_COMMUNITY)

## 2024-08-08 ENCOUNTER — Other Ambulatory Visit: Payer: Self-pay | Admitting: Internal Medicine

## 2024-08-08 ENCOUNTER — Encounter: Payer: Self-pay | Admitting: Internal Medicine

## 2024-08-08 DIAGNOSIS — R7303 Prediabetes: Secondary | ICD-10-CM

## 2024-08-11 ENCOUNTER — Ambulatory Visit (HOSPITAL_COMMUNITY)
Admission: RE | Admit: 2024-08-11 | Discharge: 2024-08-11 | Disposition: A | Discharge status: Home / Self Care | Source: Ambulatory Visit | Attending: Internal Medicine | Admitting: Internal Medicine

## 2024-08-11 ENCOUNTER — Encounter (HOSPITAL_COMMUNITY): Payer: Self-pay

## 2024-08-11 DIAGNOSIS — Z1231 Encounter for screening mammogram for malignant neoplasm of breast: Secondary | ICD-10-CM | POA: Diagnosis present

## 2024-09-22 ENCOUNTER — Other Ambulatory Visit: Payer: Self-pay | Admitting: Internal Medicine

## 2024-09-22 DIAGNOSIS — F418 Other specified anxiety disorders: Secondary | ICD-10-CM

## 2024-11-05 ENCOUNTER — Other Ambulatory Visit: Payer: Self-pay | Admitting: Internal Medicine

## 2024-11-05 DIAGNOSIS — I10 Essential (primary) hypertension: Secondary | ICD-10-CM

## 2024-12-04 ENCOUNTER — Ambulatory Visit: Admitting: Internal Medicine

## 2025-06-16 ENCOUNTER — Ambulatory Visit
